# Patient Record
Sex: Female | Born: 1970 | Smoking: Never smoker
Health system: Southern US, Community
[De-identification: ages and names within clinical notes are randomized; demographics above are authoritative.]

## PROBLEM LIST (undated history)

## (undated) DIAGNOSIS — R569 Unspecified convulsions: Secondary | ICD-10-CM

## (undated) DIAGNOSIS — E119 Type 2 diabetes mellitus without complications: Secondary | ICD-10-CM

## (undated) DIAGNOSIS — I10 Essential (primary) hypertension: Secondary | ICD-10-CM

## (undated) DIAGNOSIS — E785 Hyperlipidemia, unspecified: Secondary | ICD-10-CM

## (undated) HISTORY — DX: Essential (primary) hypertension: I10

## (undated) HISTORY — PX: CHOLECYSTECTOMY: SHX55

## (undated) HISTORY — PX: TUBAL LIGATION: SHX77

## (undated) HISTORY — DX: Hyperlipidemia, unspecified: E78.5

## (undated) HISTORY — DX: Unspecified convulsions: R56.9

## (undated) HISTORY — DX: Type 2 diabetes mellitus without complications: E11.9

---

## 2009-06-14 HISTORY — PX: ESOPHAGOGASTRODUODENOSCOPY: SHX1529

## 2016-05-22 ENCOUNTER — Emergency Department: Payer: Medicare Other

## 2016-05-22 ENCOUNTER — Emergency Department
Admission: EM | Admit: 2016-05-22 | Discharge: 2016-05-22 | Disposition: A | Discharge status: Home / Self Care | Payer: Medicare Other | Attending: Emergency Medicine | Admitting: Emergency Medicine

## 2016-05-22 ENCOUNTER — Emergency Department: Payer: Self-pay

## 2016-05-22 DIAGNOSIS — E119 Type 2 diabetes mellitus without complications: Secondary | ICD-10-CM | POA: Insufficient documentation

## 2016-05-22 DIAGNOSIS — S39012A Strain of muscle, fascia and tendon of lower back, initial encounter: Secondary | ICD-10-CM | POA: Insufficient documentation

## 2016-05-22 DIAGNOSIS — S161XXA Strain of muscle, fascia and tendon at neck level, initial encounter: Secondary | ICD-10-CM | POA: Insufficient documentation

## 2016-05-22 DIAGNOSIS — S5011XA Contusion of right forearm, initial encounter: Secondary | ICD-10-CM | POA: Insufficient documentation

## 2016-05-22 DIAGNOSIS — S40021A Contusion of right upper arm, initial encounter: Secondary | ICD-10-CM

## 2016-05-22 DIAGNOSIS — Z7984 Long term (current) use of oral hypoglycemic drugs: Secondary | ICD-10-CM | POA: Insufficient documentation

## 2016-05-22 MED ORDER — KETOROLAC TROMETHAMINE 30 MG/ML IJ SOLN
60.0000 mg | Freq: Once | INTRAMUSCULAR | Status: AC
Start: 2016-05-22 — End: 2016-05-22
  Administered 2016-05-22: 60 mg via INTRAMUSCULAR
  Filled 2016-05-22: qty 2

## 2016-05-22 MED ORDER — CYCLOBENZAPRINE HCL 10 MG PO TABS
10.0000 mg | ORAL_TABLET | Freq: Every evening | ORAL | Status: AC | PRN
Start: 2016-05-22 — End: 2016-06-06

## 2016-05-22 MED ORDER — IBUPROFEN 600 MG PO TABS
600.0000 mg | ORAL_TABLET | Freq: Four times a day (QID) | ORAL | Status: AC | PRN
Start: 2016-05-22 — End: ?

## 2016-05-22 NOTE — Discharge Instructions (Signed)
Dear Ms. Crawshaw:    Thank you for choosing the [facility] Emergency Department, the premier emergency department in the [place] area.  I hope your visit today was EXCELLENT.    Specific instructions for your visit today:    Continue with pain medication and follow up with primary care provider.    Cervical Strain    You have been diagnosed with a neck strain, also called a cervical strain.    The cervical spine is between the base of the skull and the top of the shoulders.    A strain happens when a muscle is stretched, torn or injured. The pain that you feel is caused by inflammation (swelling) or bruising in the muscle. A strain is not the same as a sprain. A sprain is an injury to a ligament that holds bones together.    A cervical strain occurs when the head snaps forward during an accident or a fall. The muscles can easily be strained with this type of movement. It is normal to experience pain over the muscles around the neck but not over the bones of the cervical spine.    The x-rays of your neck showed no evidence of broken bones.    Apply a warm damp washcloth to the neck for 20 minutes at a time, at least 4 times per day. This will reduce your pain. Massaging your neck might also help.    It is normal to feel stiffness and pain in your neck after a strain. This pain may last for the next few days. If your pain stays about the same or gets better, you probably do not need to see a doctor. However, if your symptoms get worse or you have new symptoms, you should return here or go to the nearest Emergency Department.    Call your physician or go to the nearest Emergency Department if your pain does not improve within 4 weeks or your pain is bad enough to seriously limit your normal activities.    YOU SHOULD SEEK MEDICAL ATTENTION IMMEDIATELY, EITHER HERE OR AT THE NEAREST EMERGENCY DEPARTMENT, IF ANY OF THE FOLLOWING OCCURS:   Your arms and legs tingle or get numb (lose feeling).   Your  arms or legs are weak.   You feel that your neck is unstable.   You lose control of your bladder or bowels. If this were to happen, it may cause you to wet or soil yourself. Some people may actually have problems urinating instead.   Your pain gets worse.            Lumbosacral Strain    You have been diagnosed with a lumbosacral strain.    The lumbosacral area is also called the low back.    A strain happens when a muscle is stretched, torn or injured. The pain that you feel is caused by inflammation (swelling) or bruising in the muscle. A strain is not the same as a sprain. A sprain is an injury to a ligament that holds bones together.    A lumbosacral (low back) strain occurs when twisting, bending or lifting tears the muscle. This leads to stiffness and pain. It is common to experience pain over the muscles around the lower spine but not over the bones. The doctor will usually able to tell where you are tender. A low-back strain is different from a more serious condition called a herniated disk (slipped disk).    The doctor DID NOT take x-rays because your  pain was not over the bones (vertebrae) in your back. You may have had pain in the MUSCLES that surround the backbones. A fracture is very unlikely when there is no pain over the bones.    The following suggestions may help to ease the pain in your low back:   Apply a warm wet towel to the injured area for 20 minutes at a time, at least 4 times per day.   Gently massage the injured muscles to relax them and ease the pain.   Avoid any heavy lifting or repeated bending. You can resume normal daily activities as long as they do not make your pain worse.    It is common to feel stiffness and pain in the back after a strain injury. This pain may last for the next few days. You do not need to return here for this type of pain. However, you should watch for a significant change or worsening of your symptoms. Use the following as a guideline for when to  return here or go to the nearest Emergency Department:    YOU SHOULD SEEK MEDICAL ATTENTION IMMEDIATELY, EITHER HERE OR AT THE NEAREST EMERGENCY DEPARTMENT, IF ANY OF THE FOLLOWING OCCURS:   You have loss of feeling or tingling in your legs.   Your legs feel weak.   You cannot control your bowels or bladder (you soil or wet yourself).   You pain becomes more severe.   Your pain does not improve within 4 weeks or is severe enough to seriously limit your normal activities.            Contusion    You have been diagnosed with a contusion.    A contusion is a bruise. A contusion occurs when something strikes or hits the body. This breaks small blood vessels called capillaries. When the capillaries break, blood leaks out. This makes the skin look red, purple, blue, or black. The injured area may hurt for a few days. If you take a blood thinner like warfarin (Coumadin) the bruising may be worse.    Apply ice to the bruise. Avoid using the injured body part.    Apply ice to help with pain and swelling. Put some ice cubes in a re-sealable plastic bag (like Ziploc). Add some water. Seal the bag. Put a thin washcloth between the bag and the skin. Apply the ice bag for at least 20 minutes. Do this at least 4 times per day. It's okay to apply ice longer or more often. NEVER APPLY ICE DIRECTLY TO THE SKIN. Always keep a washcloth between the ice pack and your body.    YOU SHOULD SEEK MEDICAL ATTENTION IMMEDIATELY, EITHER HERE OR AT THE NEAREST EMERGENCY DEPARTMENT, IF ANY OF THE FOLLOWING OCCURS:   Your pain or swelling gets much worse.   You develop new numbness or tingling in or below the affected area.   Your foot or hand looks cold or pale. This could mean there is a problem with circulation (blood supply).                If you do not continue to improve or your condition worsens, please contact your doctor or return immediately to the Emergency Department.    Sincerely,  Aminzay, Kathrynn Speed, MD - Attending  Emergency Physician  Doristine Mango, PA-C - Physician Assistant  [facility] Emergency Department    ONSITE PHARMACY  Our full service onsite pharmacy is located in the ER waiting room.  Open  7 days a week from 9 am to 11 pm.  We accept all major insurances and prices are competitive with major retailers.  Ask your provider to print your prescriptions down to the pharmacy to speed you on your way home.    OBTAINING A PRIMARY CARE APPOINTMENT    Primary care physicians (PCPs, also known as primary care doctors) are either internists or family medicine doctors. Both types of PCPs focus on health promotion, disease prevention, patient education and counseling, and treatment of acute and chronic medical conditions.    Call for an appointment with a primary care doctor.  Ask to see who is taking new patients.     [facility]  telephone:  [phone]  [email or web address]    DOCTOR REFERRALS  Call [phone] (available 24 hours a day, 7 days a week) if you need any further referrals and we can help you find a primary care doctor or specialist.  Also, available online at:  [email or web address]    YOUR CONTACT INFORMATION  Before leaving please check with registration to make sure we have an up-to-date contact number.  You can call registration at [phone] to update your information.  For questions about your hospital bill, please call [phone].  For questions about your Emergency Dept Physician bill please call [phone].      FREE HEALTH SERVICES  If you need help with health or social services, please call 2-1-1 for a free referral to resources in your area.  2-1-1 is a free service connecting people with information on health insurance, free clinics, pregnancy, mental health, dental care, food assistance, housing, and substance abuse counseling.  Also, available online at:  http://www.[facility].org    MEDICAL RECORDS AND TESTS  Certain laboratory test results do  not come back the same day, for example urine cultures.   We will contact you if other important findings are noted.  Radiology films are often reviewed again to ensure accuracy.  If there is any discrepancy, we will notify you.      Please call [phone] to pick up a complimentary CD of any radiology studies performed.  If you or your doctor would like to request a copy of your medical records, please call [phone].      ORTHOPEDIC INJURY   Please know that significant injuries can exist even when an initial x-ray is read as normal or negative.  This can occur because some fractures (broken bones) are not initially visible on x-rays.  For this reason, close outpatient follow-up with your primary care doctor or bone specialist (orthopedist) is required.    MEDICATIONS AND FOLLOWUP  Please be aware that some prescription medications can cause drowsiness.  Use caution when driving or operating machinery.    The examination and treatment you have received in our Emergency Department is provided on an emergency basis, and is not intended to be a substitute for your primary care physician.  It is important that your doctor checks you again and that you report any new or remaining problems at that time.      24 HOUR PHARMACIES  The nearest 24 hour pharmacy is:    [facility]  [address]  [place], [place] [address]  [phone]      ASSISTANCE WITH INSURANCE    Affordable Care Act  [facility])  Call to start or finish an application, compare plans, enroll or ask a question.  [phone]  TTY: [phone]  Web:  Healthcare.gov    Help Enrolling  in [facility]  [employer] [place]  ([phone] 698-1492 (TOLL-FREE)  ([phone] (TTY)  Web:  Http://www.coverva.org    Local Help Enrolling in the [facility]  [place] [facility]  [phone] (MAIN)  Email:  health-help@nvfs .org  Web:  BlackjackMyths.is  Address:  [address], [address] [place], [place] [address]    SEDATING MEDICATIONS  Sedating  medications include strong pain medications (e.g. narcotics), muscle relaxers, benzodiazepines (used for anxiety and as muscle relaxers), Benadryl/diphenhydramine and other antihistamines for allergic reactions/itching, and other medications.  If you are unsure if you have received a sedating medication, please ask your physician or nurse.  If you received a sedating medication: DO NOT drive a car. DO NOT operate machinery. DO NOT perform jobs where you need to be alert.  DO NOT drink alcoholic beverages while taking this medicine.     If you get dizzy, sit or lie down at the first signs. Be careful going up and down stairs.  Be extra careful to prevent falls.     Never give this medicine to others.     Keep this medicine out of reach of children.     Do not take or save old medicines. Throw them away when outdated.     Keep all medicines in a cool, dry place. DO NOT keep them in your bathroom medicine cabinet or in a cabinet above the stove.    MEDICATION REFILLS  Please be aware that we cannot refill any prescriptions through the ER. If you need further treatment from what is provided at your ER visit, please follow up with your primary care doctor or your pain management specialist.    FREESTANDING EMERGENCY DEPARTMENTS OF [facility]  Did you know [facility] has two freestanding ERs located just a few miles away?  [facility] and [facility] [place] have short wait times, easy free parking directly in front of the building and top patient satisfaction scores - and the same Board Certified Emergency Medicine doctors as [facility].              Alaiya Martindelcampo Doke  [id number]  [id number]  [phone]  05/22/2016    Discharge Instructions    As always, you are the most important factor in your recovery.  Please follow these instructions carefully.  If you have problems that we have not discussed, CALL OR VISIT YOUR DOCTOR RIGHT AWAY.     If you can't reach your doctor, return to the  emergency department.    I Iran Ouch understand the written and discussed instructions.  My questions have been answered.  I acknowledge receipt of these instructions.     Patient or responsible person:         Patient's Signature               Physician or Nurse

## 2016-05-22 NOTE — ED Notes (Signed)
Bed: N T  Expected date:   Expected time:   Means of arrival: [id number] [place]  Comments:
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
## 2017-01-24 DIAGNOSIS — R05 Cough: Secondary | ICD-10-CM | POA: Diagnosis not present

## 2017-01-24 DIAGNOSIS — J069 Acute upper respiratory infection, unspecified: Secondary | ICD-10-CM | POA: Diagnosis not present

## 2017-01-24 DIAGNOSIS — R509 Fever, unspecified: Secondary | ICD-10-CM | POA: Diagnosis not present

## 2017-07-21 ENCOUNTER — Encounter (HOSPITAL_COMMUNITY): Payer: Self-pay | Admitting: Emergency Medicine

## 2017-07-21 ENCOUNTER — Emergency Department (HOSPITAL_COMMUNITY)
Admission: EM | Admit: 2017-07-21 | Discharge: 2017-07-21 | Disposition: A | Discharge status: Home / Self Care | Payer: Medicare Other | Attending: Emergency Medicine | Admitting: Emergency Medicine

## 2017-07-21 ENCOUNTER — Emergency Department (HOSPITAL_COMMUNITY): Payer: Medicare Other

## 2017-07-21 DIAGNOSIS — H60502 Unspecified acute noninfective otitis externa, left ear: Secondary | ICD-10-CM | POA: Diagnosis not present

## 2017-07-21 DIAGNOSIS — R112 Nausea with vomiting, unspecified: Secondary | ICD-10-CM | POA: Diagnosis not present

## 2017-07-21 DIAGNOSIS — R197 Diarrhea, unspecified: Secondary | ICD-10-CM | POA: Insufficient documentation

## 2017-07-21 DIAGNOSIS — H6692 Otitis media, unspecified, left ear: Secondary | ICD-10-CM | POA: Diagnosis not present

## 2017-07-21 DIAGNOSIS — H6092 Unspecified otitis externa, left ear: Secondary | ICD-10-CM | POA: Diagnosis not present

## 2017-07-21 LAB — COMPREHENSIVE METABOLIC PANEL
ALT: 21 U/L (ref 14–54)
AST: 22 U/L (ref 15–41)
Albumin: 3.5 g/dL (ref 3.5–5.0)
Alkaline Phosphatase: 69 U/L (ref 38–126)
Anion gap: 9 (ref 5–15)
BILIRUBIN TOTAL: 0.7 mg/dL (ref 0.3–1.2)
BUN: 7 mg/dL (ref 6–20)
CALCIUM: 9.3 mg/dL (ref 8.9–10.3)
CO2: 29 mmol/L (ref 22–32)
CREATININE: 0.78 mg/dL (ref 0.44–1.00)
Chloride: 102 mmol/L (ref 101–111)
GFR calc Af Amer: >60 mL/min (ref >60)
GFR calc non Af Amer: >60 mL/min (ref >60)
GLUCOSE: 134 mg/dL — AB (ref 65–99)
Potassium: 3.6 mmol/L (ref 3.5–5.1)
SODIUM: 140 mmol/L (ref 135–145)
TOTAL PROTEIN: 7.4 g/dL (ref 6.5–8.1)

## 2017-07-21 LAB — CBC
HEMATOCRIT: 38.7 % (ref 36.0–46.0)
Hemoglobin: 12.5 g/dL (ref 12.0–15.0)
MCH: 27.6 pg (ref 26.0–34.0)
MCHC: 32.3 g/dL (ref 30.0–36.0)
MCV: 85.4 fL (ref 78.0–100.0)
Platelets: 175 10*3/uL (ref 150–400)
RBC: 4.53 MIL/uL (ref 3.87–5.11)
RDW: 15.6 % — AB (ref 11.5–15.5)
WBC: 9 10*3/uL (ref 4.0–10.5)

## 2017-07-21 LAB — URINALYSIS, ROUTINE W REFLEX MICROSCOPIC
BILIRUBIN URINE: NEGATIVE
Glucose, UA: NEGATIVE mg/dL
Hgb urine dipstick: NEGATIVE
KETONES UR: NEGATIVE mg/dL
Leukocytes, UA: NEGATIVE
NITRITE: NEGATIVE
PH: 7 (ref 5.0–8.0)
Protein, ur: NEGATIVE mg/dL
Specific Gravity, Urine: 1.024 (ref 1.005–1.030)

## 2017-07-21 LAB — PREGNANCY, URINE: Preg Test, Ur: NEGATIVE

## 2017-07-21 LAB — LIPASE, BLOOD: LIPASE: 25 U/L (ref 11–51)

## 2017-07-21 MED ORDER — OFLOXACIN 0.3 % OT SOLN: 10.0000 [drp] | Freq: Every day | OTIC | 0 refills | Status: AC

## 2017-07-21 MED ORDER — PROMETHAZINE HCL 25 MG PO TABS: 25.0000 mg | ORAL_TABLET | Freq: Four times a day (QID) | ORAL | 0 refills | Status: DC | PRN

## 2017-07-21 MED ORDER — AZITHROMYCIN 250 MG PO TABS: ORAL_TABLET | ORAL | 0 refills | Status: DC

## 2017-07-21 MED ORDER — ONDANSETRON 8 MG PO TBDP
8.0000 mg | ORAL_TABLET | Freq: Once | ORAL | Status: AC
  Administered 2017-07-21: 8 mg via ORAL
  Filled 2017-07-21: qty 1

## 2017-07-21 NOTE — ED Provider Notes (Signed)
[facility]-EMERGENCY DEPT Provider Note   CSN: [id number] Arrival date & time: 07/21/17  1318     History   Chief Complaint Chief Complaint  Patient presents with  . Diarrhea  . Emesis    HPI Paula Butler is a 46 y.o. female.  HPI  Pt was seen at 1525.  Per pt, c/o gradual onset and persistence of multiple intermittent episodes of N/V/D that began 3 to 4 days ago.   Describes the stools as "watery."  Pt also c/o gradual onset and persistence of constant L>R ear "pain" that began 3 days ago "after getting wet in the rain."  Denies abd pain, no CP/SOB, no back pain, no fevers, no black or blood in stools or emesis, no sore throat, no injury, no ear drainage, no recent travel, no others in household with similar symptoms.     History reviewed. No pertinent past medical history.  There are no active problems to display for this patient.   Past Surgical History:  Procedure Laterality Date  . CHOLECYSTECTOMY    . TUBAL LIGATION      OB History    No data available       Home Medications    Prior to Admission medications   Not on File    Family History History reviewed. No pertinent family history.  Social History Social History  Substance Use Topics  . Smoking status: Never Smoker  . Smokeless tobacco: Not on file  . Alcohol use No     Allergies   Ampicillin and Asa [aspirin]   Review of Systems Review of Systems ROS: Statement: All systems negative except as marked or noted in the HPI; Constitutional: Negative for fever and chills. ; ; Eyes: Negative for eye pain, redness and discharge. ; ; ENMT: +L>R ear pain. Negative for hoarseness, nasal congestion, sinus pressure and sore throat. ; ; Cardiovascular: Negative for chest pain, palpitations, diaphoresis, dyspnea and peripheral edema. ; ; Respiratory: Negative for cough, wheezing and stridor. ; ; Gastrointestinal: +N/V/D. Negative for abdominal pain, blood in stool, hematemesis, jaundice and rectal bleeding. .  ; ; Genitourinary: Negative for dysuria, flank pain and hematuria. ; ; Musculoskeletal: Negative for back pain and neck pain. Negative for swelling and trauma.; ; Skin: Negative for pruritus, rash, abrasions, blisters, bruising and skin lesion.; ; Neuro: Negative for headache, lightheadedness and neck stiffness. Negative for weakness, altered level of consciousness, altered mental status, extremity weakness, paresthesias, involuntary movement, seizure and syncope.       Physical Exam Updated Vital Signs BP (!) 110/92 (BP Location: Right Arm)   Pulse 91   Temp 98 F (36.7 C) (Oral)   Resp 18   Ht 5\' 5"  (1.651 m)   Wt 113.4 kg (250 lb)   LMP 07/07/2017 (Exact Date)   SpO2 94%   BMI 41.60 kg/m   Physical Exam 1530: Physical examination:  Nursing notes reviewed; Vital signs and O2 SAT reviewed;  Constitutional: Well developed, Well nourished, Well hydrated, In no acute distress; Head:  Normocephalic, atraumatic; Eyes: EOMI, PERRL, No scleral icterus; ENMT:  Right TM clear. Left TM erythematous, intact, small amount of debris in canal, no open wounds, no blisters, no bleeding. No mastoid tenderness bilat. +edemetous nasal turbinates bilat with clear rhinorrhea. Mouth and pharynx without lesions. No tonsillar exudates. No intra-oral edema. No submandibular or sublingual edema. No hoarse voice, no drooling, no stridor. No pain with manipulation of larynx. No trismus.  Mouth and pharynx normal, Mucous membranes moist; Neck: Supple, Full  range of motion, No lymphadenopathy; Cardiovascular: Regular rate and rhythm, No gallop; Respiratory: Breath sounds clear & equal bilaterally, No wheezes.  Speaking full sentences with ease, Normal respiratory effort/excursion; Chest: Nontender, Movement normal; Abdomen: Soft, Nontender, Nondistended, Normal bowel sounds; Genitourinary: No CVA tenderness; Extremities: Pulses normal, No tenderness, No edema, No calf edema or asymmetry.; Neuro: AA&Ox3, Major CN grossly  intact.  Speech clear. No gross focal motor or sensory deficits in extremities. Climbs on and off stretcher easily by herself. Gait steady.; Skin: Color normal, Warm, Dry.    ED Treatments / Results  Labs (all labs ordered are listed, but only abnormal results are displayed)   EKG  EKG Interpretation None       Radiology   Procedures Procedures (including critical care time)  Medications Ordered in ED Medications  ondansetron (ZOFRAN-ODT) disintegrating tablet 8 mg (8 mg Oral Given 07/21/17 1536)     Initial Impression / Assessment and Plan / ED Course  I have reviewed the triage vital signs and the nursing notes.  Pertinent labs & imaging results that were available during my care of the patient were reviewed by me and considered in my medical decision making (see chart for details).  MDM Reviewed: previous chart, nursing note and vitals Reviewed previous: labs Interpretation: labs and x-ray   Results for orders placed or performed during the hospital encounter of 07/21/17  Lipase, blood  Result Value Ref Range   Lipase 25 11 - 51 U/L  Comprehensive metabolic panel  Result Value Ref Range   Sodium 140 135 - 145 mmol/L   Potassium 3.6 3.5 - 5.1 mmol/L   Chloride 102 101 - 111 mmol/L   CO2 29 22 - 32 mmol/L   Glucose, Bld 134 (H) 65 - 99 mg/dL   BUN 7 6 - 20 mg/dL   Creatinine, Ser 9.140.78 0.44 - 1.00 mg/dL   Calcium 9.3 8.9 - 78.210.3 mg/dL   Total Protein 7.4 6.5 - 8.1 g/dL   Albumin 3.5 3.5 - 5.0 g/dL   AST 22 15 - 41 U/L   ALT 21 14 - 54 U/L   Alkaline Phosphatase 69 38 - 126 U/L   Total Bilirubin 0.7 0.3 - 1.2 mg/dL   GFR calc non Af Amer >60 >60 mL/min   GFR calc Af Amer >60 >60 mL/min   Anion gap 9 5 - 15  CBC  Result Value Ref Range   WBC 9.0 4.0 - 10.5 K/uL   RBC 4.53 3.87 - 5.11 MIL/uL   Hemoglobin 12.5 12.0 - 15.0 g/dL   HCT 95.638.7 21.336.0 - 08.646.0 %   MCV 85.4 78.0 - 100.0 fL   MCH 27.6 26.0 - 34.0 pg   MCHC 32.3 30.0 - 36.0 g/dL   RDW 57.815.6 (H) 46.911.5 -  15.5 %   Platelets 175 150 - 400 K/uL  Urinalysis, Routine w reflex microscopic  Result Value Ref Range   Color, Urine YELLOW YELLOW   APPearance HAZY (A) CLEAR   Specific Gravity, Urine 1.024 1.005 - 1.030   pH 7.0 5.0 - 8.0   Glucose, UA NEGATIVE NEGATIVE mg/dL   Hgb urine dipstick NEGATIVE NEGATIVE   Bilirubin Urine NEGATIVE NEGATIVE   Ketones, ur NEGATIVE NEGATIVE mg/dL   Protein, ur NEGATIVE NEGATIVE mg/dL   Nitrite NEGATIVE NEGATIVE   Leukocytes, UA NEGATIVE NEGATIVE  Pregnancy, urine  Result Value Ref Range   Preg Test, Ur NEGATIVE NEGATIVE   Dg Abd Acute W/chest Result Date: 07/21/2017 CLINICAL DATA:  Nausea,  vomiting and diarrhea for 4 days. History of cholecystectomy. EXAM: DG ABDOMEN ACUTE W/ 1V CHEST COMPARISON:  None. FINDINGS: Cardiomediastinal silhouette is normal. Lungs are clear, no pleural effusions. No pneumothorax. Soft tissue planes and included osseous structures are unremarkable. Bowel gas pattern is nondilated and nonobstructive. Surgical clips in the included right abdomen compatible with cholecystectomy. No intra-abdominal mass effect, pathologic calcifications or free air. Phleboliths project in the pelvis. Soft tissue planes and included osseous structures are non-suspicious. IMPRESSION: No acute cardiopulmonary process. Normal bowel gas pattern. Electronically Signed   By: Awilda Metro M.D.   On: 07/21/2017 16:49    1820:  Pt has tol PO well while in the ED without N/V.  Has ambulated with steady gait. Abd remains benign, VSS. Feels better and wants to go home now. Workup reassuring. Tx for AOM, external otitis, as well as symptomatically for N/V/D. Dx and testing d/w pt and family.  Questions answered.  Verb understanding, agreeable to d/c home with outpt f/u.    Final Clinical Impressions(s) / ED Diagnoses   Final diagnoses:  None    New Prescriptions New Prescriptions   No medications on file     Samuel Jester, DO 07/25/17 2130

## 2017-07-21 NOTE — Discharge Instructions (Signed)
Take the prescriptions as directed.  Take over the counter tylenol and ibuprofen, as directed on packaging, as needed for discomfort.  Increase your fluid intake (ie:  Gatoraide) for the next few days.  Eat a bland diet and advance to your regular diet slowly as you can tolerate it.   Avoid full strength juices, as well as milk and milk products until your diarrhea has resolved.   Call your regular medical doctor tomorrow to schedule a follow up appointment this week.  Return to the Emergency Department immediately sooner if worsening.

## 2017-07-21 NOTE — ED Triage Notes (Signed)
Pt reports n/v/d and bilateral ear pain x3 days.  Denies blood in stool, abd pain, or urinary symptoms.

## 2017-07-21 NOTE — ED Notes (Signed)
Pt. Tolerated PO challenge well. Pt. States she went to the bathroom and had diarrhea X1.

## 2017-10-03 ENCOUNTER — Emergency Department (HOSPITAL_COMMUNITY): Payer: Medicare Other

## 2017-10-03 ENCOUNTER — Other Ambulatory Visit: Payer: Self-pay

## 2017-10-03 ENCOUNTER — Encounter (HOSPITAL_COMMUNITY): Payer: Self-pay | Admitting: Emergency Medicine

## 2017-10-03 ENCOUNTER — Emergency Department (HOSPITAL_COMMUNITY)
Admission: EM | Admit: 2017-10-03 | Discharge: 2017-10-03 | Disposition: A | Discharge status: Home / Self Care | Payer: Medicare Other | Attending: Emergency Medicine | Admitting: Emergency Medicine

## 2017-10-03 DIAGNOSIS — R1110 Vomiting, unspecified: Secondary | ICD-10-CM | POA: Diagnosis not present

## 2017-10-03 DIAGNOSIS — R0602 Shortness of breath: Secondary | ICD-10-CM | POA: Insufficient documentation

## 2017-10-03 DIAGNOSIS — E119 Type 2 diabetes mellitus without complications: Secondary | ICD-10-CM | POA: Insufficient documentation

## 2017-10-03 DIAGNOSIS — R0981 Nasal congestion: Secondary | ICD-10-CM | POA: Insufficient documentation

## 2017-10-03 DIAGNOSIS — M549 Dorsalgia, unspecified: Secondary | ICD-10-CM | POA: Diagnosis not present

## 2017-10-03 DIAGNOSIS — Z7984 Long term (current) use of oral hypoglycemic drugs: Secondary | ICD-10-CM | POA: Insufficient documentation

## 2017-10-03 DIAGNOSIS — J40 Bronchitis, not specified as acute or chronic: Secondary | ICD-10-CM | POA: Insufficient documentation

## 2017-10-03 DIAGNOSIS — R05 Cough: Secondary | ICD-10-CM | POA: Diagnosis not present

## 2017-10-03 DIAGNOSIS — J069 Acute upper respiratory infection, unspecified: Secondary | ICD-10-CM | POA: Insufficient documentation

## 2017-10-03 HISTORY — DX: Type 2 diabetes mellitus without complications: E11.9

## 2017-10-03 LAB — CBC WITH DIFFERENTIAL/PLATELET
BASOS ABS: 0 10*3/uL (ref 0.0–0.1)
BASOS PCT: 0 %
Eosinophils Absolute: 0.4 10*3/uL (ref 0.0–0.7)
Eosinophils Relative: 5 %
HEMATOCRIT: 39.2 % (ref 36.0–46.0)
HEMOGLOBIN: 12.7 g/dL (ref 12.0–15.0)
LYMPHS PCT: 18 %
Lymphs Abs: 1.5 10*3/uL (ref 0.7–4.0)
MCH: 28 pg (ref 26.0–34.0)
MCHC: 32.4 g/dL (ref 30.0–36.0)
MCV: 86.3 fL (ref 78.0–100.0)
MONOS PCT: 8 %
Monocytes Absolute: 0.6 10*3/uL (ref 0.1–1.0)
NEUTROS ABS: 5.4 10*3/uL (ref 1.7–7.7)
NEUTROS PCT: 69 %
Platelets: 150 10*3/uL (ref 150–400)
RBC: 4.54 MIL/uL (ref 3.87–5.11)
RDW: 15.7 % — ABNORMAL HIGH (ref 11.5–15.5)
WBC: 8 10*3/uL (ref 4.0–10.5)

## 2017-10-03 LAB — BASIC METABOLIC PANEL
ANION GAP: 9 (ref 5–15)
BUN: 14 mg/dL (ref 6–20)
CO2: 26 mmol/L (ref 22–32)
Calcium: 9.1 mg/dL (ref 8.9–10.3)
Chloride: 104 mmol/L (ref 101–111)
Creatinine, Ser: 0.81 mg/dL (ref 0.44–1.00)
Glucose, Bld: 99 mg/dL (ref 65–99)
POTASSIUM: 4 mmol/L (ref 3.5–5.1)
Sodium: 139 mmol/L (ref 135–145)

## 2017-10-03 LAB — URINALYSIS, ROUTINE W REFLEX MICROSCOPIC
Bilirubin Urine: NEGATIVE
Glucose, UA: NEGATIVE mg/dL
Hgb urine dipstick: NEGATIVE
Ketones, ur: NEGATIVE mg/dL
LEUKOCYTES UA: NEGATIVE
NITRITE: NEGATIVE
PROTEIN: NEGATIVE mg/dL
Specific Gravity, Urine: 1.027 (ref 1.005–1.030)
pH: 5 (ref 5.0–8.0)

## 2017-10-03 LAB — PREGNANCY, URINE: PREG TEST UR: NEGATIVE

## 2017-10-03 MED ORDER — ALBUTEROL SULFATE HFA 108 (90 BASE) MCG/ACT IN AERS
2.0000 | INHALATION_SPRAY | Freq: Once | RESPIRATORY_TRACT | Status: AC
  Administered 2017-10-03: 2 via RESPIRATORY_TRACT
  Filled 2017-10-03: qty 6.7

## 2017-10-03 MED ORDER — LORATADINE-PSEUDOEPHEDRINE ER 5-120 MG PO TB12: 1.0000 | ORAL_TABLET | Freq: Two times a day (BID) | ORAL | 0 refills | Status: DC

## 2017-10-03 MED ORDER — HYDROCOD POLST-CPM POLST ER 10-8 MG/5ML PO SUER
5.0000 mL | Freq: Once | ORAL | Status: AC
  Administered 2017-10-03: 5 mL via ORAL
  Filled 2017-10-03: qty 5

## 2017-10-03 MED ORDER — HYDROCODONE-HOMATROPINE 5-1.5 MG/5ML PO SYRP: 5.0000 mL | ORAL_SOLUTION | Freq: Four times a day (QID) | ORAL | 0 refills | Status: DC | PRN

## 2017-10-03 MED ORDER — METHYLPREDNISOLONE SODIUM SUCC 125 MG IJ SOLR
80.0000 mg | Freq: Once | INTRAMUSCULAR | Status: AC
  Administered 2017-10-03: 80 mg via INTRAMUSCULAR
  Filled 2017-10-03: qty 2

## 2017-10-03 MED ORDER — ONDANSETRON 4 MG PO TBDP: 4.0000 mg | ORAL_TABLET | Freq: Four times a day (QID) | ORAL | 0 refills | Status: DC | PRN

## 2017-10-03 NOTE — ED Provider Notes (Signed)
[facility] EMERGENCY DEPARTMENT Provider Note   CSN: [id number] Arrival date & time: 10/03/17  1254     History   Chief Complaint Chief Complaint  Patient presents with  . Multiple Complaints    HPI Paula Butler is a 46 y.o. female.  Patient is a 47 year old female who presents to the emergency department with a complaint of cough, congestion, and back pain.  The patient states that over the last 4 days she's been noticing increasing congestion in her chest. She has congestion in her nasal passages. She states that at night she has hard breathing. She also complains of pain now in her back. She has not injured a temperature elevation. She's not had any hemoptysis reported. There's been no injury or trauma to the chest or lung area. No operations or procedures. The patient states she has been vomiting 3-4 times daily. There's been no diarrhea and no unusual rash. Patient states she has not been out of the country recently. She presents now for assistance with this issue.      Past Medical History:  Diagnosis Date  . Diabetes mellitus without complication (HCC)     There are no active problems to display for this patient.   Past Surgical History:  Procedure Laterality Date  . CHOLECYSTECTOMY    . TUBAL LIGATION      OB History    No data available       Home Medications    Prior to Admission medications   Medication Sig Start Date End Date Taking? Authorizing Provider  acetaminophen (TYLENOL) 500 MG tablet Take 500 mg by mouth every 6 (six) hours as needed for mild pain or moderate pain.    [provider]  azithromycin (ZITHROMAX) 250 MG tablet Take 2 tablets PO day 1, then 1 tab PO daily x4 days. 07/21/17   Samuel Jester, DO  metFORMIN (GLUCOPHAGE) 500 MG tablet Take 500 mg by mouth 2 (two) times daily with a meal.    [provider]  promethazine (PHENERGAN) 25 MG tablet Take 1 tablet (25 mg total) by mouth every 6 (six) hours as needed  for nausea or vomiting. 07/21/17   Samuel Jester, DO    Family History History reviewed. No pertinent family history.  Social History Social History  Substance Use Topics  . Smoking status: Never Smoker  . Smokeless tobacco: Never Used  . Alcohol use No     Allergies   Asa [aspirin] and Penicillins   Review of Systems Review of Systems  Constitutional: Negative for activity change and fever.       All ROS Neg except as noted in HPI  HENT: Positive for congestion. Negative for nosebleeds.   Eyes: Negative for photophobia and discharge.  Respiratory: Positive for cough and shortness of breath. Negative for wheezing.   Cardiovascular: Negative for chest pain and palpitations.  Gastrointestinal: Negative for abdominal pain and blood in stool.  Genitourinary: Negative for dysuria, frequency and hematuria.  Musculoskeletal: Negative for arthralgias, back pain and neck pain.  Skin: Negative.   Neurological: Negative for dizziness, seizures and speech difficulty.  Psychiatric/Behavioral: Negative for confusion and hallucinations.     Physical Exam Updated Vital Signs BP 120/82 (BP Location: Right Arm)   Pulse 78   Temp 98.4 F (36.9 C) (Oral)   Resp (!) 22   Ht 5\' 5"  (1.651 m)   Wt 113.4 kg (250 lb)   LMP 10/03/2017   SpO2 94%   BMI 41.60 kg/m   Physical  Exam  Constitutional: She is oriented to person, place, and time. She appears well-developed and well-nourished.  Non-toxic appearance.  HENT:  Head: Normocephalic.  Right Ear: Tympanic membrane and external ear normal.  Left Ear: Tympanic membrane and external ear normal.  Nasal congestion present.  Eyes: Pupils are equal, round, and reactive to light. EOM and lids are normal.  Neck: Normal range of motion. Neck supple. Carotid bruit is not present.  Cardiovascular: Normal rate, regular rhythm, normal heart sounds, intact distal pulses and normal pulses.  Exam reveals no gallop and no friction rub.   No murmur  heard. Pulmonary/Chest: Effort normal and breath sounds normal. No respiratory distress. She has no wheezes. She has no rales.  Rhonchi present. No use of assessory muscles. Pt speaks in complete sentences.  Abdominal: Soft. Bowel sounds are normal. There is no tenderness. There is no guarding.  Musculoskeletal: Normal range of motion. She exhibits no edema or tenderness.  Lymphadenopathy:       Head (right side): No submandibular adenopathy present.       Head (left side): No submandibular adenopathy present.    She has no cervical adenopathy.  Neurological: She is alert and oriented to person, place, and time. She has normal strength. No cranial nerve deficit or sensory deficit.  Skin: Skin is warm and dry. No rash noted.  Psychiatric: She has a normal mood and affect. Her speech is normal.  Nursing note and vitals reviewed.    ED Treatments / Results  Labs (all labs ordered are listed, but only abnormal results are displayed) Labs Reviewed  CBC WITH DIFFERENTIAL/PLATELET - Abnormal; Notable for the following:       Result Value   RDW 15.7 (*)    All other components within normal limits  BASIC METABOLIC PANEL  URINALYSIS, ROUTINE W REFLEX MICROSCOPIC  POC URINE PREG, ED    EKG  EKG Interpretation  Date/Time:  Friday October 03 2017 13:05:26 EDT Ventricular Rate:  81 PR Interval:  132 QRS Duration: 72 QT Interval:  390 QTC Calculation: 453 R Axis:   78 Text Interpretation:  Normal sinus rhythm Normal ECG No old tracing to compare Confirmed by Marily Memos [phone]) on 10/03/2017 4:14:00 PM       Radiology Dg Chest 2 View  Result Date: 10/03/2017 CLINICAL DATA:  Cough and congestion.  Fever. EXAM: CHEST  2 VIEW COMPARISON:  July 21, 2017 FINDINGS: Lungs are clear. Heart size and pulmonary vascularity are normal. No adenopathy. No bone lesions. IMPRESSION: No edema or consolidation. Electronically Signed   By: Bretta Bang III M.D.   On: 10/03/2017 13:26     Procedures Procedures (including critical care time)  Medications Ordered in ED Medications - No data to display   Initial Impression / Assessment and Plan / ED Course  I have reviewed the triage vital signs and the nursing notes.  Pertinent labs & imaging results that were available during my care of the patient were reviewed by me and considered in my medical decision making (see chart for details).       Final Clinical Impressions(s) / ED Diagnoses MDM Vital signs within normal limits. Electrocardiogram shows normal sinus rhythm. Chest x-ray is negative for any focal consolidation, edema, or any acute problem. The complete blood count and comprehensive metabolic panel are all within normal limits. Urinalysis is negative for kidney stone or urinary tract infection.  Patient breathing easier after albuterol inhaler. The examination favors bronchitis and upper respiratory infection. I've asked patient  to use Tylenol every 4 hours and to increase fluids. The patient will be treated with Zofran for nausea, Hycodan for cough , and Claritin-D for congestion. Patient is to see the primary physician or return to the emergency department if any changes, problems, or concerns.    Final diagnoses:  Bronchitis  Upper respiratory tract infection, unspecified type    New Prescriptions New Prescriptions   HYDROCODONE-HOMATROPINE (HYCODAN) 5-1.5 MG/5ML SYRUP    Take 5 mLs by mouth every 6 (six) hours as needed.   LORATADINE-PSEUDOEPHEDRINE (CLARITIN-D 12 HOUR) 5-120 MG TABLET    Take 1 tablet by mouth 2 (two) times daily.   ONDANSETRON (ZOFRAN ODT) 4 MG DISINTEGRATING TABLET    Take 1 tablet (4 mg total) by mouth every 6 (six) hours as needed for nausea or vomiting.     Ivery QualeBryant, Indie Boehne, PA-C 10/03/17 1902    Marily MemosMesner, Jason, MD 10/06/17 [phone]

## 2017-10-03 NOTE — ED Triage Notes (Addendum)
Patient complaining of chest pain, cough, nausea, vomiting, and back pain x 4 days. States family member had same symptoms.

## 2017-10-03 NOTE — ED Notes (Signed)
ED Provider at bedside. 

## 2017-10-03 NOTE — Discharge Instructions (Signed)
Your vital signs within normal limits. Your oxygen level is 98% on room air. Which is within normal limits. Your blood work is normal. Your chest x-ray is also normal. Your examination suggest bronchitis and upper respiratory infection. Please use Tylenol every 4 hours for fever or aching. Use Zofran for nausea or vomiting. Please increase your fluids. Wash hands frequently. Use Hycodan for cough.This medication may cause drowsiness. Please do not drink, drive, or participate in activity that requires concentration while taking this medication. Please see your primary physician or return to the emergency department if not improving.

## 2017-11-11 DIAGNOSIS — Z23 Encounter for immunization: Secondary | ICD-10-CM | POA: Diagnosis not present

## 2017-12-19 ENCOUNTER — Other Ambulatory Visit: Payer: Self-pay

## 2017-12-19 ENCOUNTER — Encounter (HOSPITAL_COMMUNITY): Payer: Self-pay | Admitting: *Deleted

## 2017-12-19 DIAGNOSIS — Z5321 Procedure and treatment not carried out due to patient leaving prior to being seen by health care provider: Secondary | ICD-10-CM | POA: Insufficient documentation

## 2017-12-19 DIAGNOSIS — R109 Unspecified abdominal pain: Secondary | ICD-10-CM | POA: Diagnosis present

## 2017-12-19 LAB — BASIC METABOLIC PANEL
ANION GAP: 11 (ref 5–15)
BUN: 16 mg/dL (ref 6–20)
CALCIUM: 9.4 mg/dL (ref 8.9–10.3)
CHLORIDE: 103 mmol/L (ref 101–111)
CO2: 27 mmol/L (ref 22–32)
Creatinine, Ser: 0.8 mg/dL (ref 0.44–1.00)
GFR calc non Af Amer: >60 mL/min (ref >60)
Glucose, Bld: 112 mg/dL — ABNORMAL HIGH (ref 65–99)
POTASSIUM: 3.8 mmol/L (ref 3.5–5.1)
Sodium: 141 mmol/L (ref 135–145)

## 2017-12-19 LAB — URINALYSIS, ROUTINE W REFLEX MICROSCOPIC
Bilirubin Urine: NEGATIVE
Glucose, UA: NEGATIVE mg/dL
HGB URINE DIPSTICK: NEGATIVE
Ketones, ur: NEGATIVE mg/dL
Nitrite: NEGATIVE
PROTEIN: NEGATIVE mg/dL
Specific Gravity, Urine: 1.012 (ref 1.005–1.030)
pH: 6 (ref 5.0–8.0)

## 2017-12-19 LAB — CBC
HEMATOCRIT: 40.2 % (ref 36.0–46.0)
HEMOGLOBIN: 13 g/dL (ref 12.0–15.0)
MCH: 28.3 pg (ref 26.0–34.0)
MCHC: 32.3 g/dL (ref 30.0–36.0)
MCV: 87.6 fL (ref 78.0–100.0)
Platelets: 175 10*3/uL (ref 150–400)
RBC: 4.59 MIL/uL (ref 3.87–5.11)
RDW: 15.2 % (ref 11.5–15.5)
WBC: 10 10*3/uL (ref 4.0–10.5)

## 2017-12-19 LAB — PREGNANCY, URINE: PREG TEST UR: NEGATIVE

## 2017-12-19 LAB — LIPASE, BLOOD: Lipase: 35 U/L (ref 11–51)

## 2017-12-19 NOTE — ED Triage Notes (Addendum)
Pt reports right sided flank pain around 6pm tonight. Pt reports nausea and 5 episodes of diarrhea. Pt denies any other urinary symptoms.

## 2017-12-19 NOTE — ED Notes (Signed)
Called x 1 not in waiting area 

## 2017-12-20 ENCOUNTER — Emergency Department (HOSPITAL_COMMUNITY)
Admission: EM | Admit: 2017-12-20 | Discharge: 2017-12-20 | Disposition: A | Discharge status: Home / Self Care | Payer: Medicare Other | Attending: Emergency Medicine | Admitting: Emergency Medicine

## 2017-12-20 DIAGNOSIS — R1031 Right lower quadrant pain: Secondary | ICD-10-CM | POA: Diagnosis not present

## 2017-12-20 DIAGNOSIS — E119 Type 2 diabetes mellitus without complications: Secondary | ICD-10-CM | POA: Diagnosis not present

## 2017-12-20 DIAGNOSIS — N83202 Unspecified ovarian cyst, left side: Secondary | ICD-10-CM | POA: Diagnosis not present

## 2017-12-20 DIAGNOSIS — Z7984 Long term (current) use of oral hypoglycemic drugs: Secondary | ICD-10-CM | POA: Diagnosis not present

## 2017-12-20 DIAGNOSIS — R1030 Lower abdominal pain, unspecified: Secondary | ICD-10-CM | POA: Diagnosis not present

## 2017-12-20 DIAGNOSIS — M545 Low back pain: Secondary | ICD-10-CM | POA: Diagnosis not present

## 2017-12-20 NOTE — ED Notes (Addendum)
Pt walked off unit with spouse AMA

## 2017-12-20 NOTE — ED Notes (Signed)
Pt's husband would like to know when is the doctor coming in to see his wife, pts husband stated that he has been waiting for over seven hours as of now and is in a lot of pain.

## 2018-01-28 DIAGNOSIS — E119 Type 2 diabetes mellitus without complications: Secondary | ICD-10-CM | POA: Diagnosis not present

## 2018-01-28 DIAGNOSIS — Z7984 Long term (current) use of oral hypoglycemic drugs: Secondary | ICD-10-CM | POA: Diagnosis not present

## 2018-01-28 DIAGNOSIS — R221 Localized swelling, mass and lump, neck: Secondary | ICD-10-CM | POA: Diagnosis not present

## 2018-01-28 DIAGNOSIS — M542 Cervicalgia: Secondary | ICD-10-CM | POA: Diagnosis not present

## 2018-01-29 DIAGNOSIS — R221 Localized swelling, mass and lump, neck: Secondary | ICD-10-CM | POA: Diagnosis not present

## 2018-01-29 DIAGNOSIS — M542 Cervicalgia: Secondary | ICD-10-CM | POA: Diagnosis not present

## 2018-02-06 ENCOUNTER — Telehealth: Payer: Self-pay | Admitting: Family Medicine

## 2018-02-06 NOTE — Telephone Encounter (Signed)
Called to move appt up per cancellation list, no answer, left voicemail.

## 2018-02-27 ENCOUNTER — Encounter: Payer: Self-pay | Admitting: Family Medicine

## 2018-02-27 ENCOUNTER — Other Ambulatory Visit: Payer: Self-pay

## 2018-02-27 ENCOUNTER — Ambulatory Visit (INDEPENDENT_AMBULATORY_CARE_PROVIDER_SITE_OTHER): Payer: Medicare Other | Admitting: Family Medicine

## 2018-02-27 VITALS — BP 126/84 | HR 78 | Temp 98.0°F | Resp 16 | Ht 65.0 in | Wt 251.8 lb

## 2018-02-27 DIAGNOSIS — R5383 Other fatigue: Secondary | ICD-10-CM | POA: Diagnosis not present

## 2018-02-27 DIAGNOSIS — N912 Amenorrhea, unspecified: Secondary | ICD-10-CM

## 2018-02-27 DIAGNOSIS — E039 Hypothyroidism, unspecified: Secondary | ICD-10-CM

## 2018-02-27 DIAGNOSIS — R222 Localized swelling, mass and lump, trunk: Secondary | ICD-10-CM

## 2018-02-27 DIAGNOSIS — E119 Type 2 diabetes mellitus without complications: Secondary | ICD-10-CM | POA: Diagnosis not present

## 2018-02-27 DIAGNOSIS — Z113 Encounter for screening for infections with a predominantly sexual mode of transmission: Secondary | ICD-10-CM | POA: Diagnosis not present

## 2018-02-27 DIAGNOSIS — Z23 Encounter for immunization: Secondary | ICD-10-CM | POA: Diagnosis not present

## 2018-02-27 DIAGNOSIS — J302 Other seasonal allergic rhinitis: Secondary | ICD-10-CM

## 2018-02-27 DIAGNOSIS — N6452 Nipple discharge: Secondary | ICD-10-CM

## 2018-02-27 LAB — POCT URINE PREGNANCY: Preg Test, Ur: NEGATIVE

## 2018-02-27 MED ORDER — LEVOCETIRIZINE DIHYDROCHLORIDE 5 MG PO TABS: 5.0000 mg | ORAL_TABLET | Freq: Every evening | ORAL | 0 refills | Status: DC

## 2018-02-27 NOTE — Progress Notes (Signed)
Patient ID: Paula Butler, female    DOB: [birth year], 47 y.o.   MRN: [id number]  Chief Complaint  Patient presents with  . Cyst    on left neck/shoulder  . Menopause    sweating too much, hot, headaches  . Labs Only    patient would like labs to check thyroid  . Painful intercourse  . Allergies    seasonal  . Breast Discharge    Allergies Asa [aspirin] and Penicillins  Subjective:   Paula Butler is a 47 y.o. female who presents to [facility] today.  HPI Paula Butler is a 47 year old Hispanic female who presents as a new patient visit today for evaluation.  She has multiple complaints today in the office and concerns.  1.  She reports that she has had left-sided nipple discharge for the past 8 months.  She reports that she has had some associated pain in her left breast which occurs sporadically.  She believes she might have felt in the area that was tender and enlarged in the left breast several months ago.  She denies any skin changes in the breast.  She has not felt any lymph node enlargement.  She does report that the discharge in the left breast is been sporadic over the past 8 months.  She reports that some days she sees the drainage on her bra and some days she has visualized the discharge coming out of her nipple.  She reports that is brownish in color.  She has no personal history of malignancy.  She has never had a mammogram performed.  She does report that she has a family history of breast cancer.  2.  She reports that over the past many months that her energy has been low and she has gained over 30 pounds.  She reports that over a year ago she quit taking her thyroid medication because she ran out of pills.  She reports that she was placed on the pills many years ago due to a nodule in her thyroid.  She reports that she did not have surgery on her thyroid but she was told that she needed to follow the nodule.  She has not had any recent ultrasound  evaluation of her thyroid.  She reports that she can swallow fine at this time but previously could not.  3. She reports that she is concerned about a lump in the left side of her lower neck.  She reports this is been there for a month.  She reports that she went to the emergency department because of this and they told her that she had a sore throat.  They did perform a chest x-ray at that time.  She reports that the area in the left side of her neck seems to get larger and smaller.  It is tender at times.  There is been no overlying skin erythema or irritation.  She does not report any other lumps or lesions in any other part of her body.  She can move her neck okay.  She denies any neck stiffness.  She denies any drainage from the skin.  4.  She does report that she is diabetic.  She reports that she has been taking the medication as directed but she has not had any blood sugars checked in a long time.  She denies any lesions on her feet.  She reports that she has not had an eye exam.  She denies any history of kidney problems.  She  reports she has never had her cholesterol checked.  Has not had an eye exam.  Denies any hypoglycemic episodes.  5.  She reports that she has not had a period in several months.  She reports that over the past several months she has experienced vaginal dryness and some hot flashes.  She has had a history of a tubal ligation.  She denies any pelvic pain.  She denies any postcoital bleeding.  She reports that over the past several months intercourse has been more painful.  She believes this is secondary to the dryness in her vagina.  She has no history of abnormal Pap smear.  6.  She request she would also like a refill on her allergy medicine.  Has had rhinorrhea, sneezing, intermittent nasal congestion.  No history of asthma.  Does not cough with her allergy symptoms.  Reports she has been given montelukast in the past.  Has never had to use an inhaler.  Has never been  hospitalized for asthma.  Denies any sinus pain or congestion at this time. She reports that she has never had any vaccinations.  She reports that her mood is good.  She denies any depression.  She has a strong faith.  She reports that her energy level has been lower over the past several months.     Past Medical History:  Diagnosis Date  . Diabetes mellitus without complication (HCC)   . Hyperlipidemia     Past Surgical History:  Procedure Laterality Date  . CHOLECYSTECTOMY    . TUBAL LIGATION      Family History  Problem Relation Age of Onset  . Diabetes Mother   . Hypothyroidism Mother   . Prostate cancer Father   . Breast cancer Sister   . Stroke Brother      Social History   Socioeconomic History  . Marital status: Married    Spouse name: None  . Number of children: None  . Years of education: None  . Highest education level: None  Social Needs  . Financial resource strain: None  . Food insecurity - worry: None  . Food insecurity - inability: None  . Transportation needs - medical: None  . Transportation needs - non-medical: None  Occupational History  . None  Tobacco Use  . Smoking status: Never Smoker  . Smokeless tobacco: Never Used  Substance and Sexual Activity  . Alcohol use: No  . Drug use: No  . Sexual activity: Yes    Partners: Male    Birth control/protection: Surgical  Other Topics Concern  . None  Social History Narrative   Married for 10 years, 3 children, all grown.    2 daughters, 1 son.   Attends church.    Walk.   Sings.   Eats all food groups.   Wears seatbelt.   Born in [place], grew up in [place]. Father in [employer]. Grandmother raised her.    Moved from [place] in 2018.    Does not work out side of home.    Current Outpatient Medications on File Prior to Visit  Medication Sig Dispense Refill  . metFORMIN (GLUCOPHAGE) 500 MG tablet Take 500 mg by mouth 2 (two) times daily with a meal.     No current facility-administered  medications on file prior to visit.     Review of Systems  Constitutional: Negative for activity change, appetite change and fever.  HENT: Positive for congestion and sneezing. Negative for dental problem, ear pain, mouth sores, postnasal drip, rhinorrhea,  sinus pressure, sinus pain, sore throat, trouble swallowing and voice change.        Feels like meat gets stuck in throat. Has quit eating meat two months ago b/c of this. No dyspepsia.   Eyes: Negative for visual disturbance.  Respiratory: Negative for cough, chest tightness, shortness of breath and wheezing.   Cardiovascular: Negative for chest pain, palpitations and leg swelling.  Gastrointestinal: Negative for abdominal distention, abdominal pain, blood in stool, constipation, diarrhea, nausea, rectal pain and vomiting.       BM regular. Feels that for the past month has been bloated. Reports that after eating feels bloated and stomach hurts. Will stay bloated for 2-3 days. Reports that abdomen looks like she is pregnant.   Endocrine: Negative for polydipsia, polyphagia and polyuria.       Used to be a medication for thyroid but quit the medication in 04/2017. Has gained about 30 pounds since stopping the medication. Stopped the medication b/c she ran out of it.   Genitourinary: Positive for dyspareunia and menstrual problem. Negative for decreased urine volume, difficulty urinating, dysuria, frequency, genital sores, hematuria, urgency, vaginal bleeding and vaginal discharge.       No menses since December. Has had some vaginal dryness. No dysuria.  Having some hotflashes during the day and night.   Musculoskeletal: Negative for arthralgias and neck pain.       Left neck/side of neck has had a "cyst" has been there for a month and it has been, gets big and then gets smaller.   Skin: Negative for rash.  Neurological: Negative for dizziness, tremors, syncope and light-headedness.  Hematological: Negative for adenopathy. Does not bruise/bleed  easily.  Psychiatric/Behavioral: Negative for dysphoric mood, sleep disturbance and suicidal ideas. The patient is not nervous/anxious.    Current Outpatient Medications on File Prior to Visit  Medication Sig Dispense Refill  . metFORMIN (GLUCOPHAGE) 500 MG tablet Take 500 mg by mouth 2 (two) times daily with a meal.     No current facility-administered medications on file prior to visit.      Objective:   BP 126/84 (BP Location: Left Arm, Patient Position: Sitting, Cuff Size: Normal)   Pulse 78   Temp 98 F (36.7 C) (Temporal)   Resp 16   Ht 5\' 5"  (1.651 m)   Wt 251 lb 12 oz (114.2 kg)   SpO2 98%   BMI 41.89 kg/m   Physical Exam  Constitutional: She is oriented to person, place, and time. She appears well-developed and well-nourished. No distress.  HENT:  Head: Normocephalic and atraumatic.  Eyes: Pupils are equal, round, and reactive to light.  Neck: Trachea normal, normal range of motion and full passive range of motion without pain. Neck supple. No JVD present. No tracheal tenderness and no spinous process tenderness present. Carotid bruit is not present. No neck rigidity. No tracheal deviation and no edema present. No thyromegaly present.  Left-sided supraclavicular region with questionable cystic lesion, slightly greater in size than right.  3 x 3 cm approximately.  Cardiovascular: Normal rate, regular rhythm and normal heart sounds.  Pulmonary/Chest: Effort normal and breath sounds normal. No respiratory distress. She exhibits no tenderness, no crepitus, no edema, no deformity and no retraction. Right breast exhibits no inverted nipple, no mass, no nipple discharge, no skin change and no tenderness. Left breast exhibits tenderness. Left breast exhibits no inverted nipple, no mass, no nipple discharge and no skin change. Breasts are symmetrical.  Left breast upper inner quadrant tender to  palpation at 11 o'clock position. Slight density palpated.  No nipple discharge on  examination.  No skin lesions of breasts.  No axillary lymphadenopathy noted.  Neurological: She is alert and oriented to person, place, and time. No cranial nerve deficit.  Skin: Skin is warm and dry.  Nursing note and vitals reviewed.    Assessment and Plan  1. Nipple discharge in female At this time will check hormone levels and schedule patient for a diagnostic mammogram and ultrasound.  Pending results of this study will consider MRI of pituitary.  Did discuss with patient that this could be secondary to the fact that she has hormonal imbalance and is not been on her thyroid hormone for several months.  We will proceed with evaluation at this time. - Prolactin - MM Digital Diagnostic Bilat; Future - US BREAST COMPLETE UNI LEFT INC AXILLA; Future - US BREAST COMPLETE UNI RIGHT INC AXILLA; Future  2. Controlled type 2 diabetes mellitus without complication, without long-term current use of insulin (HCC) Recommend continue medication at this time.  Check laboratory evaluation to gain baseline information on diabetes.  Did discuss foot exam, eye exam and other recommended yearly maintenance evaluations for patients with diabetes.The patient is asked to make an attempt to improve diet and exercise patterns to aid in medical management of this problem.  - COMPLETE METABOLIC PANEL WITH GFR - Hemoglobin A1c - Microalbumin / creatinine urine ratio  3. Amenorrhea Amenorrhea possibly secondary to perimenopausal status.  Urine hCG negative.  Check hormones at this time. - POCT urine pregnancy - FSH/LH - Estradiol  4. Immunization due Vaccinations given in office today.  Reports he has never had any pneumonia shots which could be recommended secondary to her diabetes status. - Tdap vaccine greater than or equal to 7yo IM - Pneumococcal conjugate vaccine 13-valent IM  5. Screening examination for STD (sexually transmitted disease) Screening performed. - HIV antibody (with reflex)  6.  Fatigue, unspecified type Suspect that fatigue is multifactorial in etiology at this time.  Check labs. - CBC with Differential/Platelet  7. Hypothyroidism, unspecified type Check thyroid ultrasound and TSH at this time.  Will restart medication if indicated. - TSH  8. Supraclavicular fossa fullness Did discuss supraclavicular fossa fullness with patient in detail today.  I believe that this could be secondary to a fat pad.  However, will obtain ultrasound at this time. - US Soft Tissue Head/Neck; Future  9. Seasonal allergies Discussed with patient I do not believe that she is indicated for montelukast and should try Xyzal or over-the-counter antihistamine.  She has no history of cough or asthma.  She has no allergic induced evidence of bronchospasm at this time.  Prescription sent in for this medication. - levocetirizine (XYZAL) 5 MG tablet; Take 1 tablet (5 mg total) by mouth every evening.  Dispense: 90 tablet; Refill: 0  Records requested from previous doctor. Had multiple complaints today in the office.  The office visit was greater than 60 minutes.  Greater than 50% of office visit spent counseling and coordinating care.  She is to follow-up in 2 weeks to discuss her labs and continue evaluation of her multiple issues.  She was told in the meantime to call with any questions, concerns, or worrisome signs and symptoms. Return in about 2 weeks (around 03/13/2018). Aliene Beams, MD 02/28/2018

## 2018-02-28 LAB — CBC WITH DIFFERENTIAL/PLATELET
BASOS ABS: 48 {cells}/uL (ref 0–200)
Basophils Relative: 0.6 %
EOS ABS: 280 {cells}/uL (ref 15–500)
Eosinophils Relative: 3.5 %
HCT: 42 % (ref 35.0–45.0)
Hemoglobin: 14.1 g/dL (ref 11.7–15.5)
Lymphs Abs: 1944 cells/uL (ref 850–3900)
MCH: 28.4 pg (ref 27.0–33.0)
MCHC: 33.6 g/dL (ref 32.0–36.0)
MCV: 84.5 fL (ref 80.0–100.0)
MPV: 13 fL — ABNORMAL HIGH (ref 7.5–12.5)
Monocytes Relative: 7.4 %
NEUTROS PCT: 64.2 %
Neutro Abs: 5136 cells/uL (ref 1500–7800)
PLATELETS: 147 10*3/uL (ref 140–400)
RBC: 4.97 10*6/uL (ref 3.80–5.10)
RDW: 14.8 % (ref 11.0–15.0)
TOTAL LYMPHOCYTE: 24.3 %
WBC: 8 10*3/uL (ref 3.8–10.8)
WBCMIX: 592 {cells}/uL (ref 200–950)

## 2018-02-28 LAB — COMPLETE METABOLIC PANEL WITH GFR
AG Ratio: 1.2 (calc) (ref 1.0–2.5)
ALT: 60 U/L — AB (ref 6–29)
AST: 38 U/L — AB (ref 10–30)
Albumin: 4.2 g/dL (ref 3.6–5.1)
Alkaline phosphatase (APISO): 82 U/L (ref 33–115)
BUN: 24 mg/dL (ref 7–25)
CALCIUM: 9.8 mg/dL (ref 8.6–10.2)
CO2: 28 mmol/L (ref 20–32)
CREATININE: 0.97 mg/dL (ref 0.50–1.10)
Chloride: 103 mmol/L (ref 98–110)
GFR, EST AFRICAN AMERICAN: 82 mL/min/{1.73_m2} (ref >=60)
GFR, EST NON AFRICAN AMERICAN: 71 mL/min/{1.73_m2} (ref >=60)
Globulin: 3.4 g/dL (calc) (ref 1.9–3.7)
Glucose, Bld: 98 mg/dL (ref 65–99)
Potassium: 4.3 mmol/L (ref 3.5–5.3)
Sodium: 140 mmol/L (ref 135–146)
TOTAL PROTEIN: 7.6 g/dL (ref 6.1–8.1)
Total Bilirubin: 0.5 mg/dL (ref 0.2–1.2)

## 2018-02-28 LAB — HEMOGLOBIN A1C
Hgb A1c MFr Bld: 5.9 % of total Hgb — ABNORMAL HIGH (ref <5.7)
MEAN PLASMA GLUCOSE: 123 (calc)
eAG (mmol/L): 6.8 (calc)

## 2018-02-28 LAB — FSH/LH
FSH: 24.3 m[IU]/mL
LH: 23.9 m[IU]/mL

## 2018-02-28 LAB — TSH: TSH: 3.48 mIU/L

## 2018-02-28 LAB — ESTRADIOL: Estradiol: 28 pg/mL

## 2018-02-28 LAB — MICROALBUMIN / CREATININE URINE RATIO
CREATININE, URINE: 87 mg/dL (ref 20–275)
MICROALB UR: 0.8 mg/dL
MICROALB/CREAT RATIO: 9 ug/mg{creat} (ref <30)

## 2018-02-28 LAB — PROLACTIN: PROLACTIN: 4.4 ng/mL

## 2018-02-28 LAB — HIV ANTIBODY (ROUTINE TESTING W REFLEX): HIV: NONREACTIVE

## 2018-03-03 ENCOUNTER — Encounter: Payer: Self-pay | Admitting: Family Medicine

## 2018-03-06 ENCOUNTER — Ambulatory Visit (HOSPITAL_COMMUNITY)
Admission: RE | Admit: 2018-03-06 | Discharge: 2018-03-06 | Disposition: A | Discharge status: Home / Self Care | Payer: Medicare Other | Source: Ambulatory Visit | Attending: Family Medicine | Admitting: Family Medicine

## 2018-03-06 DIAGNOSIS — R590 Localized enlarged lymph nodes: Secondary | ICD-10-CM | POA: Diagnosis not present

## 2018-03-06 DIAGNOSIS — R222 Localized swelling, mass and lump, trunk: Secondary | ICD-10-CM

## 2018-03-08 ENCOUNTER — Telehealth: Payer: Self-pay | Admitting: Family Medicine

## 2018-03-08 NOTE — Telephone Encounter (Signed)
Please call patient and advise that I have reviewed her ultrasound results of the area in her neck that she was concerned about.  The ultrasound revealed the area of concern is most likely a small benign-appearing left supraclavicular lymph node. We will discuss this at her follow up. Janine Limboachel H. Tracie HarrierHagler, MD

## 2018-03-10 ENCOUNTER — Other Ambulatory Visit: Payer: Self-pay | Admitting: Family Medicine

## 2018-03-10 ENCOUNTER — Ambulatory Visit (HOSPITAL_COMMUNITY)
Admission: RE | Admit: 2018-03-10 | Discharge: 2018-03-10 | Disposition: A | Discharge status: Home / Self Care | Payer: Medicare Other | Source: Ambulatory Visit | Attending: Family Medicine | Admitting: Family Medicine

## 2018-03-10 ENCOUNTER — Encounter (HOSPITAL_COMMUNITY): Payer: Self-pay | Admitting: Radiology

## 2018-03-10 DIAGNOSIS — N6001 Solitary cyst of right breast: Secondary | ICD-10-CM | POA: Diagnosis not present

## 2018-03-10 DIAGNOSIS — N6452 Nipple discharge: Secondary | ICD-10-CM | POA: Diagnosis not present

## 2018-03-10 DIAGNOSIS — N6489 Other specified disorders of breast: Secondary | ICD-10-CM | POA: Diagnosis not present

## 2018-03-10 DIAGNOSIS — R928 Other abnormal and inconclusive findings on diagnostic imaging of breast: Secondary | ICD-10-CM | POA: Diagnosis not present

## 2018-03-10 NOTE — Telephone Encounter (Signed)
Patient informed of message below, verbalized understanding.  

## 2018-03-19 ENCOUNTER — Ambulatory Visit: Payer: Medicare Other | Admitting: Family Medicine

## 2018-03-24 ENCOUNTER — Ambulatory Visit (INDEPENDENT_AMBULATORY_CARE_PROVIDER_SITE_OTHER): Payer: Medicare Other | Admitting: Family Medicine

## 2018-03-24 ENCOUNTER — Encounter: Payer: Self-pay | Admitting: Family Medicine

## 2018-03-24 VITALS — BP 114/74 | HR 86 | Resp 16 | Ht 65.0 in | Wt 258.0 lb

## 2018-03-24 DIAGNOSIS — R7989 Other specified abnormal findings of blood chemistry: Secondary | ICD-10-CM

## 2018-03-24 DIAGNOSIS — R945 Abnormal results of liver function studies: Secondary | ICD-10-CM

## 2018-03-24 DIAGNOSIS — E1169 Type 2 diabetes mellitus with other specified complication: Secondary | ICD-10-CM

## 2018-03-24 DIAGNOSIS — N6452 Nipple discharge: Secondary | ICD-10-CM

## 2018-03-24 DIAGNOSIS — N946 Dysmenorrhea, unspecified: Secondary | ICD-10-CM

## 2018-03-24 MED ORDER — IBUPROFEN 800 MG PO TABS: 800.0000 mg | ORAL_TABLET | Freq: Three times a day (TID) | ORAL | 0 refills | Status: DC | PRN

## 2018-03-24 NOTE — Progress Notes (Signed)
Patient ID: Paula Butler, female    DOB: [birth year], 47 y.o.   MRN: [id number]  Chief Complaint  Patient presents with  . Diabetes    follow up visit and review labs   . Hyperlipidemia    Allergies Asa [aspirin] and Penicillins  Subjective:   Paula Butler is a 47 y.o. female who presents to [facility] today.  HPI Pete presents back today for follow-up visit.  She reports that she is gaining some weight since she was last seen here.  She is here to discuss her blood work which was performed at her last visit.  She reports that she was diagnosed with hypothyroidism many years ago but has been off of medications for over a year now.  Her TSH was within normal limits on her labs.  She does confirm again today that she has not been on any thyroid medications in over one year.  She reports that she did receive the mammogram for the nipple discharge.  Review of mammogram is done today at the office visit.  It is recommended for her to have a bilateral MRI and referral for surgical consultation.  Patient is agreeable with this.  She reports that she has had nipple discharge out of both breasts since her last visit.  She is not in any pain.  She does not feel any lumps or lesions in her breast.  She reports that she did start her menses.  She reports that she had not had a period since the beginning of the year.  She reports that she is having some cramping with this menstrual period and would like a refill on the prescription strength Motrin that she has used in the past.  She reports that this.  Is like her previous menses.  She is not sure why she missed a few periods but does correlate it to entering the postmenopausal state of life.  She denies any pelvic pain.  She is not having any postcoital bleeding.  She denies any abnormal vaginal discharge.  She does need to have a Pap smear performed but does not want to do this today since she is on her menses.  She is also here  to follow-up to discuss her blood sugars.  Her hemoglobin A1c was well controlled.  She is taking the metformin twice a day.  She denies any side effects.  She does have an upcoming appointment to get her diabetic eye exam.  She is not on a cholesterol medication at this time.  She reports she has never been told that she needed one in the past.  Her cholesterol was not checked at the last visit.  In review of her labs today, we discussed that she does have elevated liver test.  She denies any history of viral hepatitis.  No prior history of elevated liver tests.  She denies any Tylenol usage or herbal products.  She denies any right upper quadrant pain.  She does not drink alcohol.  She denies any drug use.  Patient reports that she is interested in losing weight.  She reports that prior to moving from [place] she was considering bariatric surgery.  She reports that she would like some help from a dietitian regarding her diet and ways to lose weight.   Past Medical History:  Diagnosis Date  . Diabetes mellitus without complication (HCC)   . Hyperlipidemia     Past Surgical History:  Procedure Laterality Date  . CHOLECYSTECTOMY    .  TUBAL LIGATION      Family History  Problem Relation Age of Onset  . Diabetes Mother   . Hypothyroidism Mother   . Prostate cancer Father   . Breast cancer Sister   . Stroke Brother      Social History   Socioeconomic History  . Marital status: Married    Spouse name: Not on file  . Number of children: Not on file  . Years of education: Not on file  . Highest education level: Not on file  Occupational History  . Not on file  Social Needs  . Financial resource strain: Not on file  . Food insecurity:    Worry: Not on file    Inability: Not on file  . Transportation needs:    Medical: Not on file    Non-medical: Not on file  Tobacco Use  . Smoking status: Never Smoker  . Smokeless tobacco: Never Used  Substance and Sexual Activity  .  Alcohol use: No  . Drug use: No  . Sexual activity: Yes    Partners: Male    Birth control/protection: Surgical  Lifestyle  . Physical activity:    Days per week: Not on file    Minutes per session: Not on file  . Stress: Not on file  Relationships  . Social connections:    Talks on phone: Not on file    Gets together: Not on file    Attends religious service: Not on file    Active member of club or organization: Not on file    Attends meetings of clubs or organizations: Not on file    Relationship status: Not on file  Other Topics Concern  . Not on file  Social History Narrative   Married for 10 years, 3 children, all grown.    2 daughters, 1 son.   Attends church.    Walk.   Sings.   Eats all food groups.   Wears seatbelt.   Born in [place], grew up in [place]. Father in [employer]. Grandmother raised her.    Moved from [place] in 2018.    Does not work out side of home.     Review of Systems  Constitutional: Negative for activity change, appetite change and fever.  HENT: Negative for nosebleeds and trouble swallowing.   Eyes: Negative for visual disturbance.  Respiratory: Negative for cough, chest tightness and shortness of breath.   Cardiovascular: Negative for chest pain, palpitations and leg swelling.  Gastrointestinal: Negative for abdominal pain, nausea and vomiting.  Genitourinary: Positive for menstrual problem. Negative for dysuria, frequency and urgency.       Pain with menstrual cramps. Currently on menses. Used advil prn for pain with cramps at prescription dose in the past. Reports that it worked well. Would like a refill.   Neurological: Negative for dizziness, syncope and light-headedness.  Hematological: Negative for adenopathy.  Psychiatric/Behavioral: Negative for agitation, behavioral problems and dysphoric mood. The patient is not nervous/anxious.      Objective:   BP 114/74   Pulse 86   Resp 16   Ht 5\' 5"  (1.651 m)   Wt 258 lb (117 kg)   SpO2  98%   BMI 42.93 kg/m   Physical Exam  Constitutional: She is oriented to person, place, and time. She appears well-developed and well-nourished. No distress.  HENT:  Head: Normocephalic and atraumatic.  Eyes: Pupils are equal, round, and reactive to light.  Neck: Normal range of motion. Neck supple. No thyromegaly present.  Cardiovascular: Normal rate, regular rhythm and normal heart sounds.  Pulmonary/Chest: Effort normal and breath sounds normal. No respiratory distress.  Neurological: She is alert and oriented to person, place, and time. No cranial nerve deficit.  Skin: Skin is warm and dry.  Psychiatric: She has a normal mood and affect. Her behavior is normal. Judgment and thought content normal.  Nursing note and vitals reviewed.  Depression screen PHQ 2/9 02/27/2018  Decreased Interest 0  Down, Depressed, Hopeless 0  PHQ - 2 Score 0     Assessment and Plan  1. Elevated liver function tests Discussed elevated liver tests with patient at this time.  We will go ahead and obtain viral hepatitis panel and abdominal ultrasound specific for the liver.  Discussed with patient that I suspect this is secondary to her obesity and hepatic steatosis.  We did discuss this today and that more than likely weight loss is going to be the best intervention.  We will wait until we get the ultrasound back and will discuss at her next visit. - Hepatic function panel - Hepatitis panel, acute - US Abdomen Limited RUQ; Future  2. Morbid obesity (HCC) Diet, exercise, and weight loss strategies discussed with patient today.  She would like referral to nutrition and diabetic services secondary to her diabetes and her morbid obesity.  Referral placed today. - Referral to Nutrition and Diabetes Services  3. Type 2 diabetes mellitus with other specified complication, without long-term current use of insulin (HCC) Continue metformin as directed.  A1c at goal.  Diabetic eye exam ordered.  We will plan on  foot exam at next visit. - Lipid panel Patient does have an indication for statin use secondary to the fact that she is a diabetic.  However, due to her elevated liver tests will get the lipid panel and recheck her LFTs at this time prior to initiating therapy. 4. Nipple discharge Mammography reviewed today including its recommendation.  Will obtain bilateral breast MRI with and without contrast and will refer to general surgery for evaluation.  Patient is agreeable to testing. - MR BREAST BILATERAL W WO CONTRAST INC CAD; Future - Ambulatory referral to General Surgery  5. Dysmenorrhea Discussed risks of cardiovascular thrombotic events related to NSAIDS. Discussed increased risk of AMI and CVA. Discussed risk of serious GI adverse events including bleeding, ulcers, and perforation. Patient understands risks of this medication.   - ibuprofen (ADVIL,MOTRIN) 800 MG tablet; Take 1 tablet (800 mg total) by mouth every 8 (eight) hours as needed.  Dispense: 60 tablet; Refill: 0  6. History of abnormal thyroid tests No indication at this time for continued use of thyroid replacement therapy.  We will plan to recheck thyroid functions in 6 months.  Patient will return in approximately 5-6 weeks for CPE including Pap, pelvic, and will do diabetic foot exam at that time. Return in about 5 weeks (around 04/28/2018) for CPE. Aliene Beamsachel Manna Gose, MD 03/24/2018

## 2018-03-24 NOTE — Patient Instructions (Signed)
Obesity, Adult Obesity is having too much body fat. If you have a BMI of 30 or more, you are obese. BMI is a number that explains how much body fat you have. Obesity is often caused by taking in (consuming) more calories than your body uses. Obesity can cause serious health problems. Changing your lifestyle can help to treat obesity. Follow these instructions at home: Eating and drinking   Follow advice from your doctor about what to eat and drink. Your doctor may tell you to: ? Cut down on (limit) fast foods, sweets, and processed snack foods. ? Choose low-fat options. For example, choose low-fat milk instead of whole milk. ? Eat 5 or more servings of fruits or vegetables every day. ? Eat at home more often. This gives you more control over what you eat. ? Choose healthy foods when you eat out. ? Learn what a healthy portion size is. A portion size is the amount of a certain food that is healthy for you to eat at one time. This is different for each person. ? Keep low-fat snacks available. ? Avoid sugary drinks. These include soda, fruit juice, iced tea that is sweetened with sugar, and flavored milk. ? Eat a healthy breakfast.  Drink enough water to keep your pee (urine) clear or pale yellow.  Do not go without eating for long periods of time (do not fast).  Do not go on popular or trendy diets (fad diets). Physical Activity  Exercise often, as told by your doctor. Ask your doctor: ? What types of exercise are safe for you. ? How often you should exercise.  Warm up and stretch before being active.  Do slow stretching after being active (cool down).  Rest between times of being active. Lifestyle  Limit how much time you spend in front of your TV, computer, or video game system (be less sedentary).  Find ways to reward yourself that do not involve food.  Limit alcohol intake to no more than 1 drink a day for nonpregnant women and 2 drinks a day for men. One drink equals 12 oz  of beer, 5 oz of wine, or 1 oz of hard liquor. General instructions  Keep a weight loss journal. This can help you keep track of: ? The food that you eat. ? The exercise that you do.  Take over-the-counter and prescription medicines only as told by your doctor.  Take vitamins and supplements only as told by your doctor.  Think about joining a support group. Your doctor may be able to help with this.  Keep all follow-up visits as told by your doctor. This is important. Contact a doctor if:  You cannot meet your weight loss goal after you have changed your diet and lifestyle for 6 weeks. This information is not intended to replace advice given to you by your health care provider. Make sure you discuss any questions you have with your health care provider. Document Released: 02/24/2012 Document Revised: 05/09/2016 Document Reviewed: 09/20/2015 Elsevier Interactive Patient Education  2018 Elsevier Inc.  

## 2018-03-25 ENCOUNTER — Telehealth: Payer: Self-pay | Admitting: Family Medicine

## 2018-03-25 DIAGNOSIS — E1169 Type 2 diabetes mellitus with other specified complication: Secondary | ICD-10-CM

## 2018-03-25 DIAGNOSIS — E785 Hyperlipidemia, unspecified: Principal | ICD-10-CM

## 2018-03-25 LAB — HEPATIC FUNCTION PANEL
AG Ratio: 1.2 (calc) (ref 1.0–2.5)
ALBUMIN MSPROF: 3.7 g/dL (ref 3.6–5.1)
ALT: 13 U/L (ref 6–29)
AST: 17 U/L (ref 10–30)
Alkaline phosphatase (APISO): 74 U/L (ref 33–115)
BILIRUBIN TOTAL: 0.5 mg/dL (ref 0.2–1.2)
Bilirubin, Direct: 0.1 mg/dL (ref 0.0–0.2)
GLOBULIN: 3.1 g/dL (ref 1.9–3.7)
Indirect Bilirubin: 0.4 mg/dL (calc) (ref 0.2–1.2)
Total Protein: 6.8 g/dL (ref 6.1–8.1)

## 2018-03-25 LAB — LIPID PANEL
Cholesterol: 190 mg/dL (ref <200)
HDL: 34 mg/dL — ABNORMAL LOW (ref >50)
LDL CHOLESTEROL (CALC): 126 mg/dL — AB
Non-HDL Cholesterol (Calc): 156 mg/dL (calc) — ABNORMAL HIGH (ref <130)
TRIGLYCERIDES: 183 mg/dL — AB (ref <150)
Total CHOL/HDL Ratio: 5.6 (calc) — ABNORMAL HIGH (ref <5.0)

## 2018-03-25 LAB — HEPATITIS PANEL, ACUTE
HEP B C IGM: NONREACTIVE
HEP B S AG: NONREACTIVE
Hep A IgM: NONREACTIVE
Hepatitis C Ab: NONREACTIVE
SIGNAL TO CUT-OFF: 0.03 (ref <1.00)

## 2018-03-25 MED ORDER — ATORVASTATIN CALCIUM 20 MG PO TABS: 20.0000 mg | ORAL_TABLET | Freq: Every day | ORAL | 3 refills | Status: DC

## 2018-03-25 NOTE — Telephone Encounter (Signed)
Please call patient and advised that her cholesterol is elevated.  Her LDL is greater than 130.  Her goal is less than 70.  Her liver function was within normal limits.  I would like for her to start Lipitor 20 mg each night.  This is the medication that we discussed that her office visit.  Please advise patient that we will go ahead and proceed with the ultrasound of her liver.  Advised her that her hepatitis testing for hepatitis A, B, MCV was negative.  Please advise her to keep her scheduled follow-up and we will plan on rechecking her cholesterol and liver in 2-3 months.  I [facility].

## 2018-03-26 ENCOUNTER — Telehealth: Payer: Self-pay | Admitting: Family Medicine

## 2018-03-26 NOTE — Telephone Encounter (Signed)
Per Dr.Jenkins office, patient needs CT before she can be scheduled with Dr.Bridges

## 2018-03-26 NOTE — Telephone Encounter (Signed)
Please call and confirm with office that they want a CT scan. We are getting the MRI of breast as requested by radiology. They also requested referral to breast surgeon. Please advise office that I do not understand why they need a CT of breast. Please ask if they will not schedule patient with Dr. Henreitta LeberBridges to have Dr. Henreitta LeberBridges please call me. Please make sure patient has MRI of breasts scheduled.

## 2018-03-26 NOTE — Telephone Encounter (Signed)
Patient informed of message below, verbalized understanding.   She states she had a message from Dusty needing to talk to her about a specialist. Please call her back

## 2018-03-27 ENCOUNTER — Telehealth: Payer: Self-pay | Admitting: Family Medicine

## 2018-03-27 NOTE — Telephone Encounter (Signed)
Called patient to inform of procedure appointments. They are as follows: Ultrasound Friday 4/19 at 10 am MRI Friday 4/19 at 11 am  She needs to arrive at [facility] by 9:45 am. She needs to be fasting (nothing to eat or drink after midnight) for these appointments. They can be rescheduled by calling Central Scheduling at [phone].  I was unable to reach patient, I left a message to return call for appointments.

## 2018-03-30 ENCOUNTER — Other Ambulatory Visit: Payer: Self-pay | Admitting: Family Medicine

## 2018-03-30 DIAGNOSIS — N6452 Nipple discharge: Secondary | ICD-10-CM

## 2018-03-30 NOTE — Telephone Encounter (Signed)
I also need to inform patient of appt with Dr. Henreitta LeberBridges 4/23 at 10am

## 2018-03-30 NOTE — Telephone Encounter (Signed)
Forwarded to breanna.

## 2018-03-30 NOTE — Telephone Encounter (Signed)
Patient called in to request times and days  of US & CT, I also told her about her appt with Dr.Bridges.

## 2018-03-30 NOTE — Telephone Encounter (Signed)
Called patient regarding message below. No answer, unable to leave message.  

## 2018-03-31 ENCOUNTER — Telehealth: Payer: Self-pay | Admitting: Family Medicine

## 2018-03-31 MED ORDER — LORAZEPAM 0.5 MG PO TABS: ORAL_TABLET | ORAL | 0 refills | Status: DC

## 2018-03-31 NOTE — Telephone Encounter (Signed)
Patient was in office today with daughter. Requesting medicine to help with anxiety for upcoming closed MRI breast on Friday.  D/w patient. Medication sent to pharmacy. Sedation precautions given. She is not driving the MRI.    She has also not heard regarding the nutritionist. Please assist and advise.

## 2018-04-03 ENCOUNTER — Ambulatory Visit (HOSPITAL_COMMUNITY)
Admission: RE | Admit: 2018-04-03 | Discharge: 2018-04-03 | Disposition: A | Discharge status: Home / Self Care | Payer: Medicare Other | Source: Ambulatory Visit | Attending: Family Medicine | Admitting: Family Medicine

## 2018-04-03 ENCOUNTER — Other Ambulatory Visit (HOSPITAL_COMMUNITY): Payer: Medicare Other

## 2018-04-03 DIAGNOSIS — K760 Fatty (change of) liver, not elsewhere classified: Secondary | ICD-10-CM | POA: Insufficient documentation

## 2018-04-03 DIAGNOSIS — R945 Abnormal results of liver function studies: Secondary | ICD-10-CM | POA: Insufficient documentation

## 2018-04-03 DIAGNOSIS — Z9049 Acquired absence of other specified parts of digestive tract: Secondary | ICD-10-CM | POA: Diagnosis not present

## 2018-04-03 DIAGNOSIS — R7989 Other specified abnormal findings of blood chemistry: Secondary | ICD-10-CM

## 2018-04-07 ENCOUNTER — Encounter: Payer: Self-pay | Admitting: Family Medicine

## 2018-04-07 ENCOUNTER — Ambulatory Visit: Payer: Medicare Other | Admitting: General Surgery

## 2018-04-07 DIAGNOSIS — K760 Fatty (change of) liver, not elsewhere classified: Secondary | ICD-10-CM | POA: Insufficient documentation

## 2018-04-07 NOTE — Progress Notes (Signed)
Letter for patient sent

## 2018-04-08 NOTE — Telephone Encounter (Signed)
She has been called 1x, per referral status.

## 2018-04-09 ENCOUNTER — Ambulatory Visit (HOSPITAL_COMMUNITY): Admission: RE | Admit: 2018-04-09 | Payer: Medicare Other | Source: Ambulatory Visit

## 2018-04-09 ENCOUNTER — Telehealth: Payer: Self-pay

## 2018-04-09 DIAGNOSIS — N6452 Nipple discharge: Secondary | ICD-10-CM

## 2018-04-09 NOTE — Telephone Encounter (Signed)
Please call and give her the information so that she can call and schedule herself.

## 2018-04-09 NOTE — Telephone Encounter (Signed)
I have tried to find open MRI and I have not been able to. Please see if wider bore MRI at [facility] is okay with her.

## 2018-04-09 NOTE — Telephone Encounter (Signed)
Patient called stating she was unable to have the MRI because it was a closed MRI and she needs an open MRI because she is too claustrophobic. Please call patient to discuss. [phone]

## 2018-04-13 NOTE — Telephone Encounter (Signed)
Referral for MRI corrected per [facility]- they will contact patient.

## 2018-04-13 NOTE — Telephone Encounter (Signed)
Yes that is ok with her

## 2018-04-13 NOTE — Addendum Note (Signed)
Addended by: Mack Hook on: 04/13/2018 03:28 PM   Modules accepted: Orders

## 2018-04-16 ENCOUNTER — Ambulatory Visit (HOSPITAL_COMMUNITY): Payer: Medicare Other

## 2018-04-19 ENCOUNTER — Ambulatory Visit
Admission: RE | Admit: 2018-04-19 | Discharge: 2018-04-19 | Disposition: A | Discharge status: Home / Self Care | Payer: Medicare Other | Source: Ambulatory Visit | Attending: Family Medicine | Admitting: Family Medicine

## 2018-04-19 DIAGNOSIS — N6452 Nipple discharge: Secondary | ICD-10-CM

## 2018-04-20 ENCOUNTER — Telehealth: Payer: Self-pay | Admitting: Family Medicine

## 2018-04-20 NOTE — Telephone Encounter (Signed)
Pt is out of the Metformin, can you call her some in , she states that you have never prescribed this to her. [facility] in [place]. Please call if there is any problems with this request.

## 2018-04-21 ENCOUNTER — Telehealth: Payer: Self-pay | Admitting: Family Medicine

## 2018-04-21 DIAGNOSIS — E1169 Type 2 diabetes mellitus with other specified complication: Secondary | ICD-10-CM

## 2018-04-21 MED ORDER — METFORMIN HCL 500 MG PO TABS: 500.0000 mg | ORAL_TABLET | Freq: Two times a day (BID) | ORAL | 1 refills | Status: DC

## 2018-04-21 NOTE — Telephone Encounter (Signed)
Sent to pharmacy by me. Please advise patient. Paula Butler. Tracie Harrier, MD

## 2018-04-21 NOTE — Telephone Encounter (Signed)
Please advise that metformin was sent to pharmacy. Janine Limbo. Tracie Harrier, MD

## 2018-04-22 ENCOUNTER — Telehealth: Payer: Self-pay | Admitting: Family Medicine

## 2018-04-22 ENCOUNTER — Ambulatory Visit
Admission: RE | Admit: 2018-04-22 | Discharge: 2018-04-22 | Disposition: A | Discharge status: Home / Self Care | Payer: Medicare Other | Source: Ambulatory Visit | Attending: Family Medicine | Admitting: Family Medicine

## 2018-04-22 DIAGNOSIS — Z803 Family history of malignant neoplasm of breast: Secondary | ICD-10-CM | POA: Diagnosis not present

## 2018-04-22 DIAGNOSIS — N6452 Nipple discharge: Secondary | ICD-10-CM | POA: Diagnosis not present

## 2018-04-22 MED ORDER — GADOBENATE DIMEGLUMINE 529 MG/ML IV SOLN
20.0000 mL | Freq: Once | INTRAVENOUS | Status: AC | PRN
  Administered 2018-04-22: 20 mL via INTRAVENOUS

## 2018-04-22 NOTE — Telephone Encounter (Signed)
Left message requesting  call back.

## 2018-04-22 NOTE — Telephone Encounter (Signed)
Pt is returning your call please call °

## 2018-04-24 NOTE — Telephone Encounter (Signed)
Called patient. No answer. No vm

## 2018-04-28 ENCOUNTER — Ambulatory Visit (INDEPENDENT_AMBULATORY_CARE_PROVIDER_SITE_OTHER): Payer: Medicare Other | Admitting: General Surgery

## 2018-04-28 ENCOUNTER — Encounter: Payer: Self-pay | Admitting: General Surgery

## 2018-04-28 VITALS — BP 143/88 | HR 71 | Temp 97.5°F | Resp 16 | Wt 257.0 lb

## 2018-04-28 DIAGNOSIS — N6452 Nipple discharge: Secondary | ICD-10-CM

## 2018-04-28 NOTE — Progress Notes (Signed)
[facility] History and Physical  Reason for Referral: Nipple discharge (left breast only)  Referring Physician:  Dr. Tracie Harrier   Chief Complaint    Breast Discharge      Paula Butler is a 47 y.o. female.  HPI: Paula Butler is a 47 yo with reported spontaneous left nipple discharge that occurs about 1 a week to less often.  She notices the discharge on her bra, and this started a few months back. She has not noticed any lumps or bumps or inversion of her nipple, and has had no drainage from the right breast. The discharge is brownish in color/ possibly bloody?  She underwent US/ Mammogram that was BIRADs 1 and MRI that was negative for any papilloma, mass or other concerning feature. She continues to have this drainage, and was referred to me for further evaluation and planning.   She sees Dr. Tracie Harrier for her PCP, and metformin for diabetes and did take medications previously for hypothyroidism but none currently with TSH of 3.48 after being off the medications for 1 year.  She had menarche at age 39, and her first pregnancy at age 68. She is G6P3 (miscarriages). She did breastfeed her children.  She has a positive history of any family breast cancer with her sister dying at age 98 from breast cancer.  She thinks she is currently undergoing menopause, having her last period a month ago and then in December prior to that one.  She has never had any previous biopsies or concerning areas on mammogram.  She has not had any chest radiation.   Past Medical History:  Diagnosis Date  . Diabetes mellitus without complication (HCC)   . Hyperlipidemia     Past Surgical History:  Procedure Laterality Date  . CHOLECYSTECTOMY    . TUBAL LIGATION      Family History  Problem Relation Age of Onset  . Diabetes Mother   . Hypothyroidism Mother   . Prostate cancer Father   . Breast cancer Sister   . Stroke Brother     Social History   Tobacco Use  . Smoking status: Never  Smoker  . Smokeless tobacco: Never Used  Substance Use Topics  . Alcohol use: No  . Drug use: No    Medications: I have reviewed the patient's current medications. Allergies as of 04/28/2018      Reactions   Asa [aspirin] Other (See Comments)   Abdominal swelling    Penicillins Hives, Itching, Rash   Has patient had a PCN reaction causing immediate rash, facial/tongue/throat swelling, SOB or lightheadedness with hypotension: Yes Has patient had a PCN reaction causing severe rash involving mucus membranes or skin necrosis: No Has patient had a PCN reaction that required hospitalization: Yes Has patient had a PCN reaction occurring within the last 10 years: Yes If all of the above answers are "NO", then may proceed with Cephalosporin use.      Medication List        Accurate as of 04/28/18  9:30 AM. Always use your most recent med list.          atorvastatin 20 MG tablet Commonly known as:  LIPITOR Take 1 tablet (20 mg total) by mouth daily.   ibuprofen 800 MG tablet Commonly known as:  ADVIL,MOTRIN Take 1 tablet (800 mg total) by mouth every 8 (eight) hours as needed.   levocetirizine 5 MG tablet Commonly known as:  XYZAL Take 1 tablet (5 mg total) by mouth every evening.  LORazepam 0.5 MG tablet Commonly known as:  ATIVAN Take one tablet by mouth 30 minutes prior to test. May repeat again, prior to the test if needed for anxiety.   metFORMIN 500 MG tablet Commonly known as:  GLUCOPHAGE Take 1 tablet (500 mg total) by mouth 2 (two) times daily with a meal.        ROS:  A comprehensive review of systems was negative except for: Musculoskeletal: positive for back pain and stiff joints dry skin  Blood pressure (!) 143/88, pulse 71, temperature (!) 97.5 F (36.4 C), temperature source Temporal, resp. rate 16, weight 257 lb (116.6 kg). Physical Exam  Constitutional: She is oriented to person, place, and time. She appears well-developed and well-nourished.  HENT:    Head: Normocephalic and atraumatic.  Eyes: Pupils are equal, round, and reactive to light. EOM are normal.  Neck: Normal range of motion. Neck supple.  Cardiovascular: Normal rate and regular rhythm.  Pulmonary/Chest: Effort normal and breath sounds normal. Right breast exhibits no inverted nipple, no mass, no nipple discharge, no skin change and no tenderness. Left breast exhibits no inverted nipple, no mass, no nipple discharge, no skin change and no tenderness.  Unable to express any nipple discharge from right or left breast  Abdominal: Soft. She exhibits no distension. There is no tenderness.  Musculoskeletal: Normal range of motion. She exhibits no edema.  Lymphadenopathy:    She has no cervical adenopathy.    She has no axillary adenopathy.  Neurological: She is alert and oriented to person, place, and time.  Skin: Skin is warm and dry.  Psychiatric: She has a normal mood and affect. Her behavior is normal. Judgment and thought content normal.  Vitals reviewed.   Results: Mammogram /US 02/2018  IMPRESSION: No evidence of malignancy in either breast on imaging today. Given the patient's history of spontaneous brownish reddish left nipple discharge, further evaluation is suggested, as findings are suspicious for an occult papilloma or ductal carcinoma in situ.  RECOMMENDATION: Surgical consultation for presumed bloody left nipple discharge is recommended. Bilateral breast MRI with and without contrast should be considered for further evaluation of the nipple discharge, as well as for screening of both breasts given the patient's family history of breast cancer.  I have discussed the findings and recommendations with the patient. Results were also provided in writing at the conclusion of the visit. If applicable, a reminder letter will be sent to the patient regarding the next appointment.  BI-RADS CATEGORY  2: Benign.  MRI breast 04/2018  IMPRESSION: No MRI evidence  of malignancy is identified in either breast. No explanation for the patient's reported left nipple discharge is identified.  RECOMMENDATION: Surgical consultation could be considered given the patient's history of unilateral spontaneous nipple discharge.  Bilateral screening mammogram is recommended in March 2020.  BI-RADS CATEGORY  1: Negative.  Risk Calculator for Breast Cancer 5 yr risk is same as average population at 0.6%; lifetime risk slightly higher at 8.4% compared to 7.6%  Assessment & Plan:  Paula Butler is a 47 y.o. female with spontaneous, unilateral discharge that is brown to bloody in color. She has never had any discharge from the right side. She is unable to tell me if this is coming from a specific duct and I am unable to express any discharge.  She has underwent negative US/ Mammogram and MRI of the breast.  There is a risk of papilloma versus ductal ectasia causing this nipple discharge and low risk of DCIS  or malignancy.  Historically with nipple discharge that could not be located to a single duct a complete retroaerolar excision was formed of the nipple complex.   Recent research has demonstrated that patients with negative US/ mammogram, MRI can be followed for a 2 year period to see if the discharge spontaneously resolves, which occurs 81% of the time. Paula Butler and Paula Butler. Breast Imaging in patients with nipple discharge. Radiolo [place]. 2017 Nov-Dec; 50(6): 383-388;  Ashfaq et al. Bernell List study of a modern treatment algorithm for nipple discharge. Am J Surg. 2014; 208: 222-227).  Women can be offered clinical exam and repeat US every 6 months for 2 years with annual mammograms, and if the discharge spontaneously stops they avoid any surgery.    I have discussed this case and research with Paula Butler, and Paula Butler.  After talking with Paula Butler she has decided to proceed with retro-areola excision of the left breast based on her anxiety and desire to  have answers about the possibility of cancer.   All questions were answered to the satisfaction of the patient.  The risk and benefits of retroareola excision of the left breast were discussed including but not limited to bleeding, infection, risk of cancer, risk of needing more surgery, risk of needing radiation, risk of nipple loss/ necrosis.  After careful consideration, Paula Butler has decided to Proceed.    Paula Butler 04/28/2018, 9:30 AM

## 2018-04-28 NOTE — Patient Instructions (Signed)
What is nipple discharge? Nipple discharge is the release of fluid from the nipple. It is a very common breast symptom and in most cases is part of the normal function of the breast rather than being caused by a problem. Nipple discharge alone (without a lump or other nipple change) is a very uncommon symptom of breast cancer. There are normally 15-20 milk ducts opening onto each nipple. Discharge can come from one or a number of these ducts. What are the features of nipple discharge? Nipple discharge may be: Spontaneous (fluid leaks from the breast without any squeezing of the nipple or pressure on the breast); or  On expression (fluid only comes out of the nipple when the nipple is squeezed or there is pressure on the breast). Other questions which can be used to describe nipple discharge include: Is it coming from one breast (unilateral) or coming from both breasts (bilateral)?  Is it coming from one duct (one opening on the nipple) or more than one?  What colour is it? Nipple fluid is most often yellow, green or milky. This is not usually a cause for concern. Discharge that is blood-stained (bright red), brown or crystal clear can be more significant. If it is difficult to tell what colour it is, then putting some fluid onto a white tissue can help. Normal hormonal nipple discharge Nipple discharge is very common. Fluid can be obtained from the nipples of approximately 50-70% of normal women when special techniques, massage, or devices such as breast pumps are used. This discharge of fluid from a normal breast is referred to as 'physiological discharge'. This discharge is usually yellow, milky, or green in appearance, it does not happen spontaneously, and it can often be seen to be coming from more than one duct. Physiological nipple discharge is no cause for concern. Milky nipple discharge (either spontaneous or on expression) is also normal (physiological) during pregnancy and breast  feeding. When is nipple discharge abnormal? Spontaneous nipple discharge unrelated to pregnancy or breast feeding is considered abnormal. In most cases it has a non-cancerous (benign) cause. Spontaneous nipple discharge that is caused by disease (pathology) in the breast is more likely to be from one breast only (unilateral), confined to a single duct, and clear or blood-stained in appearance. Nipple discharge that is associated with other symptoms such as a lump in the breast or ulceration or inversion of the nipple needs prompt investigation, even if it is not spontaneous or blood-stained. What causes abnormal nipple discharge? There are many causes of nipple discharge. These include: Duct ectasia This is a non-cancerous (benign) condition in which the milk ducts under the nipple enlarge and there is inflammation in the walls of the ducts. It usually occurs in women after menopause. The discharge caused by duct ectasia usually comes from both breasts (bilateral), is yellow, green or brown, and comes from more than one duct. In most cases, no treatment is needed. If the discharge is a nuisance, the ducts behind the nipple can be removed surgically. Duct papilloma A duct papilloma is a growth within a milk duct in the breast, usually near the nipple. It may cause no symptoms, or it may cause a nipple discharge that is clear or blood-stained. It usually comes from a single duct and is from one breast only (unilateral). Rarely, duct papillomas can be associated with breast cancer and they can be difficult to diagnose confidently on a needle biopsy so they are usually removed surgically. Nipple eczema Eczema or dermatitis which affects  the skin of the nipple, particularly if it becomes infected, can cause a weeping, crusty nipple discharge. The treatment is the same as for eczema elsewhere on the body; with cortisone-based creams the main first-line treatment. Breast cancer Breast cancer is an uncommon  cause of nipple discharge. Less than 5% of women with breast cancer have nipple discharge, and most of these women have other symptoms, such as a lump or newly inverted nipple, as well as the nipple discharge. Paget's disease of the nipple Paget's disease is a particular type of breast cancer which involves the nipple. Paget's disease typically causes ulceration and erosion of the nipple skin, and it may be associated with a blood-stained nipple discharge. Hormonal causes Galactorrhoea is milky nipple discharge not related to pregnancy or breast feeding. It is caused by the abnormal production of a hormone called prolactin. This can be caused by diseases of glands elsewhere in the body which control hormone secretion, such as the pituitary and thyroid glands. Drugs and medication Abnormally high prolactin levels can also be caused by some drugs. These include oral contraceptives, hormone replacement therapy, and medications used for the treatment of nausea, depression and psychiatric disorders. Drugs such as cocaine and stimulants can also cause high prolactin levels. It is also common after breast feeding to have a prolonged milky nipple discharge. How is nipple discharge treated? Nipple discharge diagnosed as 'physiological discharge' requires no treatment. It is important to stop expressing, or squeezing the nipple and breast, as this causes more fluid to be made. As in breast feeding, the breast will produce fluid to replace the fluid that is removed, and this will continue as long as you are expressing. The discharge will usually stop when you stop expressing. Nipple discharge that is spontaneous, blood-stained, persistent, and unrelated to pregnancy or breast feeding needs to be investigated further. This investigation will include clinical examination by a doctor, and imaging of the breast with a mammogram and/or breast ultrasound. There is also a specialised X-ray available called a 'ductogram'. A  small amount of dye is injected into the discharging duct on the nipple. This outlines the duct and helps to identify abnormal growths in the duct lining. Sometimes the doctor may also send a sample of the discharging fluid for examination of the cells under a microscope to check for cancer cells. If any abnormality is found on these tests, a biopsy may be recommended. This may consist of a simple test such as a fine needle or core biopsy. Sometimes the area needs to be removed by a surgeon even if the tests are normal because changes in the nipple ducts can be difficult to see on a mammogram and ultrasound. Will I need surgery? Surgery for nipple discharge is sometimes warranted. This is usually reserved for cases where a significant abnormality, such as a papilloma or breast cancer is suspected. Surgery is usually also needed for bloodstained nipple discharge even if the tests show no abnormality. In this case, it is done to explore the ducts under the nipple to rule out significant abnormalities not seen on tests. Surgery may also be performed as a procedure to cure annoying discharge caused by conditions such as duct ectasia. Will I need surgery? Surgery for nipple discharge is sometimes warranted. This is usually reserved for cases where a significant abnormality, such as a papilloma or breast cancer is suspected. Surgery is usually also needed for bloodstained nipple discharge even if the tests show no abnormality. In this case, it is done to  explore the ducts under the nipple to rule out significant abnormalities not seen on tests. Surgery may also be performed as a procedure to cure annoying discharge caused by conditions such as duct ectasia.

## 2018-04-28 NOTE — Telephone Encounter (Signed)
Called patient no answer no vm. Have made several attempts to reach patient.

## 2018-04-29 ENCOUNTER — Telehealth: Payer: Self-pay | Admitting: General Surgery

## 2018-04-29 NOTE — Telephone Encounter (Addendum)
[facility]  Called and explained options of surveillance versus retroareola excision. She opts for excision. Plan for June 14.   Algis Greenhouse, MD [facility] [address] R[address] [place], [place] [address] [phone] (office)

## 2018-05-01 DIAGNOSIS — N6452 Nipple discharge: Secondary | ICD-10-CM

## 2018-05-01 NOTE — H&P (Signed)
[facility] History and Physical  Reason for Referral: Nipple discharge (left breast only)  Referring Physician:  Dr. Tracie Harrier      Chief Complaint    Breast Discharge      Paula Butler is a 47 y.o. female.  HPI: Paula Butler is a 47 yo with reported spontaneous left nipple discharge that occurs about 1 a week to less often.  She notices the discharge on her bra, and this started a few months back. She has not noticed any lumps or bumps or inversion of her nipple, and has had no drainage from the right breast. The discharge is brownish in color/ possibly bloody?  She underwent US/ Mammogram that was BIRADs 1 and MRI that was negative for any papilloma, mass or other concerning feature. She continues to have this drainage, and was referred to me for further evaluation and planning.   She sees Dr. Tracie Harrier for her PCP, and metformin for diabetes and did take medications previously for hypothyroidism but none currently with TSH of 3.48 after being off the medications for 1 year.  She had menarche at age 60, and her first pregnancy at age 51. She is G6P3 (miscarriages). She did breastfeed her children.  She has a positive history of any family breast cancer with her sister dying at age 27 from breast cancer.  She thinks she is currently undergoing menopause, having her last period a month ago and then in December prior to that one.  She has never had any previous biopsies or concerning areas on mammogram.  She has not had any chest radiation.       Past Medical History:  Diagnosis Date  . Diabetes mellitus without complication (HCC)   . Hyperlipidemia          Past Surgical History:  Procedure Laterality Date  . CHOLECYSTECTOMY    . TUBAL LIGATION           Family History  Problem Relation Age of Onset  . Diabetes Mother   . Hypothyroidism Mother   . Prostate cancer Father   . Breast cancer Sister   . Stroke Brother     Social  History       Tobacco Use  . Smoking status: Never Smoker  . Smokeless tobacco: Never Used  Substance Use Topics  . Alcohol use: No  . Drug use: No    Medications: I have reviewed the patient's current medications.      Allergies as of 04/28/2018      Reactions   Asa [aspirin] Other (See Comments)   Abdominal swelling    Penicillins Hives, Itching, Rash   Has patient had a PCN reaction causing immediate rash, facial/tongue/throat swelling, SOB or lightheadedness with hypotension: Yes Has patient had a PCN reaction causing severe rash involving mucus membranes or skin necrosis: No Has patient had a PCN reaction that required hospitalization: Yes Has patient had a PCN reaction occurring within the last 10 years: Yes If all of the above answers are "NO", then may proceed with Cephalosporin use.               Medication List            Accurate as of 04/28/18  9:30 AM. Always use your most recent med list.           atorvastatin 20 MG tablet Commonly known as:  LIPITOR Take 1 tablet (20 mg total) by mouth daily.   ibuprofen 800 MG tablet Commonly known  as:  ADVIL,MOTRIN Take 1 tablet (800 mg total) by mouth every 8 (eight) hours as needed.   levocetirizine 5 MG tablet Commonly known as:  XYZAL Take 1 tablet (5 mg total) by mouth every evening.   LORazepam 0.5 MG tablet Commonly known as:  ATIVAN Take one tablet by mouth 30 minutes prior to test. May repeat again, prior to the test if needed for anxiety.   metFORMIN 500 MG tablet Commonly known as:  GLUCOPHAGE Take 1 tablet (500 mg total) by mouth 2 (two) times daily with a meal.        ROS:  A comprehensive review of systems was negative except for: Musculoskeletal: positive for back pain and stiff joints dry skin  Blood pressure (!) 143/88, pulse 71, temperature (!) 97.5 F (36.4 C), temperature source Temporal, resp. rate 16, weight 257 lb (116.6 kg). Physical Exam    Constitutional: She is oriented to person, place, and time. She appears well-developed and well-nourished.  HENT:  Head: Normocephalic and atraumatic.  Eyes: Pupils are equal, round, and reactive to light. EOM are normal.  Neck: Normal range of motion. Neck supple.  Cardiovascular: Normal rate and regular rhythm.  Pulmonary/Chest: Effort normal and breath sounds normal. Right breast exhibits no inverted nipple, no mass, no nipple discharge, no skin change and no tenderness. Left breast exhibits no inverted nipple, no mass, no nipple discharge, no skin change and no tenderness.  Unable to express any nipple discharge from right or left breast  Abdominal: Soft. She exhibits no distension. There is no tenderness.  Musculoskeletal: Normal range of motion. She exhibits no edema.  Lymphadenopathy:    She has no cervical adenopathy.    She has no axillary adenopathy.  Neurological: She is alert and oriented to person, place, and time.  Skin: Skin is warm and dry.  Psychiatric: She has a normal mood and affect. Her behavior is normal. Judgment and thought content normal.  Vitals reviewed.   Results: Mammogram /US 02/2018  IMPRESSION: No evidence of malignancy in either breast on imaging today. Given the patient's history of spontaneous brownish reddish left nipple discharge, further evaluation is suggested, as findings are suspicious for an occult papilloma or ductal carcinoma in situ.  RECOMMENDATION: Surgical consultation for presumed bloody left nipple discharge is recommended. Bilateral breast MRI with and without contrast should be considered for further evaluation of the nipple discharge, as well as for screening of both breasts given the patient's family history of breast cancer.  I have discussed the findings and recommendations with the patient. Results were also provided in writing at the conclusion of the visit. If applicable, a reminder letter will be sent to the  patient regarding the next appointment.  BI-RADS CATEGORY 2: Benign.  MRI breast 04/2018  IMPRESSION: No MRI evidence of malignancy is identified in either breast. No explanation for the patient's reported left nipple discharge is identified.  RECOMMENDATION: Surgical consultation could be considered given the patient's history of unilateral spontaneous nipple discharge.  Bilateral screening mammogram is recommended in March 2020.  BI-RADS CATEGORY 1: Negative.  Risk Calculator for Breast Cancer 5 yr risk is same as average population at 0.6%; lifetime risk slightly higher at 8.4% compared to 7.6%  Assessment & Plan:  Paula Butler is a 47 y.o. female with spontaneous, unilateral discharge that is brown to bloody in color. She has never had any discharge from the right side. She is unable to tell me if this is coming from a specific duct and  I am unable to express any discharge.  She has underwent negative US/ Mammogram and MRI of the breast.  There is a risk of papilloma versus ductal ectasia causing this nipple discharge and low risk of DCIS or malignancy.  Historically with nipple discharge that could not be located to a single duct a complete retroaerolar excision was formed of the nipple complex.   Recent research has demonstrated that patients with negative US/ mammogram, MRI can be followed for a 2 year period to see if the discharge spontaneously resolves, which occurs 81% of the time. Gunnar Fusi and [place]. Breast Imaging in patients with nipple discharge. [facility] [place]. 2017 Nov-Dec; 50(6): 383-388;  Ashfaq et al. Bernell List study of a modern treatment algorithm for nipple discharge. Am J Surg. 2014; 208: 222-227).  Women can be offered clinical exam and repeat US every 6 months for 2 years with annual mammograms, and if the discharge spontaneously stops they avoid any surgery.    I have discussed this case and research with Dr. Lovell Sheehan, and Dr. Dwain Sarna.  After  talking with Ms. Gunn she has decided to proceed with retro-areola excision of the left breast based on her anxiety and desire to have answers about the possibility of cancer.   All questions were answered to the satisfaction of the patient.  The risk and benefits of retroareola excision of the left breast were discussed including but not limited to bleeding, infection, risk of cancer, risk of needing more surgery, risk of needing radiation, risk of nipple loss/ necrosis.  After careful consideration, Tanica Gaige has decided to Proceed.    Lucretia Roers 04/28/2018, 9:30 AM

## 2018-05-06 ENCOUNTER — Other Ambulatory Visit (HOSPITAL_COMMUNITY)
Admission: RE | Admit: 2018-05-06 | Discharge: 2018-05-06 | Disposition: A | Discharge status: Home / Self Care | Payer: Medicare Other | Source: Ambulatory Visit | Attending: Family Medicine | Admitting: Family Medicine

## 2018-05-06 ENCOUNTER — Encounter: Payer: Self-pay | Admitting: Family Medicine

## 2018-05-06 ENCOUNTER — Encounter: Payer: Self-pay | Admitting: Nutrition

## 2018-05-06 ENCOUNTER — Other Ambulatory Visit: Payer: Self-pay

## 2018-05-06 ENCOUNTER — Encounter: Payer: Medicare Other | Attending: Family Medicine | Admitting: Nutrition

## 2018-05-06 ENCOUNTER — Ambulatory Visit (INDEPENDENT_AMBULATORY_CARE_PROVIDER_SITE_OTHER): Payer: Medicare Other | Admitting: Family Medicine

## 2018-05-06 VITALS — BP 118/80 | HR 77 | Temp 98.5°F | Resp 20 | Ht 65.0 in | Wt 256.0 lb

## 2018-05-06 DIAGNOSIS — Z0000 Encounter for general adult medical examination without abnormal findings: Secondary | ICD-10-CM

## 2018-05-06 DIAGNOSIS — Z01419 Encounter for gynecological examination (general) (routine) without abnormal findings: Secondary | ICD-10-CM | POA: Diagnosis not present

## 2018-05-06 DIAGNOSIS — E039 Hypothyroidism, unspecified: Secondary | ICD-10-CM | POA: Diagnosis not present

## 2018-05-06 DIAGNOSIS — Z6841 Body Mass Index (BMI) 40.0 and over, adult: Secondary | ICD-10-CM | POA: Insufficient documentation

## 2018-05-06 DIAGNOSIS — N941 Unspecified dyspareunia: Secondary | ICD-10-CM

## 2018-05-06 DIAGNOSIS — E1169 Type 2 diabetes mellitus with other specified complication: Secondary | ICD-10-CM | POA: Diagnosis not present

## 2018-05-06 DIAGNOSIS — Z713 Dietary counseling and surveillance: Secondary | ICD-10-CM | POA: Diagnosis not present

## 2018-05-06 DIAGNOSIS — R238 Other skin changes: Secondary | ICD-10-CM | POA: Diagnosis not present

## 2018-05-06 DIAGNOSIS — Z01411 Encounter for gynecological examination (general) (routine) with abnormal findings: Secondary | ICD-10-CM | POA: Diagnosis not present

## 2018-05-06 DIAGNOSIS — E785 Hyperlipidemia, unspecified: Secondary | ICD-10-CM | POA: Insufficient documentation

## 2018-05-06 DIAGNOSIS — Z7984 Long term (current) use of oral hypoglycemic drugs: Secondary | ICD-10-CM | POA: Diagnosis not present

## 2018-05-06 DIAGNOSIS — I10 Essential (primary) hypertension: Secondary | ICD-10-CM | POA: Insufficient documentation

## 2018-05-06 DIAGNOSIS — E119 Type 2 diabetes mellitus without complications: Secondary | ICD-10-CM | POA: Diagnosis not present

## 2018-05-06 DIAGNOSIS — R102 Pelvic and perineal pain: Secondary | ICD-10-CM

## 2018-05-06 DIAGNOSIS — Z79899 Other long term (current) drug therapy: Secondary | ICD-10-CM | POA: Diagnosis not present

## 2018-05-06 MED ORDER — METFORMIN HCL 500 MG PO TABS: 500.0000 mg | ORAL_TABLET | Freq: Two times a day (BID) | ORAL | 1 refills | Status: DC

## 2018-05-06 MED ORDER — LISINOPRIL 2.5 MG PO TABS: 2.5000 mg | ORAL_TABLET | Freq: Every day | ORAL | 1 refills | Status: DC

## 2018-05-06 NOTE — Progress Notes (Signed)
Diabetes Self-Management Education  Visit Type: First/Initial  Appt. Start Time: 1030 Appt. End Time: 1130  05/06/2018  Paula Butler, identified by name and date of birth, is a 47 y.o. female with a diagnosis of Diabetes: Type 2. Sees Dr. Tracie Harrier for PCP. Metformin 500 mg BID. Has recently cut out all snacks, junk food, sodas and now walking daily. Has lost 6 lbs prior to this visit. She is engaged and very motivated. PMH HTN, Hyperlipidemia and possible breast cancer. Schedule to have lumpectomy or biopsy 05-29-18  ASSESSMENT  Height  (1.651 m), weight 256 lb 12.8 oz (116.5 kg). Body mass index is 42.73 kg/m.  Diabetes Self-Management Education - 05/06/18 1027      Visit Information   Visit Type  First/Initial      Initial Visit   Diabetes Type  Type 2    Are you currently following a meal plan?  No    Are you taking your medications as prescribed?  Yes    Date Diagnosed  2015      Health Coping   How would you rate your overall health?  Fair      Psychosocial Assessment   Patient Belief/Attitude about Diabetes  Motivated to manage diabetes    Self-care barriers  None    Self-management support  Family    Other persons present  Patient    Patient Concerns  Nutrition/Meal planning;Medication;Monitoring;Healthy Lifestyle;Problem Solving;Glycemic Control;Weight Control    Special Needs  None    Preferred Learning Style  No preference indicated    Learning Readiness  Change in progress    How often do you need to have someone help you when you read instructions, pamphlets, or other written materials from your doctor or pharmacy?  1 - Never    What is the last grade level you completed in school?  13      Pre-Education Assessment   Patient understands the diabetes disease and treatment process.  Needs Instruction    Patient understands incorporating nutritional management into lifestyle.  Needs Instruction    Patient undertands incorporating physical activity into  lifestyle.  Needs Instruction    Patient understands using medications safely.  Needs Instruction    Patient understands monitoring blood glucose, interpreting and using results  Needs Instruction    Patient understands prevention, detection, and treatment of acute complications.  Needs Instruction    Patient understands prevention, detection, and treatment of chronic complications.  Needs Instruction    Patient understands how to develop strategies to address psychosocial issues.  Needs Instruction    Patient understands how to develop strategies to promote health/change behavior.  Needs Instruction      Complications   Last HgB A1C per patient/outside source  5.9 %    How often do you check your blood sugar?  0 times/day (not testing)    Have you had a dilated eye exam in the past 12 months?  No    Have you had a dental exam in the past 12 months?  No    Are you checking your feet?  Yes    How many days per week are you checking your feet?  7      Dietary Intake   Breakfast  water, eggs, cheese and bacon omelet,  1 slice toast,     Snack (morning)  yogurt,     Lunch  Chicken soup with vegetanles, homemade 2 cup,  water    Snack (afternoon)  banana and apple  Dinner  [place], [place]    Snack (evening)  water    Beverage(s)  water      Exercise   Exercise Type  Light (walking / raking leaves)    How many days per week to you exercise?  7    How many minutes per day do you exercise?  30    Total minutes per week of exercise  210       Individualized Plan for Diabetes Self-Management Training:   Learning Objective:  Patient will have a greater understanding of diabetes self-management. Patient education plan is to attend individual and/or group sessions per assessed needs and concerns.   Plan:   Patient Instructions  Goals 1. Follow My Plate 2. Keep walking 30-60 minutes a day. 3. Cut down to no more than 1 avocacado per day 4.Eat  2-3 carb choices per meal with  vegetables. Eat fruit with meals instead of snack. Take Meformin after breakfast and after supper.    Expected Outcomes:   Improved blood sugar control, reduced complications and better self management.  Education material provided: Living Well with Diabetes, Meal plan card, My Plate and Carbohydrate counting sheet  If problems or questions, patient to contact team via:  Phone and Email  Future DSME appointment:   f/u 1 month.

## 2018-05-06 NOTE — Progress Notes (Signed)
Patient ID: Paula Butler, female    DOB: [birth year], 47 y.o.   MRN: [id number]  Chief Complaint  Patient presents with  . Annual Exam    Allergies Asa [aspirin] and Penicillins  Subjective:   Paula Butler is a 47 y.o. female who presents to [facility] today.  HPI Here for CPE. Reports that her mood is good but she has been a bit stressed b/c mother is in SNF. Reports that has 5 brothers and sisters that live near her mother and feels it is sad that they are not taking care of her. She reports that she  has been sending money to siblings to take care of her mother but they put her in the nursing home anyway. She reports that her mood is good. Has been worried about upcoming breast procedure by Dr. Henreitta Leber. Energy is good. Needs a refill on metformin.  Has gained some weight over the past couple months but believes it is due to stress and has not been following her diet.  Will get her Pap smear done today reports that she would like to be checked for any infections vaginally.  Does not have any vaginal discharge.  Does report that she has had some pain with intercourse over the past year.  It does occur each time that she attempts to have intercourse.  She reports that she has had some pain in the lower pelvic region associated with this.  Reports that her menses have become more sporadic over the past 8 to 9 months.  Has not had any intermenstrual bleeding.  Was started on her cholesterol medicine about 1 month ago.  Is taking the medication each night.  No myalgias.  Does have ophthalmology visit scheduled but is going to have to reschedule this due to surgical procedure for breast that day.  Has an appointment with diabetic nutritionist for this afternoon.   Past Medical History:  Diagnosis Date  . Diabetes mellitus without complication (HCC)   . Hyperlipidemia     Past Surgical History:  Procedure Laterality Date  . CHOLECYSTECTOMY    . TUBAL LIGATION       Family History  Problem Relation Age of Onset  . Diabetes Mother   . Hypothyroidism Mother   . Prostate cancer Father   . Breast cancer Sister   . Stroke Brother      Social History   Socioeconomic History  . Marital status: Married    Spouse name: Not on file  . Number of children: Not on file  . Years of education: Not on file  . Highest education level: Not on file  Occupational History  . Not on file  Social Needs  . Financial resource strain: Not on file  . Food insecurity:    Worry: Not on file    Inability: Not on file  . Transportation needs:    Medical: Not on file    Non-medical: Not on file  Tobacco Use  . Smoking status: Never Smoker  . Smokeless tobacco: Never Used  Substance and Sexual Activity  . Alcohol use: No  . Drug use: No  . Sexual activity: Yes    Partners: Male    Birth control/protection: Surgical  Lifestyle  . Physical activity:    Days per week: Not on file    Minutes per session: Not on file  . Stress: Not on file  Relationships  . Social connections:    Talks on phone: Not on file  Gets together: Not on file    Attends religious service: Not on file    Active member of club or organization: Not on file    Attends meetings of clubs or organizations: Not on file    Relationship status: Not on file  Other Topics Concern  . Not on file  Social History Narrative   Married for 10 years, 3 children, all grown.    2 daughters, 1 son.   Attends church.    Walk.   Sings.   Eats all food groups.   Wears seatbelt.   Born in [place], grew up in [place]. Father in [employer] [place] [employer]. Grandmother raised her.    Moved from [place] in 2018.    Does not work out side of home.     Review of Systems  Constitutional: Negative for activity change, appetite change and fever.  Eyes: Negative for visual disturbance.  Respiratory: Negative for cough, chest tightness and shortness of breath.   Cardiovascular: Negative for chest pain, palpitations and  leg swelling.  Gastrointestinal: Negative for abdominal pain, nausea and vomiting.  Genitourinary: Positive for dyspareunia. Negative for dysuria, frequency and urgency.       Reports that for 2 years has had cramping/pain with intercourse. No menses this month, did have a period last month, but have been a bit irregular/sporadic for 8 months. Has had some intermittent hot flashes. Five months had one episode of some minimal bleeding with sexual intercourse. None since. No STDs. No abnormal pap smears.  No family history of gynecologic malignancy.   Skin:       Reports that she has noticed over the past several months she has had bumps that are coming up on her face.  Has tried to squeeze them but nothing seems to come out.  Neurological: Negative for dizziness, syncope and light-headedness.  Hematological: Negative for adenopathy.  Psychiatric/Behavioral: Negative for dysphoric mood and sleep disturbance. The patient is not nervous/anxious.    Current Outpatient Medications on File Prior to Visit  Medication Sig Dispense Refill  . atorvastatin (LIPITOR) 20 MG tablet Take 1 tablet (20 mg total) by mouth daily. 90 tablet 3  . ibuprofen (ADVIL,MOTRIN) 800 MG tablet Take 1 tablet (800 mg total) by mouth every 8 (eight) hours as needed. 60 tablet 0  . levocetirizine (XYZAL) 5 MG tablet Take 1 tablet (5 mg total) by mouth every evening. 90 tablet 0   No current facility-administered medications on file prior to visit.      Objective:   BP 118/80 (BP Location: Left Arm, Patient Position: Sitting, Cuff Size: Large)   Pulse 77   Temp 98.5 F (36.9 C) (Oral)   Resp 20   Ht  (1.651 m)   Wt 256 lb (116.1 kg)   SpO2 97%   BMI 42.60 kg/m   Physical Exam  Constitutional: She is oriented to person, place, and time. She appears well-developed and well-nourished. No distress.  HENT:  Head: Normocephalic and atraumatic.  Right Ear: External ear normal.  Left Ear: External ear normal.   Nose: Nose normal.  Mouth/Throat: Oropharynx is clear and moist. No oropharyngeal exudate.  Eyes: Pupils are equal, round, and reactive to light. Conjunctivae and EOM are normal. Right eye exhibits no discharge. Left eye exhibits no discharge. No scleral icterus.  Neck: Normal range of motion. Neck supple. No JVD present. No tracheal deviation present. No thyromegaly present.  Cardiovascular: Normal rate, regular rhythm, normal heart sounds and intact distal pulses. Exam reveals no gallop  and no friction rub.  No murmur heard. Pulmonary/Chest: Effort normal and breath sounds normal. No respiratory distress.  Abdominal: Soft. Bowel sounds are normal. She exhibits no distension and no mass. There is no tenderness. There is no rebound and no guarding.  Genitourinary: Vagina normal and uterus normal. There is no rash, tenderness, lesion or injury on the right labia. There is no rash, tenderness or lesion on the left labia. Cervix exhibits no motion tenderness, no discharge and no friability. Right adnexum displays no tenderness and no fullness. Left adnexum displays tenderness and fullness. No erythema or bleeding in the vagina. No foreign body in the vagina. No signs of injury around the vagina. No vaginal discharge found.  Genitourinary Comments: Exam somewhat limited secondary to body habitus.  Musculoskeletal: Normal range of motion. She exhibits no edema.  Lymphadenopathy:    She has no cervical adenopathy.  Neurological: She is alert and oriented to person, place, and time. No cranial nerve deficit.  Skin: Skin is warm and dry. Capillary refill takes less than 2 seconds. No erythema. No pallor.  Multiple scattered flesh-colored/whitish papules over face.  Ranging in diameter from 0.2 to 0.4 mm.   Psychiatric: She has a normal mood and affect. Her behavior is normal. Judgment and thought content normal.  Nursing note and vitals reviewed.  Diabetic Foot Exam - Simple   Simple Foot Form Visual  Inspection No deformities, no ulcerations, no other skin breakdown bilaterally:  Yes Sensation Testing Intact to touch and monofilament testing bilaterally:  Yes Pulse Check Posterior Tibialis and Dorsalis pulse intact bilaterally:  Yes Comments      Depression screen [facility] 2/9 05/06/2018 02/27/2018  Decreased Interest 0 0  Down, Depressed, Hopeless 0 0  PHQ - 2 Score 0 0    Assessment and Plan  1. Well female exam with routine gynecological exam Age-appropriate anticipatory guidance was given and discussed with patient. Diet, exercise, and weight loss modifications discussed. Keep scheduled visit with nutritionist today. Dentist visits recommended every 6 months. Vision exam yearly. Pap, pelvic were performed today. Keep scheduled appointments for breast discharge/nipple discharge.  Breast exam done and documented in office several months ago.  Patient defers today. - Cytology - PAP  2. Type 2 diabetes mellitus with other specified complication, without long-term current use of insulin (HCC) Patient will return to clinic in 2 months and check A1c.  Refill metformin at this time.  We will add lisinopril at this time for renal protection.  Continue statin medication.Patient counseled in detail regarding the risks of medication. Told to call or return to clinic if develop any worrisome signs or symptoms. Patient voiced understanding.  She is unable to take aspirin due to allergy. - metFORMIN (GLUCOPHAGE) 500 MG tablet; Take 1 tablet (500 mg total) by mouth 2 (two) times daily with a meal.  Dispense: 180 tablet; Refill: 1 - lisinopril (ZESTRIL) 2.5 MG tablet; Take 1 tablet (2.5 mg total) by mouth daily.  Dispense: 90 tablet; Refill: 1 - COMPLETE METABOLIC PANEL WITH GFR  3. Dyspareunia, female Will obtain ultrasound at this time and go ahead and place referral to gynecology for evaluation. - Ambulatory referral to Obstetrics / Gynecology  4. Skin papules, generalized Referral to  dermatology placed.  Skin protection discussed with patient and sun avoidance. - Ambulatory referral to Dermatology  5. Adnexal pain Uncertain etiology.  Exam difficult secondary to body habitus with tenderness and questionable fullness on exam of left adnexa today.  Obtain ultrasound - US Pelvis Complete;  Future  6. Hyperlipidemia associated with type 2 diabetes mellitus (HCC) New statin.  Return to clinic in 2 months to check FLP.  Will check LFTs at that time. Hyperlipidemia and the associated risk of ASCVD were discussed today. Primary vs. Secondary prevention of ASCVD were discussed and how it relates to patient morbidity, mortality, and quality of life. Shared decision making with patient including the risks of statins vs.benefits of ASCVD risk reduction discussed.  Risks of stains discussed including myopathy, rhabdomyoloysis, liver problems, increased risk of diabetes discussed. We discussed heart healthy diet, lifestyle modifications, risk factor modifications, and adherence to the recommended treatment plan. We discussed the need to periodically monitor lipid panel and liver function tests while on statin therapy.   - Lipid panel  7. Morbid obesity (HCC) The patient is asked to make an attempt to improve diet and exercise patterns to aid in medical management of this problem.  8. High risk medication use  - Hemoglobin A1c  9. Hypothyroidism, unspecified type Diagnosis of hypothyroidism but off medications for 1 year.  TSH a few months ago was borderline.  Check TSH in 2 months.  Will restart medication if needed. - TSH Recommendations discussed with patient today regarding her health issues.  Follow-up office visit in July. Eye exam scheduled for June.  Check cholesterol in July to make sure LDL is less than 70. Repeat liver test in July at same time.  Recheck thyroid in July since was previously on thyroid medication but has been off about a year.  Recheck diabetes labs in July.   No follow-ups on file. Edwena Bunde, CMA 05/06/2018

## 2018-05-06 NOTE — Patient Instructions (Addendum)
You will get your next pneumonia shot in the Fall when you get your flu shot Get your eyes checked.   Check cholesterol in July to make sure LDL is less than 70. Repeat liver test in July at same time.  Recheck thyroid in July since was previously on thyroid medication but has been off about a year.  Recheck diabetes labs in July.  Dermatology Referral placed-you should  Hear about the appointment.  Get the ultrasound for pelvic issue Keep appointment with surgery for breast issues  Come to lab for fasting blood work 2 days prior to follow up.

## 2018-05-06 NOTE — Patient Instructions (Signed)
Goals 1. Follow My Plate 2. Keep walking 30-60 minutes a day. 3. Cut down to no more than 1 avocacado per day 4.Eat  2-3 carb choices per meal with vegetables. Eat fruit with meals instead of snack. Take Meformin after breakfast and after supper.

## 2018-05-08 ENCOUNTER — Telehealth: Payer: Self-pay | Admitting: Family Medicine

## 2018-05-08 LAB — CYTOLOGY - PAP
CANDIDA VAGINITIS: NEGATIVE
CHLAMYDIA, DNA PROBE: NEGATIVE
DIAGNOSIS: UNDETERMINED — AB
HPV 16/18/45 genotyping: NEGATIVE
HPV: DETECTED — AB
Neisseria Gonorrhea: NEGATIVE
Trichomonas: NEGATIVE

## 2018-05-08 NOTE — Telephone Encounter (Signed)
Please call patient advised that I am recommending that she follow-up with gynecology.  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  I have already done the GYN referral for the pelvic pain and fullness.  However, I would also like for them to evaluate her for this abnormal Pap smear.  I placed this referral at the last visit.  I will let her know when I get the results of the ultrasound back.  However I would like for her to go ahead and make sure she follows up with gynecology/Dr. Emelda Fear.

## 2018-05-12 NOTE — Telephone Encounter (Signed)
Left message for patient requesting call back.

## 2018-05-14 NOTE — Telephone Encounter (Signed)
Advised patient of test results and referral with verbal understanding.

## 2018-05-15 ENCOUNTER — Telehealth: Payer: Self-pay | Admitting: Family Medicine

## 2018-05-15 NOTE — Telephone Encounter (Signed)
Called patient to let her know her ultra sound is scheduled for 05/20/18 at 12:15. She needs to arrive with a full bladder.  No answer, lvm.

## 2018-05-20 ENCOUNTER — Ambulatory Visit (HOSPITAL_COMMUNITY)
Admission: RE | Admit: 2018-05-20 | Discharge: 2018-05-20 | Disposition: A | Discharge status: Home / Self Care | Payer: Medicare Other | Source: Ambulatory Visit | Attending: Family Medicine | Admitting: Family Medicine

## 2018-05-20 DIAGNOSIS — N838 Other noninflammatory disorders of ovary, fallopian tube and broad ligament: Secondary | ICD-10-CM | POA: Diagnosis not present

## 2018-05-20 DIAGNOSIS — R102 Pelvic and perineal pain: Secondary | ICD-10-CM | POA: Insufficient documentation

## 2018-05-20 DIAGNOSIS — N839 Noninflammatory disorder of ovary, fallopian tube and broad ligament, unspecified: Secondary | ICD-10-CM | POA: Diagnosis not present

## 2018-05-21 ENCOUNTER — Telehealth: Payer: Self-pay | Admitting: Family Medicine

## 2018-05-21 DIAGNOSIS — N838 Other noninflammatory disorders of ovary, fallopian tube and broad ligament: Secondary | ICD-10-CM

## 2018-05-21 NOTE — Patient Instructions (Signed)
Paula Butler  05/21/2018     @PREFPERIOPPHARMACY @   Your procedure is scheduled on  05/29/2018   Report to [facility] at  615   A.M.  Call this number if you have problems the morning of surgery:  [phone]   Remember:  No food after midnight.  You may drink clear liquids until 12 midnight 05/28/2018.  Clear liquids allowed are:                    Water, Juice (non-citric and without pulp), Carbonated beverages, Clear Tea, Black Coffee only and Plain Jell-O only    Take these medicines the morning of surgery with A SIP OF WATER  xyzal.    Do not wear jewelry, make-up or nail polish.  Do not wear lotions, powders, or perfumes, or deodorant.  Do not shave 48 hours prior to surgery.  Men may shave face and neck.  Do not bring valuables to the hospital.  [facility] is not responsible for any belongings or valuables.  Contacts, dentures or bridgework may not be worn into surgery.  Leave your suitcase in the car.  After surgery it may be brought to your room.  For patients admitted to the hospital, discharge time will be determined by your treatment team.  Patients discharged the day of surgery will not be allowed to drive home.   Name and phone number of your driver:   family Special instructions:  None Please read over the following fact sheets that you were given. Anesthesia Post-op Instructions and Care and Recovery After Surgery       Incision Care, Adult An incision is a cut that a doctor makes in your skin for surgery (for a procedure). Most times, these cuts are closed after surgery. Your cut from surgery may be closed with stitches (sutures), staples, skin glue, or skin tape (adhesive strips). You may need to return to your doctor to have stitches or staples taken out. This may happen many days or many weeks after your surgery. The cut needs to be well cared for so it does not get infected. How to care for your cut Cut care  Follow instructions from  your doctor about how to take care of your cut. Make sure you: ? Wash your hands with soap and water before you change your bandage (dressing). If you cannot use soap and water, use hand sanitizer. ? Change your bandage as told by your doctor. ? Leave stitches, skin glue, or skin tape in place. They may need to stay in place for 2 weeks or longer. If tape strips get loose and curl up, you may trim the loose edges. Do not remove tape strips completely unless your doctor says it is okay.  Check your cut area every day for signs of infection. Check for: ? More redness, swelling, or pain. ? More fluid or blood. ? Warmth. ? Pus or a bad smell.  Ask your doctor how to clean the cut. This may include: ? Using mild soap and water. ? Using a clean towel to pat the cut dry after you clean it. ? Putting a cream or ointment on the cut. Do this only as told by your doctor. ? Covering the cut with a clean bandage.  Ask your doctor when you can leave the cut uncovered.  Do not take baths, swim, or use a hot tub until your doctor says it is okay. Ask your doctor  if you can take showers. You may only be allowed to take sponge baths for bathing. Medicines  If you were prescribed an antibiotic medicine, cream, or ointment, take the antibiotic or put it on the cut as told by your doctor. Do not stop taking or putting on the antibiotic even if your condition gets better.  Take over-the-counter and prescription medicines only as told by your doctor. General instructions  Limit movement around your cut. This helps healing. ? Avoid straining, lifting, or exercise for the first month, or for as long as told by your doctor. ? Follow instructions from your doctor about going back to your normal activities. ? Ask your doctor what activities are safe.  Protect your cut from the sun when you are outside for the first 6 months, or for as long as told by your doctor. Put on sunscreen around the scar or cover up the  scar.  Keep all follow-up visits as told by your doctor. This is important. Contact a doctor if:  Your have more redness, swelling, or pain around the cut.  You have more fluid or blood coming from the cut.  Your cut feels warm to the touch.  You have pus or a bad smell coming from the cut.  You have a fever or shaking chills.  You feel sick to your stomach (nauseous) or you throw up (vomit).  You are dizzy.  Your stitches or staples come undone. Get help right away if:  You have a red streak coming from your cut.  Your cut bleeds through the bandage and the bleeding does not stop with gentle pressure.  The edges of your cut open up and separate.  You have very bad (severe) pain.  You have a rash.  You are confused.  You pass out (faint).  You have trouble breathing and you have a fast heartbeat. This information is not intended to replace advice given to you by your health care provider. Make sure you discuss any questions you have with your health care provider. Document Released: 02/24/2012 Document Revised: 08/09/2016 Document Reviewed: 08/09/2016 Elsevier Interactive Patient Education  2017 [employer] Inc[employer]  General Anesthesia, Adult General anesthesia is the use of medicines to make a person "go to sleep" (be unconscious) for a medical procedure. General anesthesia is often recommended when a procedure:  Is long.  Requires you to be still or in an unusual position.  Is major and can cause you to lose blood.  Is impossible to do without general anesthesia.  The medicines used for general anesthesia are called general anesthetics. In addition to making you sleep, the medicines:  Prevent pain.  Control your blood pressure.  Relax your muscles.  Tell a health care provider about:  Any allergies you have.  All medicines you are taking, including vitamins, herbs, eye drops, creams, and over-the-counter medicines.  Any problems you or family members have  had with anesthetic medicines.  Types of anesthetics you have had in the past.  Any bleeding disorders you have.  Any surgeries you have had.  Any medical conditions you have.  Any history of heart or lung conditions, such as heart failure, sleep apnea, or chronic obstructive pulmonary disease (COPD).  Whether you are pregnant or may be pregnant.  Whether you use tobacco, alcohol, marijuana, or street drugs.  Any history of Financial planner.  Any history of depression or anxiety. What are the risks? Generally, this is a safe procedure. However, problems may occur, including:  Allergic reaction  to anesthetics.  Lung and heart problems.  Inhaling food or liquids from your stomach into your lungs (aspiration).  Injury to nerves.  Waking up during your procedure and being unable to move (rare).  Extreme agitation or a state of mental confusion (delirium) when you wake up from the anesthetic.  Air in the bloodstream, which can lead to stroke.  These problems are more likely to develop if you are having a major surgery or if you have an advanced medical condition. You can prevent some of these complications by answering all of your health care provider's questions thoroughly and by following all pre-procedure instructions. General anesthesia can cause side effects, including:  Nausea or vomiting  A sore throat from the breathing tube.  Feeling cold or shivery.  Feeling tired, washed out, or achy.  Sleepiness or drowsiness.  Confusion or agitation.  What happens before the procedure? Staying hydrated Follow instructions from your health care provider about hydration, which may include:  Up to 2 hours before the procedure - you may continue to drink clear liquids, such as water, clear fruit juice, black coffee, and plain tea.  Eating and drinking restrictions Follow instructions from your health care provider about eating and drinking, which may include:  8 hours  before the procedure - stop eating heavy meals or foods such as meat, fried foods, or fatty foods.  6 hours before the procedure - stop eating light meals or foods, such as toast or cereal.  6 hours before the procedure - stop drinking milk or drinks that contain milk.  2 hours before the procedure - stop drinking clear liquids.  Medicines  Ask your health care provider about: ? Changing or stopping your regular medicines. This is especially important if you are taking diabetes medicines or blood thinners. ? Taking medicines such as aspirin and ibuprofen. These medicines can thin your blood. Do not take these medicines before your procedure if your health care provider instructs you not to. ? Taking new dietary supplements or medicines. Do not take these during the week before your procedure unless your health care provider approves them.  If you are told to take a medicine or to continue taking a medicine on the day of the procedure, take the medicine with sips of water. General instructions   Ask if you will be going home the same day, the following day, or after a longer hospital stay. ? Plan to have someone take you home. ? Plan to have someone stay with you for the first 24 hours after you leave the hospital or clinic.  For 3-6 weeks before the procedure, try not to use any tobacco products, such as cigarettes, chewing tobacco, and e-cigarettes.  You may brush your teeth on the morning of the procedure, but make sure to spit out the toothpaste. What happens during the procedure?  You will be given anesthetics through a mask and through an IV tube in one of your veins.  You may receive medicine to help you relax (sedative).  As soon as you are asleep, a breathing tube may be used to help you breathe.  An anesthesia specialist will stay with you throughout the procedure. He or she will help keep you comfortable and safe by continuing to give you medicines and adjusting the amount  of medicine that you get. He or she will also watch your blood pressure, pulse, and oxygen levels to make sure that the anesthetics do not cause any problems.  If a breathing tube  was used to help you breathe, it will be removed before you wake up. The procedure may vary among health care providers and hospitals. What happens after the procedure?  You will wake up, often slowly, after the procedure is complete, usually in a recovery area.  Your blood pressure, heart rate, breathing rate, and blood oxygen level will be monitored until the medicines you were given have worn off.  You may be given medicine to help you calm down if you feel anxious or agitated.  If you will be going home the same day, your health care provider may check to make sure you can stand, drink, and urinate.  Your health care providers will treat your pain and side effects before you go home.  Do not drive for 24 hours if you received a sedative.  You may: ? Feel nauseous and vomit. ? Have a sore throat. ? Have mental slowness. ? Feel cold or shivery. ? Feel sleepy. ? Feel tired. ? Feel sore or achy, even in parts of your body where you did not have surgery. This information is not intended to replace advice given to you by your health care provider. Make sure you discuss any questions you have with your health care provider. Document Released: 03/10/2008 Document Revised: 05/14/2016 Document Reviewed: 11/16/2015 Elsevier Interactive Patient Education  2018 [employer] General Anesthesia, Adult, Care After These instructions provide you with information about caring for yourself after your procedure. Your health care provider may also give you more specific instructions. Your treatment has been planned according to current medical practices, but problems sometimes occur. Call your health care provider if you have any problems or questions after your procedure. What can I expect after the procedure? After the  procedure, it is common to have:  Vomiting.  A sore throat.  Mental slowness.  It is common to feel:  Nauseous.  Cold or shivery.  Sleepy.  Tired.  Sore or achy, even in parts of your body where you did not have surgery.  Follow these instructions at home: For at least 24 hours after the procedure:  Do not: ? Participate in activities where you could fall or become injured. ? Drive. ? Use heavy machinery. ? Drink alcohol. ? Take sleeping pills or medicines that cause drowsiness. ? Make important decisions or sign legal documents. ? Take care of children on your own.  Rest. Eating and drinking  If you vomit, drink water, juice, or soup when you can drink without vomiting.  Drink enough fluid to keep your urine clear or pale yellow.  Make sure you have little or no nausea before eating solid foods.  Follow the diet recommended by your health care provider. General instructions  Have a responsible adult stay with you until you are awake and alert.  Return to your normal activities as told by your health care provider. Ask your health care provider what activities are safe for you.  Take over-the-counter and prescription medicines only as told by your health care provider.  If you smoke, do not smoke without supervision.  Keep all follow-up visits as told by your health care provider. This is important. Contact a health care provider if:  You continue to have nausea or vomiting at home, and medicines are not helpful.  You cannot drink fluids or start eating again.  You cannot urinate after 8-12 hours.  You develop a skin rash.  You have fever.  You have increasing redness at the site of your procedure. Get  help right away if:  You have difficulty breathing.  You have chest pain.  You have unexpected bleeding.  You feel that you are having a life-threatening or urgent problem. This information is not intended to replace advice given to you by your  health care provider. Make sure you discuss any questions you have with your health care provider. Document Released: 03/10/2001 Document Revised: 05/06/2016 Document Reviewed: 11/16/2015 Elsevier Interactive Patient Education  [employer].

## 2018-05-21 NOTE — Telephone Encounter (Signed)
Please call and advise that I have reviewed the results of the pelvic ultrasound did reveal:   2.6 cm left ovarian mass. This may represent a complicated cyst, however solid mass cannot be entirely excluded.  At this point, I will go ahead and refer her to gynecology and we will obtain an MRI of the pelvis prior to the appointment.

## 2018-05-21 NOTE — Telephone Encounter (Signed)
Left message requesting  call back.

## 2018-05-22 ENCOUNTER — Encounter: Payer: Self-pay | Admitting: Family Medicine

## 2018-05-22 NOTE — Telephone Encounter (Signed)
Spoke with patient and gave ultrasound results with verbal understanding 

## 2018-05-25 ENCOUNTER — Encounter (HOSPITAL_COMMUNITY): Payer: Self-pay

## 2018-05-25 ENCOUNTER — Encounter (HOSPITAL_COMMUNITY)
Admission: RE | Admit: 2018-05-25 | Discharge: 2018-05-25 | Disposition: A | Discharge status: Home / Self Care | Payer: Medicare Other | Source: Ambulatory Visit | Attending: General Surgery | Admitting: General Surgery

## 2018-05-25 ENCOUNTER — Other Ambulatory Visit: Payer: Self-pay

## 2018-05-25 DIAGNOSIS — N6452 Nipple discharge: Secondary | ICD-10-CM | POA: Insufficient documentation

## 2018-05-25 DIAGNOSIS — Z7984 Long term (current) use of oral hypoglycemic drugs: Secondary | ICD-10-CM | POA: Diagnosis not present

## 2018-05-25 DIAGNOSIS — Z01812 Encounter for preprocedural laboratory examination: Secondary | ICD-10-CM | POA: Insufficient documentation

## 2018-05-25 DIAGNOSIS — Z79899 Other long term (current) drug therapy: Secondary | ICD-10-CM | POA: Diagnosis not present

## 2018-05-25 DIAGNOSIS — E119 Type 2 diabetes mellitus without complications: Secondary | ICD-10-CM | POA: Diagnosis not present

## 2018-05-25 DIAGNOSIS — E785 Hyperlipidemia, unspecified: Secondary | ICD-10-CM | POA: Insufficient documentation

## 2018-05-25 LAB — BASIC METABOLIC PANEL
Anion gap: 8 (ref 5–15)
BUN: 16 mg/dL (ref 6–20)
CO2: 26 mmol/L (ref 22–32)
CREATININE: 0.76 mg/dL (ref 0.44–1.00)
Calcium: 9.3 mg/dL (ref 8.9–10.3)
Chloride: 108 mmol/L (ref 101–111)
GFR calc Af Amer: >60 mL/min (ref >60)
GLUCOSE: 106 mg/dL — AB (ref 65–99)
POTASSIUM: 3.9 mmol/L (ref 3.5–5.1)
Sodium: 142 mmol/L (ref 135–145)

## 2018-05-25 LAB — CBC WITH DIFFERENTIAL/PLATELET
Basophils Absolute: 0 10*3/uL (ref 0.0–0.1)
Basophils Relative: 0 %
EOS ABS: 0.3 10*3/uL (ref 0.0–0.7)
Eosinophils Relative: 4 %
HCT: 41.9 % (ref 36.0–46.0)
Hemoglobin: 13.5 g/dL (ref 12.0–15.0)
LYMPHS ABS: 1.7 10*3/uL (ref 0.7–4.0)
LYMPHS PCT: 26 %
MCH: 28.8 pg (ref 26.0–34.0)
MCHC: 32.2 g/dL (ref 30.0–36.0)
MCV: 89.5 fL (ref 78.0–100.0)
MONO ABS: 0.6 10*3/uL (ref 0.1–1.0)
MONOS PCT: 9 %
Neutro Abs: 3.9 10*3/uL (ref 1.7–7.7)
Neutrophils Relative %: 61 %
PLATELETS: 137 10*3/uL — AB (ref 150–400)
RBC: 4.68 MIL/uL (ref 3.87–5.11)
RDW: 14.6 % (ref 11.5–15.5)
WBC: 6.4 10*3/uL (ref 4.0–10.5)

## 2018-05-25 LAB — GLUCOSE, CAPILLARY: Glucose-Capillary: 117 mg/dL — ABNORMAL HIGH (ref 65–99)

## 2018-05-25 LAB — HCG, SERUM, QUALITATIVE: PREG SERUM: NEGATIVE

## 2018-05-25 LAB — HEMOGLOBIN A1C
Hgb A1c MFr Bld: 5.7 % — ABNORMAL HIGH (ref 4.8–5.6)
Mean Plasma Glucose: 116.89 mg/dL

## 2018-05-26 ENCOUNTER — Telehealth: Payer: Self-pay

## 2018-05-26 NOTE — Telephone Encounter (Signed)
Left message with upcoming MRI information  [facility]t [facility] on 06/08/18 at 11:00 Arrive at 10:45 am to register Call [phone] to reschedule if this date and time doesn't work for her

## 2018-05-28 ENCOUNTER — Encounter: Payer: Medicare Other | Admitting: Adult Health

## 2018-05-29 ENCOUNTER — Encounter (HOSPITAL_COMMUNITY)
Admission: RE | Disposition: A | Discharge status: Home / Self Care | Payer: Self-pay | Source: Ambulatory Visit | Attending: General Surgery

## 2018-05-29 ENCOUNTER — Ambulatory Visit (HOSPITAL_COMMUNITY)
Admission: RE | Admit: 2018-05-29 | Discharge: 2018-05-29 | Disposition: A | Discharge status: Home / Self Care | Payer: Medicare Other | Source: Ambulatory Visit | Attending: General Surgery | Admitting: General Surgery

## 2018-05-29 ENCOUNTER — Ambulatory Visit (HOSPITAL_COMMUNITY): Payer: Medicare Other | Admitting: Anesthesiology

## 2018-05-29 DIAGNOSIS — Z880 Allergy status to penicillin: Secondary | ICD-10-CM | POA: Diagnosis not present

## 2018-05-29 DIAGNOSIS — Z803 Family history of malignant neoplasm of breast: Secondary | ICD-10-CM | POA: Diagnosis not present

## 2018-05-29 DIAGNOSIS — E785 Hyperlipidemia, unspecified: Secondary | ICD-10-CM | POA: Diagnosis not present

## 2018-05-29 DIAGNOSIS — F419 Anxiety disorder, unspecified: Secondary | ICD-10-CM | POA: Diagnosis not present

## 2018-05-29 DIAGNOSIS — E039 Hypothyroidism, unspecified: Secondary | ICD-10-CM | POA: Insufficient documentation

## 2018-05-29 DIAGNOSIS — N6012 Diffuse cystic mastopathy of left breast: Secondary | ICD-10-CM | POA: Diagnosis not present

## 2018-05-29 DIAGNOSIS — N6452 Nipple discharge: Secondary | ICD-10-CM | POA: Diagnosis not present

## 2018-05-29 DIAGNOSIS — Z79899 Other long term (current) drug therapy: Secondary | ICD-10-CM | POA: Diagnosis not present

## 2018-05-29 DIAGNOSIS — Z7984 Long term (current) use of oral hypoglycemic drugs: Secondary | ICD-10-CM | POA: Diagnosis not present

## 2018-05-29 DIAGNOSIS — E119 Type 2 diabetes mellitus without complications: Secondary | ICD-10-CM | POA: Insufficient documentation

## 2018-05-29 DIAGNOSIS — Z886 Allergy status to analgesic agent status: Secondary | ICD-10-CM | POA: Insufficient documentation

## 2018-05-29 DIAGNOSIS — N611 Abscess of the breast and nipple: Secondary | ICD-10-CM | POA: Diagnosis not present

## 2018-05-29 HISTORY — PX: EXCISION OF BREAST BIOPSY: SHX5822

## 2018-05-29 LAB — GLUCOSE, CAPILLARY
GLUCOSE-CAPILLARY: 143 mg/dL — AB (ref 65–99)
Glucose-Capillary: 113 mg/dL — ABNORMAL HIGH (ref 65–99)

## 2018-05-29 SURGERY — EXCISION OF BREAST BIOPSY
Anesthesia: General | Site: Breast | Laterality: Left

## 2018-05-29 MED ORDER — CHLORHEXIDINE GLUCONATE CLOTH 2 % EX PADS: 6.0000 | MEDICATED_PAD | Freq: Once | CUTANEOUS | Status: DC

## 2018-05-29 MED ORDER — MEPERIDINE HCL 50 MG/ML IJ SOLN: 6.2500 mg | INTRAMUSCULAR | Status: DC | PRN

## 2018-05-29 MED ORDER — PROPOFOL 10 MG/ML IV BOLUS
INTRAVENOUS | Status: DC | PRN
  Administered 2018-05-29: 150 mg via INTRAVENOUS
  Administered 2018-05-29: 50 mg via INTRAVENOUS

## 2018-05-29 MED ORDER — HYDROCODONE-ACETAMINOPHEN 7.5-325 MG PO TABS
1.0000 | ORAL_TABLET | Freq: Once | ORAL | Status: AC | PRN
  Administered 2018-05-29: 1 via ORAL
  Filled 2018-05-29: qty 1

## 2018-05-29 MED ORDER — ROCURONIUM BROMIDE 50 MG/5ML IV SOLN
INTRAVENOUS | Status: AC
  Filled 2018-05-29: qty 3

## 2018-05-29 MED ORDER — OXYCODONE HCL 5 MG PO TABS: 5.0000 mg | ORAL_TABLET | ORAL | 0 refills | Status: DC | PRN

## 2018-05-29 MED ORDER — ONDANSETRON HCL 4 MG/2ML IJ SOLN
INTRAMUSCULAR | Status: DC | PRN
  Administered 2018-05-29: 4 mg via INTRAVENOUS

## 2018-05-29 MED ORDER — DOCUSATE SODIUM 100 MG PO CAPS: 100.0000 mg | ORAL_CAPSULE | Freq: Two times a day (BID) | ORAL | 2 refills | Status: DC

## 2018-05-29 MED ORDER — LACTATED RINGERS IV SOLN: INTRAVENOUS | Status: DC

## 2018-05-29 MED ORDER — LIDOCAINE HCL (PF) 1 % IJ SOLN
INTRAMUSCULAR | Status: AC
  Filled 2018-05-29: qty 15

## 2018-05-29 MED ORDER — SUCCINYLCHOLINE CHLORIDE 20 MG/ML IJ SOLN
INTRAMUSCULAR | Status: AC
  Filled 2018-05-29: qty 1

## 2018-05-29 MED ORDER — LACTATED RINGERS IV SOLN
INTRAVENOUS | Status: DC
  Administered 2018-05-29: 07:00:00 via INTRAVENOUS

## 2018-05-29 MED ORDER — HYDROMORPHONE HCL 1 MG/ML IJ SOLN
0.2500 mg | INTRAMUSCULAR | Status: DC | PRN
  Administered 2018-05-29 (×2): 0.5 mg via INTRAVENOUS
  Filled 2018-05-29: qty 0.5

## 2018-05-29 MED ORDER — HYDROMORPHONE HCL 1 MG/ML IJ SOLN
INTRAMUSCULAR | Status: AC
  Filled 2018-05-29: qty 0.5

## 2018-05-29 MED ORDER — MIDAZOLAM HCL 2 MG/2ML IJ SOLN
INTRAMUSCULAR | Status: AC
  Filled 2018-05-29: qty 2

## 2018-05-29 MED ORDER — FENTANYL CITRATE (PF) 100 MCG/2ML IJ SOLN
INTRAMUSCULAR | Status: DC | PRN
  Administered 2018-05-29 (×4): 25 ug via INTRAVENOUS
  Administered 2018-05-29: 50 ug via INTRAVENOUS

## 2018-05-29 MED ORDER — LIDOCAINE HCL (CARDIAC) PF 50 MG/5ML IV SOSY
PREFILLED_SYRINGE | INTRAVENOUS | Status: DC | PRN
  Administered 2018-05-29: 40 mg via INTRAVENOUS

## 2018-05-29 MED ORDER — MIDAZOLAM HCL 5 MG/5ML IJ SOLN
INTRAMUSCULAR | Status: DC | PRN
  Administered 2018-05-29: 2 mg via INTRAVENOUS

## 2018-05-29 MED ORDER — VANCOMYCIN HCL IN DEXTROSE 1-5 GM/200ML-% IV SOLN
1000.0000 mg | INTRAVENOUS | Status: AC
  Administered 2018-05-29: 1000 mg via INTRAVENOUS
  Filled 2018-05-29: qty 200

## 2018-05-29 MED ORDER — PROMETHAZINE HCL 25 MG/ML IJ SOLN: 6.2500 mg | INTRAMUSCULAR | Status: DC | PRN

## 2018-05-29 MED ORDER — SODIUM CHLORIDE 0.9 % IR SOLN
Status: DC | PRN
  Administered 2018-05-29: 1000 mL

## 2018-05-29 MED ORDER — BUPIVACAINE HCL (PF) 0.5 % IJ SOLN
INTRAMUSCULAR | Status: AC
  Filled 2018-05-29: qty 30

## 2018-05-29 MED ORDER — PROPOFOL 10 MG/ML IV BOLUS
INTRAVENOUS | Status: AC
Start: 2018-05-29 — End: ?
  Filled 2018-05-29: qty 40

## 2018-05-29 MED ORDER — FENTANYL CITRATE (PF) 250 MCG/5ML IJ SOLN
INTRAMUSCULAR | Status: AC
  Filled 2018-05-29: qty 5

## 2018-05-29 MED ORDER — BUPIVACAINE HCL (PF) 0.5 % IJ SOLN
INTRAMUSCULAR | Status: DC | PRN
  Administered 2018-05-29: 10 mL

## 2018-05-29 SURGICAL SUPPLY — 31 items
CHLORAPREP W/TINT 26ML (MISCELLANEOUS) ×2 IMPLANT
CLOTH BEACON ORANGE TIMEOUT ST (SAFETY) ×2 IMPLANT
COVER LIGHT HANDLE STERIS (MISCELLANEOUS) ×4 IMPLANT
DERMABOND ADVANCED (GAUZE/BANDAGES/DRESSINGS) ×1
DERMABOND ADVANCED .7 DNX12 (GAUZE/BANDAGES/DRESSINGS) ×1 IMPLANT
ELECT REM PT RETURN 9FT ADLT (ELECTROSURGICAL) ×2
ELECTRODE REM PT RTRN 9FT ADLT (ELECTROSURGICAL) ×1 IMPLANT
FORMALIN 10 PREFIL 120ML (MISCELLANEOUS) ×2 IMPLANT
GLOVE BIO SURGEON STRL SZ 6.5 (GLOVE) ×4 IMPLANT
GLOVE BIO SURGEON STRL SZ7 (GLOVE) ×2 IMPLANT
GLOVE BIOGEL PI IND STRL 6.5 (GLOVE) ×1 IMPLANT
GLOVE BIOGEL PI IND STRL 7.0 (GLOVE) ×1 IMPLANT
GLOVE BIOGEL PI INDICATOR 6.5 (GLOVE) ×1
GLOVE BIOGEL PI INDICATOR 7.0 (GLOVE) ×1
GOWN STRL REUS W/TWL LRG LVL3 (GOWN DISPOSABLE) ×4 IMPLANT
KIT TURNOVER KIT A (KITS) ×2 IMPLANT
MANIFOLD NEPTUNE II (INSTRUMENTS) ×2 IMPLANT
NEEDLE HYPO 18GX1.5 BLUNT FILL (NEEDLE) ×2 IMPLANT
NEEDLE HYPO 25X1 1.5 SAFETY (NEEDLE) ×2 IMPLANT
NS IRRIG 1000ML POUR BTL (IV SOLUTION) ×2 IMPLANT
PACK MINOR (CUSTOM PROCEDURE TRAY) ×2 IMPLANT
PAD ARMBOARD 7.5X6 YLW CONV (MISCELLANEOUS) ×2 IMPLANT
SET BASIN LINEN APH (SET/KITS/TRAYS/PACK) ×2 IMPLANT
SPONGE GAUZE 2X2 8PLY STRL LF (GAUZE/BANDAGES/DRESSINGS) IMPLANT
STRIP CLOSURE SKIN 1/4X3 (GAUZE/BANDAGES/DRESSINGS) IMPLANT
SUT MNCRL AB 4-0 PS2 18 (SUTURE) ×2 IMPLANT
SUT SILK 2 0 SH (SUTURE) ×2 IMPLANT
SUT VIC AB 3-0 SH 27 (SUTURE) ×2
SUT VIC AB 3-0 SH 27X BRD (SUTURE) ×2 IMPLANT
SYR 10ML LL (SYRINGE) ×2 IMPLANT
SYR BULB IRRIGATION 50ML (SYRINGE) ×2 IMPLANT

## 2018-05-29 NOTE — Anesthesia Preprocedure Evaluation (Signed)
Anesthesia Evaluation  Patient identified by MRN, date of birth, ID band Patient awake    Reviewed: Allergy & Precautions, H&P , NPO status , Patient's Chart, lab work & pertinent test results, reviewed documented beta blocker date and time   Airway Mallampati: II  TM Distance: >3 FB Neck ROM: full    Dental no notable dental hx. (+) Teeth Intact   Pulmonary neg pulmonary ROS,    Pulmonary exam normal breath sounds clear to auscultation       Cardiovascular Exercise Tolerance: Good hypertension, On Medications negative cardio ROS   Rhythm:regular Rate:Normal     Neuro/Psych negative neurological ROS  negative psych ROS   GI/Hepatic negative GI ROS, Neg liver ROS,   Endo/Other  negative endocrine ROSdiabetes, Well Controlled, Type obesity  Renal/GU negative Renal ROS  negative genitourinary   Musculoskeletal   Abdominal   Peds  Hematology negative hematology ROS (+)   Anesthesia Other Findings Newly dx and tx HTn x 2 months.  No h/o angina or dyspnea  Reproductive/Obstetrics negative OB ROS                             Anesthesia Physical Anesthesia Plan  ASA: III  Anesthesia Plan: General LMA   Post-op Pain Management:    Induction:   PONV Risk Score and Plan: Ondansetron  Airway Management Planned:   Additional Equipment:   Intra-op Plan:   Post-operative Plan:   Informed Consent: I have reviewed the patients History and Physical, chart, labs and discussed the procedure including the risks, benefits and alternatives for the proposed anesthesia with the patient or authorized representative who has indicated his/her understanding and acceptance.   Dental Advisory Given  Plan Discussed with: CRNA and Anesthesiologist  Anesthesia Plan Comments:         Anesthesia Quick Evaluation

## 2018-05-29 NOTE — Interval H&P Note (Signed)
History and Physical Interval Note:  05/29/2018 7:26 AM  Timmie FoersterAlicia A Kantor  has presented today for surgery, with the diagnosis of left nipple discharge  The various methods of treatment have been discussed with the patient and family. After consideration of risks, benefits and other options for treatment, the patient has consented to  Procedure(s): NIPPLE EXPLORATION/EXCISION RETROAREOLA TISSUE LEFT BREAST (Left) as a surgical intervention .  The patient's history has been reviewed, patient examined, no change in status, stable for surgery.  I have reviewed the patient's chart and labs.  Questions were answered to the patient's satisfaction.    Still with drainage, cannot locate to 1 duct, reports drainage is light brownish since I saw her. She is nervous. We went over the risk again including bleeding, infection, risk of more surgery, radiation, risk of necrosis of the nipple. She wants to proceed. She knows other options of surveillance but due to family history wants to know for sure.  Lucretia RoersLindsay C Stanford Strauch

## 2018-05-29 NOTE — Op Note (Signed)
[facility] Operative Note  05/29/18  Preoperative Diagnosis:  Bloody left nipple discharge (unknown duct)    Postoperative Diagnosis: Same   Procedure(s) Performed: Nipple exploration and Excision of left retro-areolar duct system    Surgeon: Leatrice Jewels. Henreitta Leber, MD   Assistants: No qualified resident was available   Anesthesia: General endotracheal   Anesthesiologist: Arbie Cookey, MD    Specimens: Retroareolar duct system (double short superior, single long lateral, double long distal duct system)   Estimated Blood Loss: Minimal   Blood Replacement: None    Complications: None   Wound Class: Clean    Operative Indications: Paula Butler is a 47 yo with brownish to bloody nipple discharge from the left breast only that has been persistent. She has undergone workup including mammogram, ultrasound biopsy, and MRI that have been unrevealing. She is very nervous about the possibility of cancer, and we have discussed the options of surveillance. Given her anxiety she wants to proceed with complete duct excision on the left (given that a single duct cannot be identified by her or on my exam).  After a discussion of the risk and benefits of surgery, including but not limited to bleeding, infection, nipple necrosis, need for more surgery, need for radiation, she has opted to proceed.   Findings: Ductal system with a possible palpable mass inferior which was excised    Procedure: The patient was taken to the operating room and placed supine. General endotracheal anesthesia was induced. Intravenous antibiotics were administered per protocol.  The left breast was prepared and draped in the usual sterile fashion.   An incision was made around the inferior areolar. Care was taken to leave adequate thickness of the areolar flap to preserve vascularity and prevent against necrosis.  This was all done with sharp dissection.  Minimal cautery was used near the areolar  complex.  Once to the level of the duct system, Metzenbaum scissors were used to encircle the duct, and a kelly clamp was used to clamp across the distal aspect of the duct system.  The ductal system was sharply cut from the underlying nipple, preserving adequate thickness.  Once the entire areola flap was created, the ductal system was coned down toward the chest wall using a combination of electrocautery and sharp dissection.  Deep to the ductal system there seemed to be a palpable mass, this was included in the specimen, as I could not differentiate if this was the termination of the ducts or a correspond subareolar mass that had not been identified on the imaging. Once the complete ductal system was removed. The specimen was labeled single long lateral, double short superior, and double long at th distal duct system.  Hemostasis was achieved. The breast was large enough and had enough mass that it enclosed in on the cavity that was created.  Interrupted 3-0 Vicryl suture was used in closing this cavity to prevent the nipple complex from invaginating inward.  Once this was completed, the superior portion of the areolar skin flap was extended to allow for adequate position of the areola back over the breast tissue. The deep layer was re-approximated with 3-0 Vicryl interrupted suture and the areola skin was re-approximated with 4-0 Monocryl running suture and dermabond.    Final inspection revealed acceptable hemostasis. All counts were correct at the end of the case. The patient was awakened from anesthesia and extubate without complication.  The patient went to the PACU in stable condition.   Algis Greenhouse, MD [facility] [address] [place], [place] [id number] [phone] (office)

## 2018-05-29 NOTE — Discharge Instructions (Signed)
Instrucciones de descarga:  Ducha segn su rutina habitual. No sumergirse en el bao.  Tome tylenol e ibuprofeno segn sea necesario para controlar el dolor, alternando cada 4-6 horas.  Tome Roxicodone para el dolor irruptivo.  Tome colace para el estreimiento relacionado con medicamentos narcticos para Chief Technology Officer.  No recoja el pegamento Dermabond en sus sitios de incisin. Use un cmodo sujetador suelto para apoyo.  Surgeria de mama, cuidados posteriores Estas indicaciones le proporcionan informacin acerca de cmo deber cuidarse despus del procedimiento. El mdico tambin podr darle instrucciones especficas. Comunquese con el mdico si tiene algn problema o tiene preguntas despus del procedimiento. CUIDADOS EN EL HOGAR Medicamentos  [employer] de venta libre y los recetados solamente como se lo haya indicado el mdico.  No conduzca durante 24horas si le administraron un sedante.  No beba alcohol si toma analgsicos.  No conduzca ni use maquinaria pesada mientras toma analgsicos recetados. Cuidado del Environmental consultant de la biopsia  Siga las indicaciones del mdico en lo que respecta al cuidado del corte de la ciruga (incisin) o el lugar de la puncin. Haga lo siguiente: ? Lvese las manos con agua y jabn antes de cambiarse la venda. Use un desinfectante para manos si no dispone de [place] y [place]. ? Cambie el vendaje como se lo haya indicado el mdico. ? No retire los puntos (suturas), el [employer] para la piel o las tiras [place]. Tal vez deban dejarse puestos en la piel durante 2semanas o ms tiempo. Si las tiras [place] se despegan y se enroscan, puede recortar los bordes sueltos. No retire las tiras [employer] por completo a menos que el mdico lo autorice.  Si le colocaron puntos, djelos secar cuando tome un bao o una ducha.  Controle el corte o Immunologist de la puncin todos los [address]  [address] para detectar signos de infeccin. Est atenta a los siguientes signos: ? Aumento  del enrojecimiento, la hinchazn o Chief Technology Officer. ? Ms lquido [place]. ? Calor. ? Pus o mal olor.  Proteja la zona de la biopsia. No deje que la zona se inflame. Actividad  Evite las actividades que podran tironear y abrir el sitio de la biopsia. ? No elongue. ? No se estire. ? No haga ejercicios. ? No practique deportes. ? Evite levantar objetos que pesen ms de 3libras (1,4kg).  Retome sus actividades habituales como se lo haya indicado el mdico. Pregntele al mdico qu actividades son seguras para usted. Instrucciones generales  Siga su dieta habitual.  Use un buen sostn de soporte durante el tiempo que le indique su mdico.  Realcese controles para detectar exceso de lquido en el cuerpo (linfedema) con la frecuencia que le haya indicado el mdico.  Concurra a todas las visitas de control como se lo haya indicado el mdico. Esto es importante. SOLICITE AYUDA SI:  Tiene ms enrojecimiento, hinchazn o dolor en el lugar de la biopsia.  Observa ms lquido o sangre que salen del sitio de la biopsia.  El sitio de la biopsia est caliente al tacto.  Tiene pus o percibe mal olor que proviene del lugar de la biopsia.  El Environmental consultant de la biopsia se abre despus de que le han retirado los puntos, las grapas o las tiras [place].  Tiene una erupcin cutnea.  Tiene fiebre. SOLICITE AYUDA DE INMEDIATO SI:  Aumenta el sangrado (ms de una pequea mancha) en el lugar de la biopsia.  Tiene dificultad para respirar.  Tiene rayas rojas alrededor del lugar de la biopsia. Esta informacin  no tiene Theme park managercomo fin reemplazar el consejo del mdico. Asegrese de hacerle al mdico cualquier pregunta que tenga. Document Released: 06/02/2012 Document Revised: 03/25/2016 Document Reviewed: 09/05/2015 Elsevier Interactive Patient Education  [employer]2018 Elsevier Inc.

## 2018-05-29 NOTE — Anesthesia Postprocedure Evaluation (Signed)
Anesthesia Post Note  Patient: Paula Butler  Procedure(s) Performed: NIPPLE EXPLORATION/EXCISION RETROAREOLA TISSUE LEFT BREAST (Left Breast)  Patient location during evaluation: PACU Anesthesia Type: General Level of consciousness: awake and alert and oriented Pain management: pain level controlled Vital Signs Assessment: post-procedure vital signs reviewed and stable Respiratory status: spontaneous breathing Cardiovascular status: blood pressure returned to baseline and stable Postop Assessment: no apparent nausea or vomiting Anesthetic complications: no Comments: Late entry.     Last Vitals:  Vitals:   05/29/18 0945 05/29/18 1004  BP: 114/65 106/69  Pulse: 80 70  Resp: 15 16  Temp:  36.6 C  SpO2: 96% 97%    Last Pain:  Vitals:   05/29/18 1004  TempSrc: Oral  PainSc: 5                  Tionne Carelli

## 2018-05-29 NOTE — Anesthesia Procedure Notes (Signed)
Procedure Name: LMA Insertion Date/Time: 05/29/2018 7:44 AM Performed by: Moshe Salisburyaniel, Ida Milbrath E, CRNA Pre-anesthesia Checklist: Patient identified, Patient being monitored, Emergency Drugs available, Timeout performed and Suction available Patient Re-evaluated:Patient Re-evaluated prior to induction Oxygen Delivery Method: Circle System Utilized Preoxygenation: Pre-oxygenation with 100% oxygen Induction Type: IV induction Ventilation: Mask ventilation without difficulty LMA: LMA inserted LMA Size: 3.0 Number of attempts: 1 Placement Confirmation: positive ETCO2 and breath sounds checked- equal and bilateral

## 2018-05-29 NOTE — Transfer of Care (Signed)
Immediate Anesthesia Transfer of Care Note  Patient: Paula Butler  Procedure(s) Performed: NIPPLE EXPLORATION/EXCISION RETROAREOLA TISSUE LEFT BREAST (Left Breast)  Patient Location: PACU  Anesthesia Type:General  Level of Consciousness: awake and alert   Airway & Oxygen Therapy: Patient Spontanous Breathing and Patient connected to face mask oxygen  Post-op Assessment: Report given to RN  Post vital signs: Reviewed and stable  Last Vitals:  Vitals Value Taken Time  BP    Temp    Pulse    Resp    SpO2      Last Pain:  Vitals:   05/29/18 0651  TempSrc: Oral  PainSc: 0-No pain      Patients Stated Pain Goal: 6 (05/29/18 16100651)  Complications: No apparent anesthesia complications

## 2018-06-01 ENCOUNTER — Encounter (HOSPITAL_COMMUNITY): Payer: Self-pay | Admitting: General Surgery

## 2018-06-03 ENCOUNTER — Telehealth: Payer: Self-pay | Admitting: Family Medicine

## 2018-06-03 NOTE — Telephone Encounter (Signed)
Patient is requesting her MRI be scheduled w/ [facility] or at a facility with an open MRI. [phone]

## 2018-06-04 ENCOUNTER — Telehealth: Payer: Self-pay | Admitting: Family Medicine

## 2018-06-04 NOTE — Telephone Encounter (Signed)
Pt would like 800mg  ibuprofen called in

## 2018-06-05 ENCOUNTER — Telehealth: Payer: Self-pay | Admitting: General Surgery

## 2018-06-05 NOTE — Telephone Encounter (Addendum)
[facility]  Diagnosis Breast, excision, left retroareolar duct - DILATED DUCT, SEE COMMENT. - FIBROCYSTIC CHANGE. - NO MALIGNANCY IDENTIFIED. Microscopic Comment There is a dilated duct, but no papillary lesion or fragments.  Gross Specimen type: Left breast retroareolar duct excision., time in formalin unknown. Size: 7 x 5 cm, and is up to 3.7 cm thick. Orientation: There is a single long suture at lateral, double short suture at superior, and double long suture identifying distal duct. The double long suture at distal duct also corresponds to anterior. The specimen is inked as follows: green anterior, blue inferior, orange lateral, yellow medial, black posterior, red superior. Localized area: None. Cut surface: There is predominately soft fatty tissue with a focal area of gray white soft rubbery and vaguely nodularfibrous tissue, which also contains a 0.4 cm in diameter duct, which contains soft gray brown material. A discrete lesionor mass is not identified. The area of nodular fibrous tissue is 2.8 x 1.4 x 1.1 cm. Margins: The area of vaguely nodular fibrous tissue with duct abuts portions of anterior and superior margins. Prognostic indicators: N/A  No cancer and no papillary lesions. Looks like dilated duct with some gray/ brown material -likely her discharge in the duct.   Left messages x2.   Algis GreenhouseLindsay Dymin Dingledine, MD [facility] [address] [place], [place] [phone] (office)

## 2018-06-08 ENCOUNTER — Ambulatory Visit (HOSPITAL_COMMUNITY): Admission: RE | Admit: 2018-06-08 | Payer: Medicare Other | Source: Ambulatory Visit

## 2018-06-08 NOTE — Telephone Encounter (Signed)
Please advise she may use ibuprofen OTC dosing as needed. Janine Limboachel H. Tracie HarrierHagler, MD

## 2018-06-10 ENCOUNTER — Encounter: Payer: Self-pay | Admitting: Adult Health

## 2018-06-10 ENCOUNTER — Encounter (INDEPENDENT_AMBULATORY_CARE_PROVIDER_SITE_OTHER): Payer: Self-pay

## 2018-06-10 ENCOUNTER — Ambulatory Visit (INDEPENDENT_AMBULATORY_CARE_PROVIDER_SITE_OTHER): Payer: Medicare Other | Admitting: Adult Health

## 2018-06-10 VITALS — BP 119/75 | HR 79 | Ht 65.0 in | Wt 254.5 lb

## 2018-06-10 DIAGNOSIS — N838 Other noninflammatory disorders of ovary, fallopian tube and broad ligament: Secondary | ICD-10-CM

## 2018-06-10 DIAGNOSIS — R102 Pelvic and perineal pain: Secondary | ICD-10-CM

## 2018-06-10 DIAGNOSIS — R8789 Other abnormal findings in specimens from female genital organs: Secondary | ICD-10-CM

## 2018-06-10 DIAGNOSIS — N839 Noninflammatory disorder of ovary, fallopian tube and broad ligament, unspecified: Secondary | ICD-10-CM | POA: Diagnosis not present

## 2018-06-10 DIAGNOSIS — R87618 Other abnormal cytological findings on specimens from cervix uteri: Secondary | ICD-10-CM

## 2018-06-10 DIAGNOSIS — N951 Menopausal and female climacteric states: Secondary | ICD-10-CM

## 2018-06-10 DIAGNOSIS — N926 Irregular menstruation, unspecified: Secondary | ICD-10-CM

## 2018-06-10 NOTE — Telephone Encounter (Signed)
[facility] is calling in for the pt regarding the MRI--Can  You call the pt and advise, she has not heard anything

## 2018-06-10 NOTE — Telephone Encounter (Signed)
[facility]. Called patient and left vm regarding this appointment. Patient walked into the office and stated her phone wasn't working and I let her know [facility] with [facility] for an [facility] MRI and she should buy otc Ibuprofen and take based on dosing instructions on the bottle with verbal understanding.

## 2018-06-10 NOTE — Patient Instructions (Signed)
Menopause Menopause is the normal time of life when menstrual periods stop completely. Menopause is complete when you have missed 12 consecutive menstrual periods. It usually occurs between the ages of 48 years and 55 years. Very rarely does a woman develop menopause before the age of 40 years. At menopause, your ovaries stop producing the female hormones estrogen and progesterone. This can cause undesirable symptoms and also affect your health. Sometimes the symptoms may occur 4-5 years before the menopause begins. There is no relationship between menopause and:  Oral contraceptives.  Number of children you had.  Race.  The age your menstrual periods started (menarche).  Heavy smokers and very thin women may develop menopause earlier in life. What are the causes?  The ovaries stop producing the female hormones estrogen and progesterone. Other causes include:  Surgery to remove both ovaries.  The ovaries stop functioning for no known reason.  Tumors of the pituitary gland in the brain.  Medical disease that affects the ovaries and hormone production.  Radiation treatment to the abdomen or pelvis.  Chemotherapy that affects the ovaries.  What are the signs or symptoms?  Hot flashes.  Night sweats.  Decrease in sex drive.  Vaginal dryness and thinning of the vagina causing painful intercourse.  Dryness of the skin and developing wrinkles.  Headaches.  Tiredness.  Irritability.  Memory problems.  Weight gain.  Bladder infections.  Hair growth of the face and chest.  Infertility. More serious symptoms include:  Loss of bone (osteoporosis) causing breaks (fractures).  Depression.  Hardening and narrowing of the arteries (atherosclerosis) causing heart attacks and strokes.  How is this diagnosed?  When the menstrual periods have stopped for 12 straight months.  Physical exam.  Hormone studies of the blood. How is this treated? There are many treatment  choices and nearly as many questions about them. The decisions to treat or not to treat menopausal changes is an individual choice made with your health care provider. Your health care provider can discuss the treatments with you. Together, you can decide which treatment will work best for you. Your treatment choices may include:  Hormone therapy (estrogen and progesterone).  Non-hormonal medicines.  Treating the individual symptoms with medicine (for example antidepressants for depression).  Herbal medicines that may help specific symptoms.  Counseling by a psychiatrist or psychologist.  Group therapy.  Lifestyle changes including: ? Eating healthy. ? Regular exercise. ? Limiting caffeine and alcohol. ? Stress management and meditation.  No treatment.  Follow these instructions at home:  Take the medicine your health care provider gives you as directed.  Get plenty of sleep and rest.  Exercise regularly.  Eat a diet that contains calcium (good for the bones) and soy products (acts like estrogen hormone).  Avoid alcoholic beverages.  Do not smoke.  If you have hot flashes, dress in layers.  Take supplements, calcium, and vitamin D to strengthen bones.  You can use over-the-counter lubricants or moisturizers for vaginal dryness.  Group therapy is sometimes very helpful.  Acupuncture may be helpful in some cases. Contact a health care provider if:  You are not sure you are in menopause.  You are having menopausal symptoms and need advice and treatment.  You are still having menstrual periods after age 55 years.  You have pain with intercourse.  Menopause is complete (no menstrual period for 12 months) and you develop vaginal bleeding.  You need a referral to a specialist (gynecologist, psychiatrist, or psychologist) for treatment. Get help right   away if:  You have severe depression.  You have excessive vaginal bleeding.  You fell and think you have a  broken bone.  You have pain when you urinate.  You develop leg or chest pain.  You have a fast pounding heart beat (palpitations).  You have severe headaches.  You develop vision problems.  You feel a lump in your breast.  You have abdominal pain or severe indigestion. This information is not intended to replace advice given to you by your health care provider. Make sure you discuss any questions you have with your health care provider. Document Released: 02/22/2004 Document Revised: 05/09/2016 Document Reviewed: 07/01/2013 Elsevier Interactive Patient Education  2017 Elsevier Inc. Perimenopause Perimenopause is the time when your body begins to move into the menopause (no menstrual period for 12 straight months). It is a natural process. Perimenopause can begin 2-8 years before the menopause and usually lasts for 1 year after the menopause. During this time, your ovaries may or may not produce an egg. The ovaries vary in their production of estrogen and progesterone hormones each month. This can cause irregular menstrual periods, difficulty getting pregnant, vaginal bleeding between periods, and uncomfortable symptoms. What are the causes?  Irregular production of the ovarian hormones, estrogen and progesterone, and not ovulating every month. Other causes include:  Tumor of the pituitary gland in the brain.  Medical disease that affects the ovaries.  Radiation treatment.  Chemotherapy.  Unknown causes.  Heavy smoking and excessive alcohol intake can bring on perimenopause sooner.  What are the signs or symptoms?  Hot flashes.  Night sweats.  Irregular menstrual periods.  Decreased sex drive.  Vaginal dryness.  Headaches.  Mood swings.  Depression.  Memory problems.  Irritability.  Tiredness.  Weight gain.  Trouble getting pregnant.  The beginning of losing bone cells (osteoporosis).  The beginning of hardening of the arteries  (atherosclerosis). How is this diagnosed? Your health care provider will make a diagnosis by analyzing your age, menstrual history, and symptoms. He or she will do a physical exam and note any changes in your body, especially your female organs. Female hormone tests may or may not be helpful depending on the amount of female hormones you produce and when you produce them. However, other hormone tests may be helpful to rule out other problems. How is this treated? In some cases, no treatment is needed. The decision on whether treatment is necessary during the perimenopause should be made by you and your health care provider based on how the symptoms are affecting you and your lifestyle. Various treatments are available, such as:  Treating individual symptoms with a specific medicine for that symptom.  Herbal medicines that can help specific symptoms.  Counseling.  Group therapy.  Follow these instructions at home:  Keep track of your menstrual periods (when they occur, how heavy they are, how long between periods, and how long they last) as well as your symptoms and when they started.  Only take over-the-counter or prescription medicines as directed by your health care provider.  Sleep and rest.  Exercise.  Eat a diet that contains calcium (good for your bones) and soy (acts like the estrogen hormone).  Do not smoke.  Avoid alcoholic beverages.  Take vitamin supplements as recommended by your health care provider. Taking vitamin E may help in certain cases.  Take calcium and vitamin D supplements to help prevent bone loss.  Group therapy is sometimes helpful.  Acupuncture may help in some cases. Contact a   health care provider if:  You have questions about any symptoms you are having.  You need a referral to a specialist (gynecologist, psychiatrist, or psychologist). Get help right away if:  You have vaginal bleeding.  Your period lasts longer than 8 days.  Your periods  are recurring sooner than 21 days.  You have bleeding after intercourse.  You have severe depression.  You have pain when you urinate.  You have severe headaches.  You have vision problems. This information is not intended to replace advice given to you by your health care provider. Make sure you discuss any questions you have with your health care provider. Document Released: 01/09/2005 Document Revised: 05/09/2016 Document Reviewed: 07/01/2013 Elsevier Interactive Patient Education  2017 Elsevier Inc.  

## 2018-06-10 NOTE — Progress Notes (Signed)
Patient ID: Paula Butler, female   DOB: [birth year], 47 y.o.   MRN: [id number] History of Present Illness: Paula Butler is a 47 year old Hispanic female,married,  in complaining of pelvic pain for last 6 months, and pain with sex,she had physical with Dr Tracie HarrierHagler in May and pap came back ASCUS with +HPV and she [facility] that showed normal uterus, nonvisualization right ovary and 2.6 cm hypo attenuated left ovarian mass, and Paula Butler is awaiting a call about open MRI. She has not had period since February, and has hot flashes. She also had labs in March that showed FSH to be 24.3 and LH 23.9,estradiol 28. She recently had surgery left breast for nipple discharge and path was negative. Dr Henreitta LeberBridges performed her surgery.  Her daughter is with her today.  PCP is Dr Tracie HarrierHagler   Current Medications, Allergies, Past Medical History, Past Surgical History, Family History and Social History were [facility].     Review of Systems: +pelvic pain for 6 mos Periods had been regular til December, missed January then had 2 days on February then none since +pain with sex +hot flashes     Physical Exam:BP 119/75 (BP Location: Left Arm, Patient Position: Sitting, Cuff Size: Large)   Pulse 79   Ht 5\' 5"  (1.651 m)   Wt 254 lb 8 oz (115.4 kg)   BMI 42.35 kg/m  General:  Well developed, well nourished, no acute distress Skin:  Warm and dry Neck:  Midline trachea, normal thyroid, good ROM, no lymphadenopathy Lungs; Clear to auscultation bilaterally Cardiovascular: Regular rate and rhythm Pelvic:  External genitalia is normal in appearance, no lesions.  The vagina is normal in appearance. Urethra has no lesions or masses. The cervix is bulbous.  Uterus is felt to be normal size, shape, and contour.  No adnexal masses + tenderness bilaterally, L>R and ?fullness LLQ noted, exam limited by body habitus.Bladder is non tender, no masses felt. Psych:  No mood  changes, alert and cooperative,seems happy PHQ 2 score 0. Discussed pap results with her and that HPV can be dormant then show up years later, and will get colpo scheduled.Called Dr Malena PeerHagler's office about MRI and they will call pt about that. Also discussed perimenopause symptoms, including irregular periods and hot flashes, and sleep disturbance and moodiness.  Face time 30 minutes with 50% counseling and explaining, and coordinating care.   Impression: 1. Pelvic pain   2. Ovarian mass, left   3. Abnormal Papanicolaou smear of cervix with positive human papilloma virus (HPV) test   4. Perimenopause   5. Irregular periods   6. Hot flashes due to menopause       Plan: Return in about 2 weeks for colpo with Dr Emelda FearFerguson Review handouts on pelvic pain by ACOG, abnormal pap and ovarian cysts by Krames and perimenopause and menopause  Depending on MRI results may need CA125.  Call Dr Malena PeerHagler's office in F/U on MRI appt, if not heard in a few days

## 2018-06-10 NOTE — Telephone Encounter (Signed)
Late entry for 06/09/18: called to speak to patient regarding Ibuprofen request. No answer. Will try again later.

## 2018-06-10 NOTE — Telephone Encounter (Signed)
Left message requesting  call back.

## 2018-06-10 NOTE — Telephone Encounter (Signed)
Faxed [facility] mri order and they will contact patient to schedule open mri.

## 2018-06-10 NOTE — Telephone Encounter (Signed)
Patient walked into the office due to her phone not working and information was relayed in person. See previous note.

## 2018-06-16 ENCOUNTER — Encounter: Payer: Self-pay | Admitting: General Surgery

## 2018-06-16 ENCOUNTER — Ambulatory Visit (INDEPENDENT_AMBULATORY_CARE_PROVIDER_SITE_OTHER): Payer: Self-pay | Admitting: General Surgery

## 2018-06-16 VITALS — BP 134/93 | HR 74 | Temp 98.0°F | Resp 18 | Wt 258.0 lb

## 2018-06-16 DIAGNOSIS — N6452 Nipple discharge: Secondary | ICD-10-CM

## 2018-06-16 MED ORDER — OXYCODONE HCL 5 MG PO TABS: 5.0000 mg | ORAL_TABLET | ORAL | 0 refills | Status: DC | PRN

## 2018-06-16 NOTE — Patient Instructions (Addendum)
Activity as tolerated. Mammograms annually through your primary care physician.  Keep the bacitracin on the area as needed and cover as needed.

## 2018-06-16 NOTE — Progress Notes (Signed)
[facility] Note   HPI:  47 y.o. Female presents to clinic for post-op follow-up evaluation after her left nipple exploration with excision of the retro areolar duct system. She is doing well. She did have a "blood blister" come up on the inferior portion of the areola after surgery. This drained and she has been putting bacitracin on the area.  Patient reports no fevers or chills and no further drainage.  Review of Systems:  Pain improving but still sore at night with rest Area of raw tissue at the areolar inferior/ tender  All other review of systems: otherwise negative   Pathology: Diagnosis Breast, excision, left retroareolar duct - DILATED DUCT, SEE COMMENT. - FIBROCYSTIC CHANGE. - NO MALIGNANCY IDENTIFIED. Microscopic Comment There is a dilated duct, but no papillary lesion or fragments. Gross Specimen type: Left breast retroareolar duct excision., time in formalin unknown. Size: 7 x 5 cm, and is up to 3.7 cm thick. Orientation: There is a single long suture at lateral, double short suture at superior, and double long suture identifying distal duct. The double long suture at distal duct also corresponds to anterior. The specimen is inked as follows: green anterior, blue inferior, orange lateral, yellow medial, black posterior, red superior. Localized area: None. Cut surface: There is predominately soft fatty tissue with a focal area of gray white soft rubbery and vaguely nodular fibrous tissue, which also contains a 0.4 cm in diameter duct, which contains soft gray brown material. A discrete lesion or mass is not identified. The area of nodular fibrous tissue is 2.8 x 1.4 x 1.1 cm. Margins: The area of vaguely nodular fibrous tissue with duct abuts portions of anterior and superior margins. Prognostic indicators: N/A  Vital Signs:  BP (!) 134/93 (BP Location: Left Arm, Patient Position: Sitting, Cuff Size: Large)   Pulse 74   Temp 98 F (36.7 C) (Oral)   Resp 18    Wt 258 lb (117 kg)   BMI 42.93 kg/m    Physical Exam:  Physical Exam  Constitutional: She appears well-developed.  HENT:  Head: Normocephalic.  Eyes: Pupils are equal, round, and reactive to light.  Cardiovascular: Normal rate.  Pulmonary/Chest: Effort normal.  Left breast with bruising superior, inferior areola with de-epithelized area with some exposed dermis, incision healing with some areas of dark dermabond, no erythema or drainage extending from the incision  Vitals reviewed.   Laboratory studies: None    Imaging:  None    Assessment:  47 y.o. yo Female s/p left retroareola nipple duct system excision. Doing well but had some sloughing of the epidermal tissue of the skin on the areola.   Plan:  - Plan to see back in 2 weeks to see and check the wound   - Refill of the Roxicodone sent in for pain at night  Activity as tolerated. Mammograms annually through your primary care physician.  Keep the bacitracin on the area as needed and cover as needed.   All of the above recommendations were discussed with the patient, and all of patient's questions were answered to her expressed satisfaction.  Algis GreenhouseLindsay Pamella Samons, MD [facility] [address] [place], [place] [phone] (office)

## 2018-06-22 ENCOUNTER — Encounter: Payer: Self-pay | Admitting: Obstetrics and Gynecology

## 2018-06-22 ENCOUNTER — Ambulatory Visit (INDEPENDENT_AMBULATORY_CARE_PROVIDER_SITE_OTHER): Payer: Medicare Other | Admitting: Obstetrics and Gynecology

## 2018-06-22 ENCOUNTER — Other Ambulatory Visit: Payer: Self-pay | Admitting: Obstetrics and Gynecology

## 2018-06-22 VITALS — BP 106/71 | HR 78 | Ht 65.0 in | Wt 254.2 lb

## 2018-06-22 DIAGNOSIS — R87810 Cervical high risk human papillomavirus (HPV) DNA test positive: Secondary | ICD-10-CM | POA: Diagnosis not present

## 2018-06-22 DIAGNOSIS — Z3202 Encounter for pregnancy test, result negative: Secondary | ICD-10-CM | POA: Diagnosis not present

## 2018-06-22 DIAGNOSIS — N879 Dysplasia of cervix uteri, unspecified: Secondary | ICD-10-CM | POA: Diagnosis not present

## 2018-06-22 DIAGNOSIS — R87610 Atypical squamous cells of undetermined significance on cytologic smear of cervix (ASC-US): Secondary | ICD-10-CM

## 2018-06-22 LAB — POCT URINE PREGNANCY: Preg Test, Ur: NEGATIVE

## 2018-06-22 NOTE — Addendum Note (Signed)
Addended by: Colen DarlingYOUNG, Carly Applegate S on: 06/22/2018 04:12 PM   Modules accepted: Orders

## 2018-06-22 NOTE — Progress Notes (Signed)
Patient ID: Paula FoersterAlicia A Butler, female   DOB: [birth year], 47 y.o.   MRN: [id number]   Paula Butler 47 y.o. [id number] here for colposcopy for atypical squamous cellularity of undetermined significance (ASCUS) pap smear on 05/06/18.  Discussed role for HPV in cervical dysplasia, need for surveillance.  Patient given informed consent, signed copy in the chart, time out was performed.  Placed in lithotomy position. Cervix viewed with speculum and colposcope after application of acetic acid.   Colposcopy adequate? Yes No visible dysplasia; no biopsies obtained.  ECC specimen obtained. specimens were labelled and sent to pathology.   Colposcopy IMPRESSION: no visible dysplasia.  Patient was given post procedure instructions. Will follow up pathology and manage accordingly.  Routine preventative health maintenance measures emphasized.   By signing my name below, I, Arnette NorrisMari Johnson, attest that this documentation has been prepared under the direction and in the presence of Tilda BurrowFerguson, Trenda Corliss V, MD. Electronically Signed: Arnette NorrisMari Johnson Medical Scribe. 06/22/18. 1:44 PM.  I personally performed the services described in this documentation, which was SCRIBED in my presence. The recorded information has been reviewed and considered accurate. It has been edited as necessary during review. Tilda BurrowJohn V Channie Bostick, MD

## 2018-06-23 ENCOUNTER — Ambulatory Visit: Payer: Self-pay | Admitting: General Surgery

## 2018-06-30 ENCOUNTER — Ambulatory Visit (INDEPENDENT_AMBULATORY_CARE_PROVIDER_SITE_OTHER): Payer: Self-pay | Admitting: General Surgery

## 2018-06-30 VITALS — BP 135/83 | HR 78 | Temp 98.7°F | Resp 18 | Wt 257.0 lb

## 2018-06-30 DIAGNOSIS — N6452 Nipple discharge: Secondary | ICD-10-CM

## 2018-06-30 NOTE — Progress Notes (Signed)
[facility] Note   HPI:  47 y.o. Female presents to clinic for post-op follow-up evaluation of her wound on the left breast s/p retroareola excision. She is doing well and has no complaints. She does report some right sided nipple discharge now. She says this is brown and sticky in nature. She had bilateral MRI of the breast that demonstrated nothing on the right or the left breast. The discharge has happened about 4 times.   Review of Systems:  No fevers or chills Some soreness  All other review of systems: otherwise negative   Vital Signs:  BP 135/83 (BP Location: Left Arm, Patient Position: Sitting, Cuff Size: Normal)   Pulse 78   Temp 98.7 F (37.1 C)   Resp 18   Wt 257 lb (116.6 kg)   BMI 42.77 kg/m    Physical Exam:  Physical Exam  Constitutional: She appears well-developed.  Cardiovascular: Normal rate.  Pulmonary/Chest: Effort normal.  Left inferior nipple/ areola healing, minimal loss of the colored portion of the areola, incision healed, no erythema or drainage, somewhat tender  Skin: Skin is warm and dry.  Vitals reviewed.   Laboratory studies: None   Imaging:  None    Assessment:  47 y.o. yo Female s/p left breast retroareola excision that demonstrated no concerning pathology. Her wound is healing. She is having some right nipple discharge but this is brown and sticky per report. MRI from prior demonstrated no lesions.  Given the benign disease and lack of pathology on the left, I do not think we need to rush into surgery on the right breast.    Plan:  - Will send Dr. Adela GlimpseHaglar a note regarding right nipple discharge, given patient's concern and history may need to just repeat her US and mammogram in 6 months for the right breast discharge and see if this spontaneous drainage stops.    Recent research has demonstrated that patients with negative US/ mammogram, MRI can be followed for a 2 year period to see if the discharge spontaneously resolves,  which occurs 81% of the time. Gunnar Fusi(Paula and [place]ampos. Breast Imaging in patients with nipple discharge. [facility]iol[facility] [place]. 2017 Nov-Dec; 50(6): 383-388;  Ashfaq et al. Bernell ListValidaition study of a modern treatment algorithm for nipple discharge. Am J Surg. 2014; 208: 222-227).  Women can be offered clinical exam and repeat US every 6 months for 2 years with annual mammograms, and if the discharge spontaneously stops they avoid any surgery.    Follow up with me PRN   All of the above recommendations were discussed with the patient, and all of patient's questions were answered to her expressed satisfaction.  Algis GreenhouseLindsay Cosandra Plouffe, MD [facility] [address] [place], [place] [id number] [phone] (office)

## 2018-07-07 ENCOUNTER — Ambulatory Visit: Payer: Medicare Other | Admitting: Family Medicine

## 2018-07-08 ENCOUNTER — Encounter: Payer: Medicare Other | Attending: Family Medicine | Admitting: Nutrition

## 2018-07-08 ENCOUNTER — Telehealth: Payer: Self-pay | Admitting: Obstetrics & Gynecology

## 2018-07-08 VITALS — Ht 65.0 in | Wt 256.6 lb

## 2018-07-08 DIAGNOSIS — Z713 Dietary counseling and surveillance: Secondary | ICD-10-CM | POA: Diagnosis not present

## 2018-07-08 DIAGNOSIS — Z6841 Body Mass Index (BMI) 40.0 and over, adult: Secondary | ICD-10-CM | POA: Diagnosis not present

## 2018-07-08 DIAGNOSIS — R739 Hyperglycemia, unspecified: Secondary | ICD-10-CM

## 2018-07-08 DIAGNOSIS — E119 Type 2 diabetes mellitus without complications: Secondary | ICD-10-CM | POA: Insufficient documentation

## 2018-07-08 DIAGNOSIS — E669 Obesity, unspecified: Secondary | ICD-10-CM

## 2018-07-08 NOTE — Progress Notes (Signed)
Diabetes Self-Management Education  Visit Type:  f/u   Appt. Start Time: 1530 Appt. End Time: 1600 07/08/2018  Ms. Paula NovasAlicia Butler, identified by name and date of birth, is a 47 y.o. female with a diagnosis of Diabetes:    Has been cut out sodas, sweets, desserts, red meat, pasta or rice.  Drinking more water Walk 60-90 minutes 3-4 times per week. a1C down to 5.7%. MEtformin 500 mg BID ASSESSMENT Lab Results  Component Value Date   HGBA1C 5.7 (H) 05/25/2018     B) Coffee with whole wheat toast-1slice L) chicken salad with tomatoes, cukes, spinach and onions, water D) Lettuce, cukes, tomatoes, and 2 bottles of water Walked 60-90 minutes.    Individualized Plan for Diabetes Self-Management Training:   Learning Objective:  Patient will have a greater understanding of diabetes self-management. Patient education plan is to attend individual and/or group sessions per assessed needs and concerns.   Plan: Goals 1. Lose 1 lb per week 2. Eat 2 carb choices per meal 3. Ask about seeing DR. Nida for thyroid 4. Keep exercising 60-90 minutes 4-5 times per week.   Expected Outcomes:   Improved blood sugar control, reduced complications and better self management.  Education material provided: Living Well with Diabetes, Meal plan card, My Plate and Carbohydrate counting sheet  If problems or questions, patient to contact team via:  Phone and Email  Future DSME appointment:   f/u 1-3 month.

## 2018-07-08 NOTE — Patient Instructions (Signed)
Goals 1. Lose 1 lb per week 2. Eat 2 carb choices per meal 3. Ask about seeing DR. Nida for thyroid 4. Keep exercising 60-90 minutes 4-5 times per week.

## 2018-07-08 NOTE — Telephone Encounter (Signed)
[facility] pathology showed negative endocervical curettage. Patient will simply need to have Pap with cotesting in 1 year.

## 2018-07-10 DIAGNOSIS — H52203 Unspecified astigmatism, bilateral: Secondary | ICD-10-CM | POA: Diagnosis not present

## 2018-07-10 DIAGNOSIS — H524 Presbyopia: Secondary | ICD-10-CM | POA: Diagnosis not present

## 2018-07-10 DIAGNOSIS — H5213 Myopia, bilateral: Secondary | ICD-10-CM | POA: Diagnosis not present

## 2018-07-10 DIAGNOSIS — E119 Type 2 diabetes mellitus without complications: Secondary | ICD-10-CM | POA: Diagnosis not present

## 2018-07-10 LAB — HM DIABETES EYE EXAM

## 2018-07-14 ENCOUNTER — Telehealth: Payer: Self-pay | Admitting: *Deleted

## 2018-07-14 NOTE — Telephone Encounter (Signed)
Patient notified via Paula DaltonDaisy Butler of lab results and need for repeat pap in 1 year.  Verbalized understanding.

## 2018-07-15 ENCOUNTER — Encounter: Payer: Self-pay | Admitting: Nutrition

## 2018-08-04 ENCOUNTER — Ambulatory Visit: Payer: Medicare Other | Admitting: Family Medicine

## 2018-08-13 ENCOUNTER — Ambulatory Visit: Payer: Medicare Other | Admitting: Nutrition

## 2018-10-03 DIAGNOSIS — E119 Type 2 diabetes mellitus without complications: Secondary | ICD-10-CM | POA: Diagnosis not present

## 2018-10-03 DIAGNOSIS — M545 Low back pain: Secondary | ICD-10-CM | POA: Diagnosis not present

## 2018-10-03 DIAGNOSIS — S3993XA Unspecified injury of pelvis, initial encounter: Secondary | ICD-10-CM | POA: Diagnosis not present

## 2018-10-03 DIAGNOSIS — S3992XA Unspecified injury of lower back, initial encounter: Secondary | ICD-10-CM | POA: Diagnosis not present

## 2018-10-03 DIAGNOSIS — Z7984 Long term (current) use of oral hypoglycemic drugs: Secondary | ICD-10-CM | POA: Diagnosis not present

## 2018-10-03 DIAGNOSIS — W1830XA Fall on same level, unspecified, initial encounter: Secondary | ICD-10-CM | POA: Diagnosis not present

## 2018-10-03 DIAGNOSIS — M25551 Pain in right hip: Secondary | ICD-10-CM | POA: Diagnosis not present

## 2018-10-03 DIAGNOSIS — M25552 Pain in left hip: Secondary | ICD-10-CM | POA: Diagnosis not present

## 2018-10-03 DIAGNOSIS — R42 Dizziness and giddiness: Secondary | ICD-10-CM | POA: Diagnosis not present

## 2019-01-19 DIAGNOSIS — E119 Type 2 diabetes mellitus without complications: Secondary | ICD-10-CM | POA: Diagnosis not present

## 2019-01-19 DIAGNOSIS — N63 Unspecified lump in unspecified breast: Secondary | ICD-10-CM | POA: Diagnosis not present

## 2019-01-19 DIAGNOSIS — I10 Essential (primary) hypertension: Secondary | ICD-10-CM | POA: Diagnosis not present

## 2019-01-19 DIAGNOSIS — E039 Hypothyroidism, unspecified: Secondary | ICD-10-CM | POA: Diagnosis not present

## 2019-01-19 DIAGNOSIS — E785 Hyperlipidemia, unspecified: Secondary | ICD-10-CM | POA: Diagnosis not present

## 2019-01-19 LAB — TSH: TSH: 3.81 (ref 0.41–5.90)

## 2019-01-19 LAB — LIPID PANEL
Cholesterol: 220 — AB (ref 0–200)
HDL: 40 (ref 35–70)
LDL Cholesterol: 154
Triglycerides: 137 (ref 40–160)

## 2019-01-19 LAB — HEMOGLOBIN A1C: Hemoglobin A1C: 5.8

## 2019-01-19 LAB — BASIC METABOLIC PANEL
BUN: 16 (ref 4–21)
Creatinine: 0.9 (ref 0.5–1.1)

## 2019-02-18 ENCOUNTER — Ambulatory Visit (INDEPENDENT_AMBULATORY_CARE_PROVIDER_SITE_OTHER): Payer: Medicare Other | Admitting: General Surgery

## 2019-02-18 ENCOUNTER — Encounter (INDEPENDENT_AMBULATORY_CARE_PROVIDER_SITE_OTHER): Payer: Self-pay

## 2019-02-18 ENCOUNTER — Encounter: Payer: Self-pay | Admitting: General Surgery

## 2019-02-18 VITALS — BP 124/96 | HR 90 | Temp 98.2°F | Resp 18 | Wt 268.4 lb

## 2019-02-18 DIAGNOSIS — L7682 Other postprocedural complications of skin and subcutaneous tissue: Secondary | ICD-10-CM | POA: Insufficient documentation

## 2019-02-18 DIAGNOSIS — N644 Mastodynia: Secondary | ICD-10-CM | POA: Diagnosis not present

## 2019-02-18 MED ORDER — GABAPENTIN 100 MG PO CAPS: 100.0000 mg | ORAL_CAPSULE | Freq: Three times a day (TID) | ORAL | 1 refills | Status: DC

## 2019-02-18 NOTE — Patient Instructions (Signed)
Take gabapentin 100 mg three times a day for 1 week. If the pain in the breast is not improving, increase it to 200 mg three times a day. Get your mammogram scheduled for 2 weeks after you start the medication.  Will plan to see you in about 1 month.

## 2019-02-18 NOTE — Progress Notes (Signed)
[facility] Note   HPI:  48 y.o. Female presents to clinic for  follow-up evaluation. She had a left breast s/p retroareola excision in June 2019 and was doing well. Her pathology was negative. She said that started in November of 2019 she started having random sharp pains in her breast and burning/ sensitivity of the nipple area.  This is not cyclic and it is only in the area of the incision/ procedure. She has had no drainage or redness.   Review of Systems:  Sensitivity and burning of the nipple area with sharp pains intermittently  All other review of systems: otherwise negative   Vital Signs:  BP (!) 124/96 (BP Location: Left Arm, Patient Position: Sitting, Cuff Size: Large)   Pulse 90   Temp 98.2 F (36.8 C) (Temporal)   Resp 18   Wt 268 lb 6.4 oz (121.7 kg)   BMI 44.66 kg/m    Physical Exam:  Physical Exam Vitals signs reviewed.  Constitutional:      Appearance: Normal appearance.  HENT:     Head: Normocephalic.     Nose: Nose normal.     Mouth/Throat:     Mouth: Mucous membranes are moist.  Cardiovascular:     Rate and Rhythm: Normal rate.  Pulmonary:     Effort: Pulmonary effort is normal.  Chest:     Comments: Left breast incision healed, minimal induration under the nipple, no retraction of the nipple, no erythema or drainage, no masses noted, no nipple drainage Abdominal:     General: There is no distension.     Palpations: Abdomen is soft.     Tenderness: There is no abdominal tenderness.  Musculoskeletal: Normal range of motion.        General: No swelling.  Skin:    General: Skin is warm and dry.  Neurological:     General: No focal deficit present.     Mental Status: She is alert and oriented to person, place, and time.  Psychiatric:        Mood and Affect: Mood normal.        Behavior: Behavior normal.        Thought Content: Thought content normal.        Judgment: Judgment normal.      Assessment:  48 y.o. yo Female with  some post operative pain that is some what delayed but does sound neuropathic in nature with the sensitivity and the burning. No obvious signs of any infection or concerning features. Hopefully starting some gabapentin will help with pain and we can slowly wean it off. She still needs a mammogram this year to rule out any thing concerning.   Plan:  Take gabapentin 100 mg three times a day for 1 week. If the pain in the breast is not improving, increase it to 200 mg three times a day. Get your mammogram scheduled for 2 weeks after you start the medication.  Will plan to see you in about 1 month.   All of the above recommendations were discussed with the patient, and all of patient's questions were answered to her expressed satisfaction.  Algis Greenhouse, MD [facility] [address] [place], [place] [address] [phone] (office)

## 2019-03-15 ENCOUNTER — Telehealth: Payer: Self-pay | Admitting: General Surgery

## 2019-03-16 NOTE — Telephone Encounter (Signed)
[facility]  Called patient yesterday and today. Left voicemails. Also called husband's number but it went straight to voicemail.  On patient's number asked her to call the office.   Given COVID 19, we are trying to prevent patients from having to come into the office. I have asked the patient to call the office to let us know how she is doing.  Algis Greenhouse, MD [facility] [address] [place], [place] [address] [phone] (office)

## 2019-03-18 ENCOUNTER — Ambulatory Visit: Payer: Medicare Other | Admitting: General Surgery

## 2019-03-31 ENCOUNTER — Ambulatory Visit: Payer: Medicare Other | Admitting: "Endocrinology

## 2019-04-21 DIAGNOSIS — I10 Essential (primary) hypertension: Secondary | ICD-10-CM | POA: Diagnosis not present

## 2019-04-21 DIAGNOSIS — E1165 Type 2 diabetes mellitus with hyperglycemia: Secondary | ICD-10-CM | POA: Diagnosis not present

## 2019-04-21 DIAGNOSIS — E785 Hyperlipidemia, unspecified: Secondary | ICD-10-CM | POA: Diagnosis not present

## 2019-04-22 ENCOUNTER — Other Ambulatory Visit (HOSPITAL_COMMUNITY): Payer: Self-pay | Admitting: Internal Medicine

## 2019-04-22 DIAGNOSIS — Z1231 Encounter for screening mammogram for malignant neoplasm of breast: Secondary | ICD-10-CM

## 2019-04-26 ENCOUNTER — Other Ambulatory Visit: Payer: Self-pay

## 2019-04-26 ENCOUNTER — Encounter: Payer: Self-pay | Admitting: "Endocrinology

## 2019-04-26 ENCOUNTER — Ambulatory Visit (INDEPENDENT_AMBULATORY_CARE_PROVIDER_SITE_OTHER): Payer: Medicare Other | Admitting: "Endocrinology

## 2019-04-26 DIAGNOSIS — E1169 Type 2 diabetes mellitus with other specified complication: Secondary | ICD-10-CM

## 2019-04-26 MED ORDER — METFORMIN HCL ER 500 MG PO TB24: 500.0000 mg | ORAL_TABLET | Freq: Every day | ORAL | 3 refills | Status: DC

## 2019-04-26 NOTE — Progress Notes (Signed)
Endocrinology Consult Note       04/26/2019, 4:56 PM   Subjective:    Patient ID: Paula Butler, female    DOB: [birth year].  Paula Butler is being seen in consultation for management of currently uncontrolled symptomatic diabetes requested by  Avon Gully, MD.   Past Medical History:  Diagnosis Date  . Diabetes mellitus without complication (HCC)   . Hyperlipidemia   . Hypertension     Past Surgical History:  Procedure Laterality Date  . CHOLECYSTECTOMY    . EXCISION OF BREAST BIOPSY Left 05/29/2018   Procedure: NIPPLE EXPLORATION/EXCISION RETROAREOLA TISSUE LEFT BREAST;  Surgeon: Lucretia Roers, MD;  Location: [facility];  Service: General;  Laterality: Left;  . TUBAL LIGATION      Social History   Socioeconomic History  . Marital status: Married    Spouse name: Not on file  . Number of children: Not on file  . Years of education: Not on file  . Highest education level: Not on file  Occupational History  . Not on file  Social Needs  . Financial resource strain: Not on file  . Food insecurity:    Worry: Not on file    Inability: Not on file  . Transportation needs:    Medical: Not on file    Non-medical: Not on file  Tobacco Use  . Smoking status: Never Smoker  . Smokeless tobacco: Never Used  Substance and Sexual Activity  . Alcohol use: No  . Drug use: No  . Sexual activity: Not Currently    Partners: Male    Birth control/protection: Surgical    Comment: tubal  Lifestyle  . Physical activity:    Days per week: Not on file    Minutes per session: Not on file  . Stress: Not on file  Relationships  . Social connections:    Talks on phone: More than three times a week    Gets together: More than three times a week    Attends religious service: More than 4 times per year    Active member of club or organization: Yes    Attends meetings of clubs or organizations:  More than 4 times per year    Relationship status: Married  Other Topics Concern  . Not on file  Social History Narrative   Married for 10 years, 3 children, all grown/teenagers.    2 daughters, 1 son.   Attends church.    Walks for exercise.   Enjoys singing.   Eats all food groups.   Wears seatbelt.   Born in [place], grew up in [place]. Father in [employer]. Grandmother raised her.    Moved from [place] in 2018.    Does not work out side of home.     Family History  Problem Relation Age of Onset  . Diabetes Mother   . Hypothyroidism Mother   . Prostate cancer Father   . Breast cancer Sister   . Stroke Brother     Outpatient Encounter Medications as of 04/26/2019  Medication Sig  . atorvastatin (LIPITOR) 10 MG tablet Take 10 mg by mouth daily.  Marland Kitchen  lisinopril (ZESTRIL) 2.5 MG tablet Take 2.5 mg by mouth daily.  . metFORMIN (GLUCOPHAGE XR) 500 MG 24 hr tablet Take 1 tablet (500 mg total) by mouth daily with breakfast.  . [DISCONTINUED] gabapentin (NEURONTIN) 100 MG capsule Take 1 capsule (100 mg total) by mouth 3 (three) times daily.  . [DISCONTINUED] lisinopril (ZESTRIL) 2.5 MG tablet Take 1 tablet (2.5 mg total) by mouth daily. (Patient taking differently: Take 2.5 mg by mouth every evening. )  . [DISCONTINUED] metFORMIN (GLUCOPHAGE) 500 MG tablet Take 1 tablet (500 mg total) by mouth 2 (two) times daily with a meal.   No facility-administered encounter medications on file as of 04/26/2019.     ALLERGIES: Allergies  Allergen Reactions  . Fish Allergy Anaphylaxis, Shortness Of Breath and Rash  . Shrimp [Shellfish Allergy] Anaphylaxis, Shortness Of Breath and Rash  . Asa [Aspirin] Other (See Comments)    Abdominal swelling   . Penicillins Hives, Itching and Rash    Has patient had a PCN reaction causing immediate rash, facial/tongue/throat swelling, SOB or lightheadedness with hypotension: Yes Has patient had a PCN reaction causing severe rash involving mucus membranes or skin  necrosis: No Has patient had a PCN reaction that required hospitalization: Yes Has patient had a PCN reaction occurring within the last 10 years: Yes If all of the above answers are "NO", then may proceed with Cephalosporin use.     VACCINATION STATUS: Immunization History  Administered Date(s) Administered  . Influenza,inj,Quad PF,6+ Mos 10/01/2018  . Pneumococcal Conjugate-13 02/27/2018  . Tdap 02/27/2018    Diabetes  She presents for her initial diabetic visit. She has type 2 diabetes mellitus. Her disease course has been stable. There are no hypoglycemic associated symptoms. Pertinent negatives for hypoglycemia include no confusion, headaches, pallor or seizures. There are no diabetic associated symptoms. Pertinent negatives for diabetes include no chest pain, no polydipsia, no polyphagia and no polyuria. There are no hypoglycemic complications. Symptoms are stable. There are no diabetic complications. Risk factors for coronary artery disease include diabetes mellitus, hypertension, obesity and sedentary lifestyle. Her weight is increasing steadily. She is following a generally unhealthy diet. When asked about meal planning, she reported none. She has not had a previous visit with a dietitian. She rarely participates in exercise. There is no change in her home blood glucose trend. An ACE inhibitor/angiotensin II receptor blocker is being taken.  Hypertension  This is a chronic problem. The problem is controlled. Pertinent negatives include no chest pain, headaches, palpitations or shortness of breath. Risk factors for coronary artery disease include family history, obesity and sedentary lifestyle. Past treatments include ACE inhibitors.  Hyperlipidemia  This is a chronic problem. The current episode started more than 1 year ago. The problem is controlled. Exacerbating diseases include diabetes. Pertinent negatives include no chest pain, myalgias or shortness of breath. Current  antihyperlipidemic treatment includes statins. Risk factors for coronary artery disease include a sedentary lifestyle, dyslipidemia, diabetes mellitus, family history, hypertension and obesity.     Review of Systems  Constitutional: Negative for chills, fever and unexpected weight change.  HENT: Negative for trouble swallowing and voice change.   Eyes: Negative for visual disturbance.  Respiratory: Negative for cough, shortness of breath and wheezing.   Cardiovascular: Negative for chest pain, palpitations and leg swelling.  Gastrointestinal: Negative for diarrhea, nausea and vomiting.  Endocrine: Negative for cold intolerance, heat intolerance, polydipsia, polyphagia and polyuria.  Musculoskeletal: Negative for arthralgias and myalgias.  Skin: Negative for color change, pallor, rash and  wound.  Neurological: Negative for seizures and headaches.  Psychiatric/Behavioral: Negative for confusion and suicidal ideas.    Objective:    BP 126/82   Pulse (!) 101   Ht 5\' 5"  (1.651 m)   Wt 270 lb (122.5 kg)   BMI 44.93 kg/m   Wt Readings from Last 3 Encounters:  04/26/19 270 lb (122.5 kg)  02/18/19 268 lb 6.4 oz (121.7 kg)  07/08/18 256 lb 9.6 oz (116.4 kg)     Physical Exam Constitutional:      Appearance: She is well-developed.  HENT:     Head: Normocephalic and atraumatic.  Neck:     Musculoskeletal: Normal range of motion and neck supple.     Thyroid: No thyromegaly.     Trachea: No tracheal deviation.  Cardiovascular:     Rate and Rhythm: Normal rate and regular rhythm.  Pulmonary:     Effort: Pulmonary effort is normal.  Abdominal:     Tenderness: There is no abdominal tenderness. There is no guarding.  Musculoskeletal: Normal range of motion.  Skin:    General: Skin is warm and dry.     Coloration: Skin is not pale.     Findings: No erythema or rash.  Neurological:     Mental Status: She is alert and oriented to person, place, and time.     Cranial Nerves: No  cranial nerve deficit.     Coordination: Coordination normal.     Deep Tendon Reflexes: Reflexes are normal and symmetric.  Psychiatric:        Mood and Affect: Mood normal.        Judgment: Judgment normal.       CMP ( most recent) CMP     Component Value Date/Time   NA 142 05/25/2018 0814   K 3.9 05/25/2018 0814   CL 108 05/25/2018 0814   CO2 26 05/25/2018 0814   GLUCOSE 106 (H) 05/25/2018 0814   BUN 16 01/19/2019   CREATININE 0.9 01/19/2019   CREATININE 0.76 05/25/2018 0814   CREATININE 0.97 02/27/2018 1219   CALCIUM 9.3 05/25/2018 0814   PROT 6.8 03/24/2018 1240   ALBUMIN 3.5 07/21/2017 1416   AST 17 03/24/2018 1240   ALT 13 03/24/2018 1240   ALKPHOS 69 07/21/2017 1416   BILITOT 0.5 03/24/2018 1240   GFRNONAA >60 05/25/2018 0814   GFRNONAA 71 02/27/2018 1219   GFRAA >60 05/25/2018 0814   GFRAA 82 02/27/2018 1219     Diabetic Labs (most recent): Lab Results  Component Value Date   HGBA1C 5.8 01/19/2019   HGBA1C 5.7 (H) 05/25/2018   HGBA1C 5.9 (H) 02/27/2018     Lipid Panel ( most recent) Lipid Panel     Component Value Date/Time   CHOL 220 (A) 01/19/2019   TRIG 137 01/19/2019   HDL 40 01/19/2019   CHOLHDL 5.6 (H) 03/24/2018 1240   LDLCALC 154 01/19/2019   LDLCALC 126 (H) 03/24/2018 1240      Lab Results  Component Value Date   TSH 3.81 01/19/2019   TSH 3.48 02/27/2018           Assessment & Plan:   1. Type 2 diabetes mellitus without  specified complication, without long-term current use of insulin (HCC) - BREKKEN FANG has currently controlled asymptomatic type 2 DM since  48 years of age,  with most recent A1c of 5.8 %. Recent labs reviewed. - I had a long discussion with her about the progressive nature of diabetes and the pathology  behind its complications. -her diabetes is complicated by obesity/sedentary life and she remains at a high risk for more acute and chronic complications which include CAD, CVA, CKD, retinopathy, and  neuropathy. These are all discussed in detail with her.  - I have counseled her on diet management and weight loss, by adopting a carbohydrate restricted/protein rich diet. - she admits that there is a room for improvement in her food and drink choices. - Suggestion is made for her to avoid simple carbohydrates  from her diet including Cakes, Sweet Desserts, Ice Cream, Soda (diet and regular), Sweet Tea, Candies, Chips, Cookies, Store Bought Juices, Alcohol in Excess of  1-2 drinks a day, Artificial Sweeteners,  Coffee Creamer, and "Sugar-free" Products. This will help patient to have more stable blood glucose profile and potentially avoid unintended weight gain.  - I encouraged her to switch to  unprocessed or minimally processed complex starch and increased protein intake (animal or plant source), fruits, and vegetables.  - she is advised to stick to a routine mealtimes to eat 3 meals  a day and avoid unnecessary snacks ( to snack only to correct hypoglycemia).   - she will be scheduled with Norm Salt, RDN, CDE for individualized diabetes education.  - I have approached her with the following individualized plan to manage diabetes and patient agrees:   -Based on her recent A1c measurements are below 6%, she will not require further escalation of treatment.  In fact, she would benefit from simplification of treatment.  I advised her to lower her metformin to 500 mg ER once a day after breakfast.  She will not need fingerstick monitoring at this time.  - she will be considered for incretin therapy as appropriate next visit.  - Patient specific target  A1c;  LDL, HDL, Triglycerides, and  Waist Circumference were discussed in detail.  2) Blood Pressure /Hypertension:  her blood pressure is  controlled to target.   she is advised to continue her current medications including lisinopril 2.5 mg p.o. daily with breakfast . 3) Lipids/Hyperlipidemia:   Review of her recent lipid panel showed  uncontrolled  LDL at 155 .  She is advised to continue Lipitor 10 mg p.o. nightly.     Side effects and precautions discussed with her.  4)  Weight/Diet:  Body mass index is 44.93 kg/m.  -   clearly complicating her diabetes care.  I discussed with her the fact that loss of 5 - 10% of her  current body weight will have the most impact on her diabetes management.  CDE Consult will be initiated . Exercise, and detailed carbohydrates information provided  -  detailed on discharge instructions.  She will be considered for bariatric surgery on subsequent visits.  5) Chronic Care/Health Maintenance:  -she  is on ACEI/ARB and Statin medications and  is encouraged to initiate and continue to follow up with Ophthalmology, Dentist,  Podiatrist at least yearly or according to recommendations, and advised to  stay away from smoking. I have recommended yearly flu vaccine and pneumonia vaccine at least every 5 years; moderate intensity exercise for up to 150 minutes weekly; and  sleep for at least 7 hours a day.  - she is  advised to maintain close follow up with Avon Gully, MD for primary care needs, as well as her other providers for optimal and coordinated care.  - Time spent with the patient: 45 minutes, of which >50% was spent in obtaining information about her symptoms, reviewing her previous  labs/studies, evaluations, and treatments, counseling her about her controlled type 2 diabetes, hyperlipidemia, hypertension, and developing plans for long term treatment based on the latest standards of care/guidelines.  Please refer to " Patient Self Inventory" in the Media  tab for reviewed elements of pertinent patient history.  Paula FoersterAlicia A Butler participated in the discussions, expressed understanding, and voiced agreement with the above plans.  All questions were answered to her satisfaction. she is encouraged to contact clinic should she have any questions or concerns prior to her return visit.  Follow up  plan: - Return in about 6 months (around 10/27/2019) for Follow up with Pre-visit Labs.  Marquis LunchGebre Skyelar Halliday, MD [facility] [address] [place], [place] [address] Phone: [phone]  Fax: [phone]    04/26/2019, 4:56 PM  This note was partially dictated with voice recognition software. Similar sounding words can be transcribed inadequately or may not  be corrected upon review.

## 2019-04-26 NOTE — Patient Instructions (Signed)

## 2019-04-28 IMAGING — US US PELVIS COMPLETE
1 series · 14 of 25 positions shown · non-contrast
Comparison: Body CT 12/20/2017

CLINICAL DATA: Left-sided adnexal pain.

EXAM:
TRANSABDOMINAL ULTRASOUND OF PELVIS
TECHNIQUE: Transabdominal ultrasound examination of the pelvis was performed
including evaluation of the uterus, ovaries, adnexal regions, and
pelvic cul-de-sac.

[Series 1: us pelvis complete · 0.26mm/px · 14 of 44 slices shown]
[im 1/44]
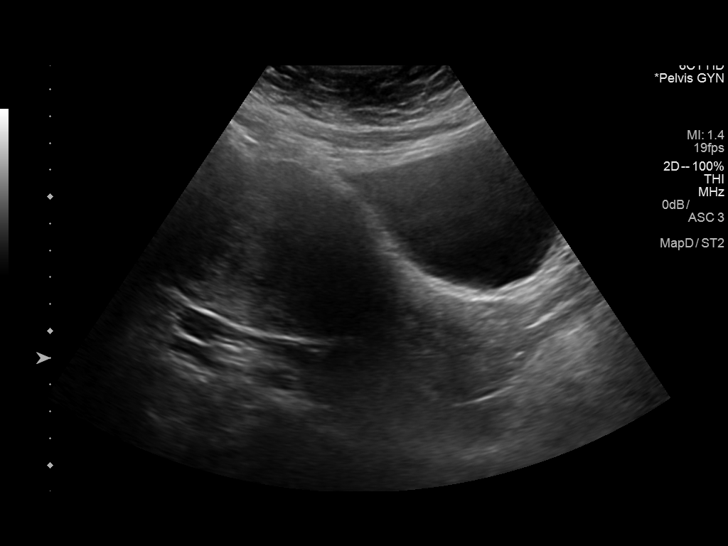
[im 4/44]
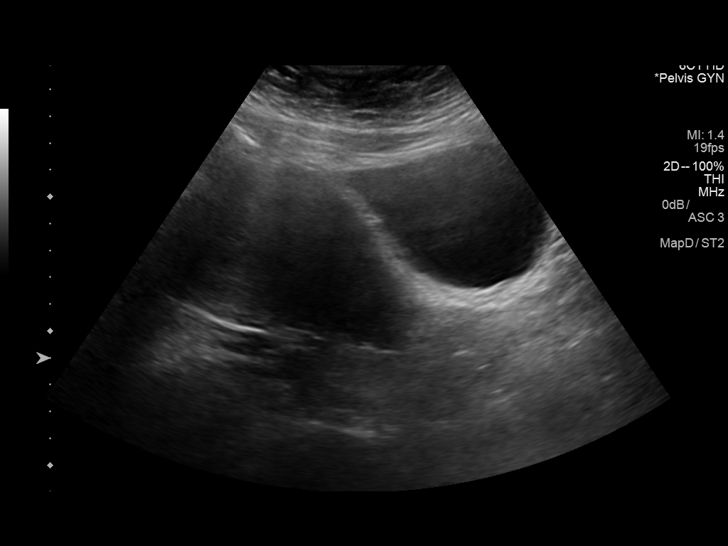
[im 8/44]
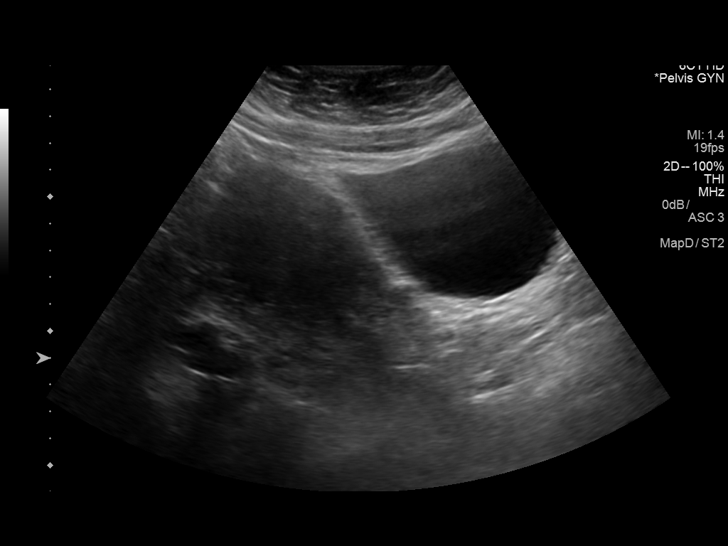
[im 11/44]
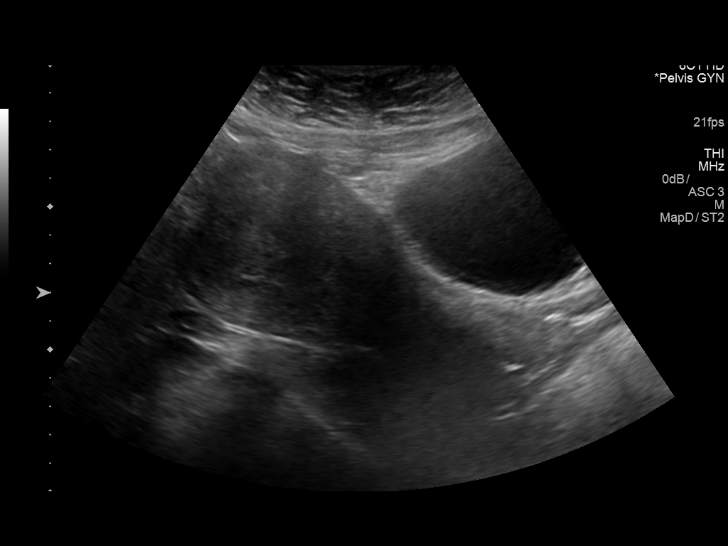
[im 15/44]
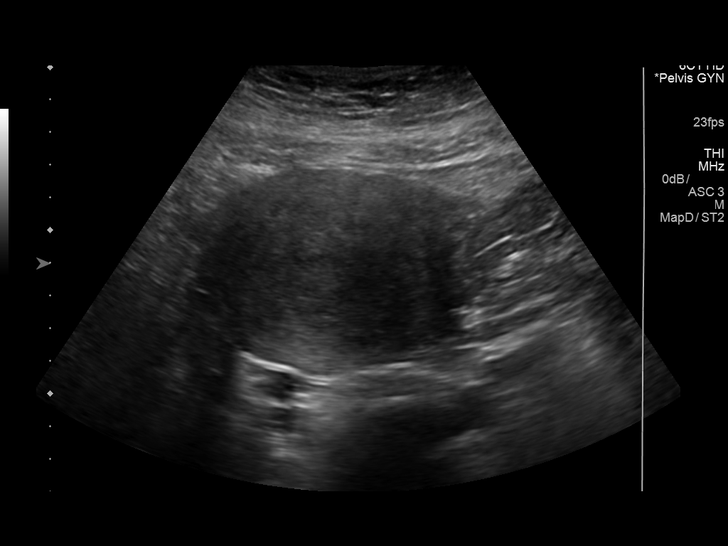
[im 17/44]
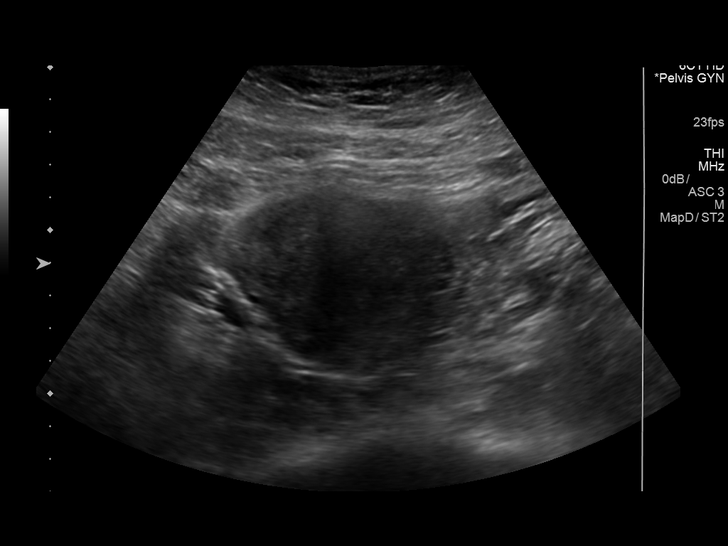
[im 20/44]
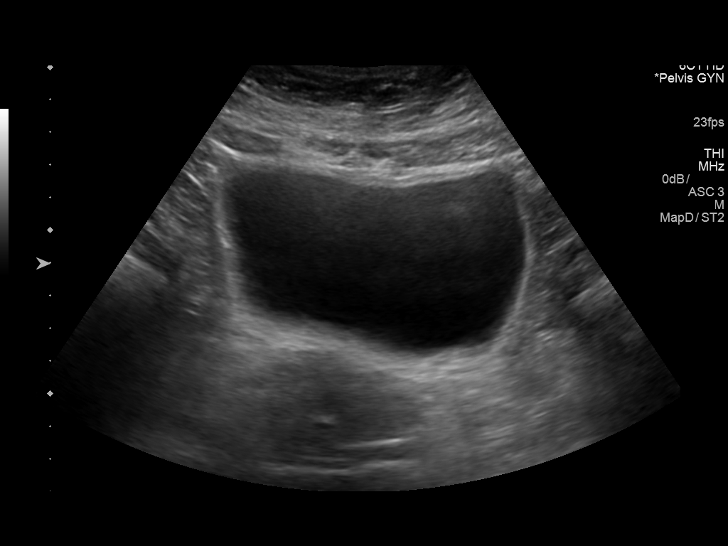
[im 24/44]
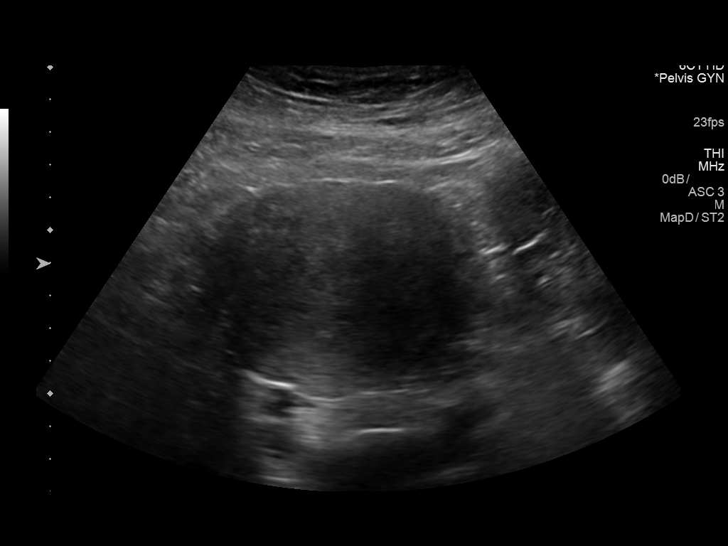
[im 27/44]
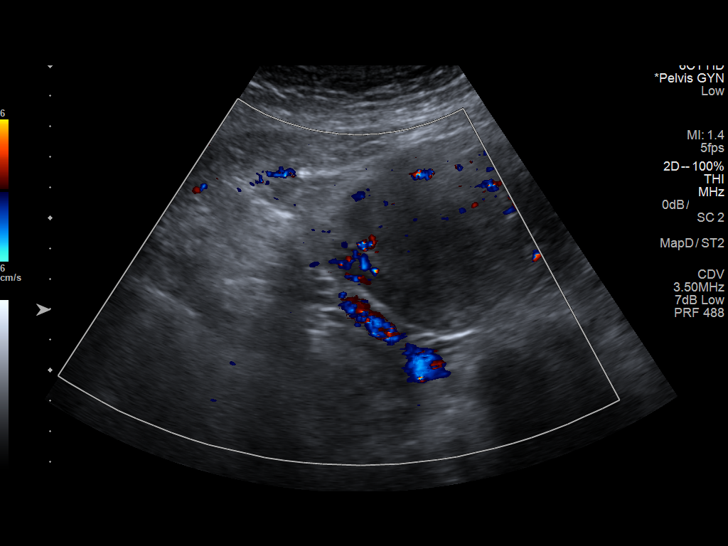
[im 29/44]
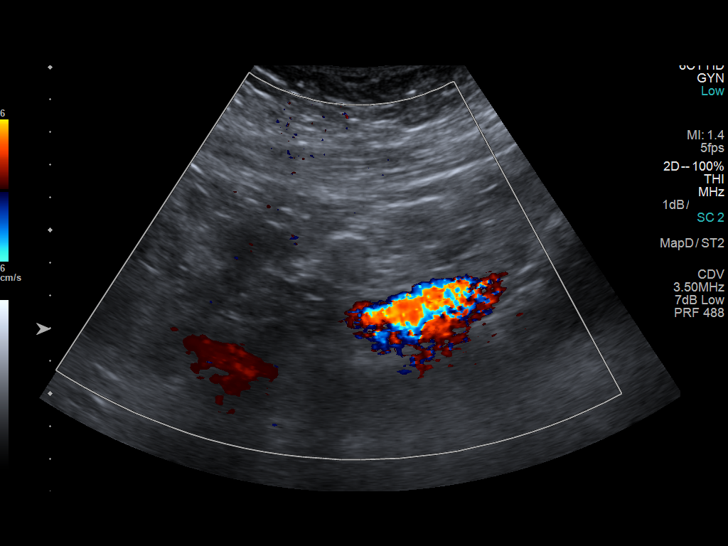
[im 33/44]
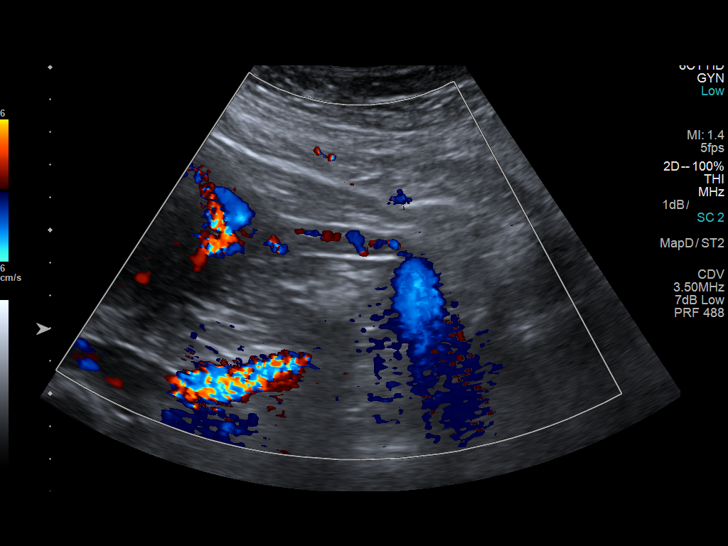
[im 36/44]
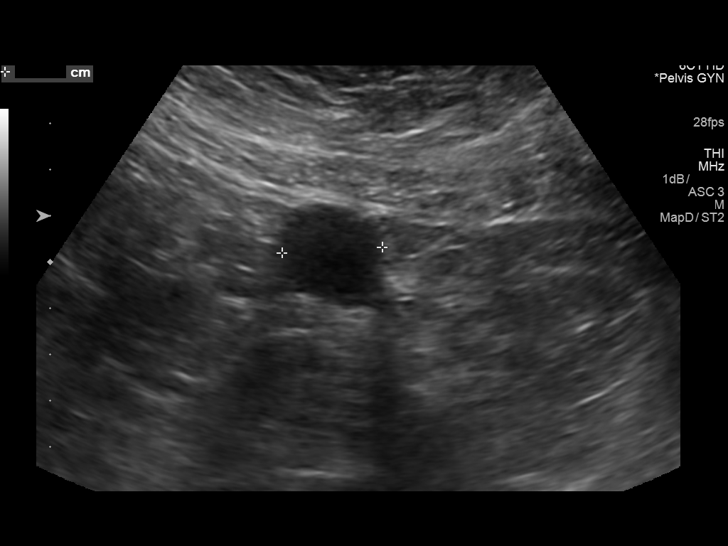
[im 40/44]
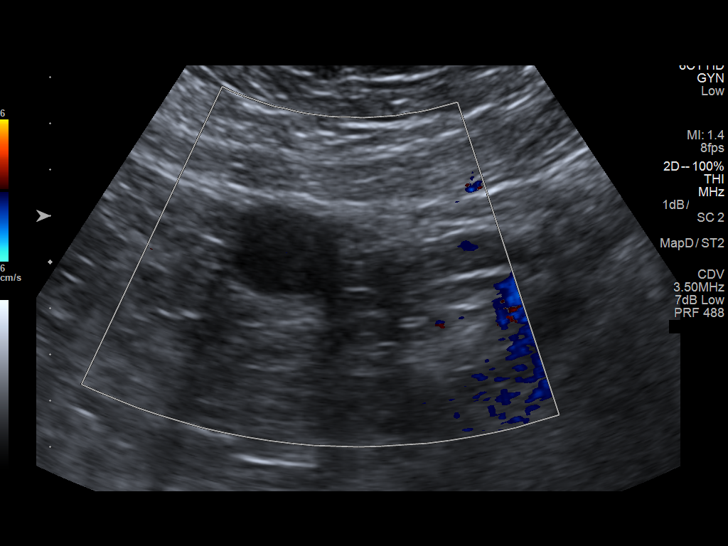
[im 44/44]
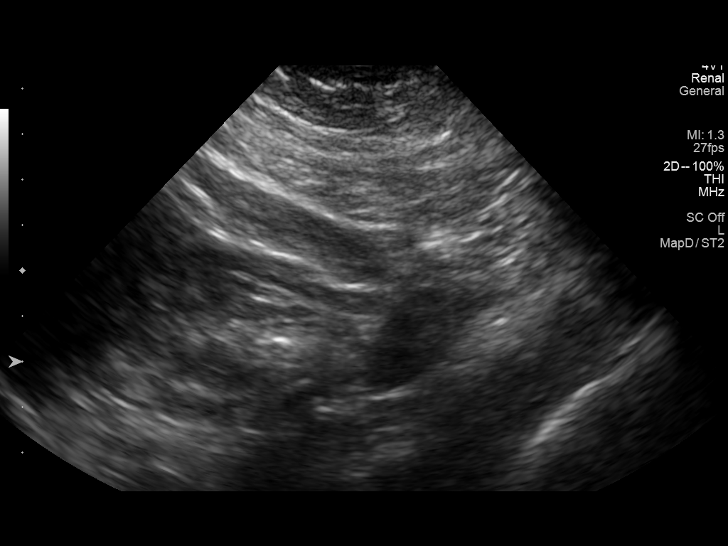

[14 of 25 positions shown; findings below may reference images not displayed]

FINDINGS: Uterus

Measurements: 12.7 x 6.3 x 7.3 cm. No fibroids or other mass
visualized.

Endometrium

Thickness: 6.2 mm.  No focal abnormality visualized.

Right ovary

Not seen.

Left ovary

Measurements: 4.0 x 2.2 x 2.3 cm. There is a hypoechoic
circumscribed mass on the left ovary which measures 2.6 x 2.2 x
cm. No internal blood flow is seen.

Other findings:  No abnormal free fluid.
IMPRESSION: Normal appearance of the uterus.

Nonvisualization of the right ovary.

2.6 cm hypoattenuated left ovarian mass. No internal vascularity is
demonstrated sonographically. This may represent a complicated cyst,
however solid mass cannot be entirely excluded. If further imaging
evaluation is desired, MRI of the pelvis may be considered.

## 2019-05-07 ENCOUNTER — Ambulatory Visit (HOSPITAL_COMMUNITY)
Admission: RE | Admit: 2019-05-07 | Discharge: 2019-05-07 | Disposition: A | Discharge status: Home / Self Care | Payer: Medicare Other | Source: Ambulatory Visit | Attending: Internal Medicine | Admitting: Internal Medicine

## 2019-05-07 ENCOUNTER — Other Ambulatory Visit: Payer: Self-pay

## 2019-05-07 DIAGNOSIS — Z1231 Encounter for screening mammogram for malignant neoplasm of breast: Secondary | ICD-10-CM | POA: Diagnosis not present

## 2019-06-16 ENCOUNTER — Encounter: Payer: Medicare Other | Attending: "Endocrinology | Admitting: Nutrition

## 2019-06-16 ENCOUNTER — Other Ambulatory Visit: Payer: Self-pay

## 2019-06-16 ENCOUNTER — Telehealth: Payer: Self-pay | Admitting: Nutrition

## 2019-06-16 NOTE — Patient Instructions (Addendum)
Goals Eat three meals a day Follow My Plate  Avoid skipping meals Lose 1-2 lbs per week Exercise 60 minutes daily.

## 2019-06-16 NOTE — Progress Notes (Signed)
  Medical Nutrition Therapy:  Appt start time: 1400 end time:  1500.   Assessment:  Primary concerns today: Pre Diabetes  And obesity. . Lives with husband and children. Eats 1-2 times per day. Sees Dr. Dorris Fetch, Endocrinology.  Metformin 500 mg daily. A1C 5.8%.  Wants to lsoe weight Skips meals at times. Tends to eat 2 meals per day.  Eats a lot of vegetables. Current diet is inconsistent in meals times and foods to provide needs for weight loss and blood sugar control. Needs increased physical activity.   Lab Results  Component Value Date   HGBA1C 5.8 01/19/2019   CMP Latest Ref Rng & Units 01/19/2019 05/25/2018 03/24/2018  Glucose 65 - 99 mg/dL - 106(H) -  BUN 4 - 21 16 16  -  Creatinine 0.5 - 1.1 0.9 0.76 -  Sodium 135 - 145 mmol/L - 142 -  Potassium 3.5 - 5.1 mmol/L - 3.9 -  Chloride 101 - 111 mmol/L - 108 -  CO2 22 - 32 mmol/L - 26 -  Calcium 8.9 - 10.3 mg/dL - 9.3 -  Total Protein 6.1 - 8.1 g/dL - - 6.8  Total Bilirubin 0.2 - 1.2 mg/dL - - 0.5  Alkaline Phos 38 - 126 U/L - - -  AST 10 - 30 U/L - - 17  ALT 6 - 29 U/L - - 13      Preferred Learning Style:    No preference indicated   Learning Readiness:   Ready  Change in progress   MEDICATIONS*   DIETARY INTAKE:  24-hr recall:  B ( AM):  Vegetables soup, Snk ( AM):  L ( PM): 4 pm chicken and vegetables-tomatoes, carrots, water Snk ( PM): D ( PM):skipped. Snk ( PM): Beverages:water.   Usual physical activity: ADL  Estimated energy needs: 1200  calories 135 g carbohydrates 90 g protein 33 g fat  Progress Towards Goal(s):  In progress.   Nutritional Diagnosis:  NB-1.1 Food and nutrition-related knowledge deficit As related to Diabetes.  As evidenced by A1C 5.8% with Metformin 500 mg BID.    Intervention:  Nutrition and Diabetes education provided on My Plate, CHO counting, meal planning, portion sizes, timing of meals, avoiding snacks between meals unless having a low blood sugar, target ranges for  A1C and blood sugars, signs/symptoms and treatment of hyper/hypoglycemia, monitoring blood sugars, taking medications as prescribed, benefits of exercising 30 minutes per day and prevention of complications of DM.  Goals  Follow My Plate  Eat 3 meals per day at times discussed Don't skip meals Walk 30 minutes a day Lose 2-3 lbs per month Avoid snacks Increase lower carb vegetables. .  Teaching Method Utilized: none Visual Auditory Hands on  Handouts given during visit include:  The Plate Method   Meal Plan Card   Barriers to learning/adherence to lifestyle change: none  Demonstrated degree of understanding via:  Teach Back   Monitoring/Evaluation:  Dietary intake, exercise, , and body weight in 1 month(s).

## 2019-06-16 NOTE — Telephone Encounter (Signed)
VM left to call to do DM assessment via phone.

## 2019-06-29 ENCOUNTER — Other Ambulatory Visit: Payer: Medicare Other | Admitting: Adult Health

## 2019-07-20 ENCOUNTER — Encounter: Payer: Self-pay | Admitting: Nutrition

## 2019-08-10 DIAGNOSIS — Z886 Allergy status to analgesic agent status: Secondary | ICD-10-CM | POA: Diagnosis not present

## 2019-08-10 DIAGNOSIS — Z79899 Other long term (current) drug therapy: Secondary | ICD-10-CM | POA: Diagnosis not present

## 2019-08-10 DIAGNOSIS — I10 Essential (primary) hypertension: Secondary | ICD-10-CM | POA: Diagnosis not present

## 2019-08-10 DIAGNOSIS — K0889 Other specified disorders of teeth and supporting structures: Secondary | ICD-10-CM | POA: Diagnosis not present

## 2019-08-10 DIAGNOSIS — E119 Type 2 diabetes mellitus without complications: Secondary | ICD-10-CM | POA: Diagnosis not present

## 2019-08-10 DIAGNOSIS — Z880 Allergy status to penicillin: Secondary | ICD-10-CM | POA: Diagnosis not present

## 2019-08-10 DIAGNOSIS — Z7984 Long term (current) use of oral hypoglycemic drugs: Secondary | ICD-10-CM | POA: Diagnosis not present

## 2019-08-12 DIAGNOSIS — Z1331 Encounter for screening for depression: Secondary | ICD-10-CM | POA: Diagnosis not present

## 2019-08-12 DIAGNOSIS — E119 Type 2 diabetes mellitus without complications: Secondary | ICD-10-CM | POA: Diagnosis not present

## 2019-08-12 DIAGNOSIS — Z1389 Encounter for screening for other disorder: Secondary | ICD-10-CM | POA: Diagnosis not present

## 2019-08-12 DIAGNOSIS — I10 Essential (primary) hypertension: Secondary | ICD-10-CM | POA: Diagnosis not present

## 2019-08-25 ENCOUNTER — Encounter: Payer: Medicare Other | Attending: "Endocrinology | Admitting: Nutrition

## 2019-08-25 ENCOUNTER — Telehealth: Payer: Self-pay | Admitting: Nutrition

## 2019-08-25 DIAGNOSIS — I10 Essential (primary) hypertension: Secondary | ICD-10-CM | POA: Diagnosis not present

## 2019-08-25 DIAGNOSIS — Z0001 Encounter for general adult medical examination with abnormal findings: Secondary | ICD-10-CM | POA: Diagnosis not present

## 2019-08-25 DIAGNOSIS — E1165 Type 2 diabetes mellitus with hyperglycemia: Secondary | ICD-10-CM | POA: Diagnosis not present

## 2019-08-25 NOTE — Telephone Encounter (Signed)
NO answer. VM left to call and reschedule phone visit.

## 2019-09-12 DIAGNOSIS — E119 Type 2 diabetes mellitus without complications: Secondary | ICD-10-CM | POA: Diagnosis not present

## 2019-09-12 DIAGNOSIS — E079 Disorder of thyroid, unspecified: Secondary | ICD-10-CM | POA: Diagnosis not present

## 2019-10-12 DIAGNOSIS — E079 Disorder of thyroid, unspecified: Secondary | ICD-10-CM | POA: Diagnosis not present

## 2019-10-12 DIAGNOSIS — I10 Essential (primary) hypertension: Secondary | ICD-10-CM | POA: Diagnosis not present

## 2019-10-14 DIAGNOSIS — R070 Pain in throat: Secondary | ICD-10-CM | POA: Diagnosis not present

## 2019-10-14 DIAGNOSIS — I10 Essential (primary) hypertension: Secondary | ICD-10-CM | POA: Diagnosis not present

## 2019-10-14 DIAGNOSIS — G5603 Carpal tunnel syndrome, bilateral upper limbs: Secondary | ICD-10-CM | POA: Diagnosis not present

## 2019-10-14 DIAGNOSIS — E119 Type 2 diabetes mellitus without complications: Secondary | ICD-10-CM | POA: Diagnosis not present

## 2019-10-20 DIAGNOSIS — Z1159 Encounter for screening for other viral diseases: Secondary | ICD-10-CM | POA: Diagnosis not present

## 2019-10-20 DIAGNOSIS — M549 Dorsalgia, unspecified: Secondary | ICD-10-CM | POA: Diagnosis not present

## 2019-10-20 DIAGNOSIS — R05 Cough: Secondary | ICD-10-CM | POA: Diagnosis not present

## 2019-10-20 DIAGNOSIS — R0602 Shortness of breath: Secondary | ICD-10-CM | POA: Diagnosis not present

## 2019-10-20 DIAGNOSIS — R079 Chest pain, unspecified: Secondary | ICD-10-CM | POA: Diagnosis not present

## 2019-10-20 DIAGNOSIS — R519 Headache, unspecified: Secondary | ICD-10-CM | POA: Diagnosis not present

## 2019-10-20 DIAGNOSIS — Z880 Allergy status to penicillin: Secondary | ICD-10-CM | POA: Diagnosis not present

## 2019-10-20 DIAGNOSIS — J45909 Unspecified asthma, uncomplicated: Secondary | ICD-10-CM | POA: Diagnosis not present

## 2019-10-20 DIAGNOSIS — E119 Type 2 diabetes mellitus without complications: Secondary | ICD-10-CM | POA: Diagnosis not present

## 2019-10-20 DIAGNOSIS — I10 Essential (primary) hypertension: Secondary | ICD-10-CM | POA: Diagnosis not present

## 2019-10-20 DIAGNOSIS — Z79899 Other long term (current) drug therapy: Secondary | ICD-10-CM | POA: Diagnosis not present

## 2019-10-20 DIAGNOSIS — Z20828 Contact with and (suspected) exposure to other viral communicable diseases: Secondary | ICD-10-CM | POA: Diagnosis not present

## 2019-10-20 DIAGNOSIS — Z7984 Long term (current) use of oral hypoglycemic drugs: Secondary | ICD-10-CM | POA: Diagnosis not present

## 2019-10-20 DIAGNOSIS — Z886 Allergy status to analgesic agent status: Secondary | ICD-10-CM | POA: Diagnosis not present

## 2019-10-27 ENCOUNTER — Ambulatory Visit: Payer: Medicare Other | Admitting: "Endocrinology

## 2019-12-20 IMAGING — MG DIGITAL SCREENING BILATERAL MAMMOGRAM WITH TOMO AND CAD
6 of 10 series · 6 of 30 positions shown · non-contrast
Comparison: Previous exam(s).

CLINICAL DATA: Screening.

EXAM:
DIGITAL SCREENING BILATERAL MAMMOGRAM WITH TOMO AND CAD

[R MLO synth-2D]
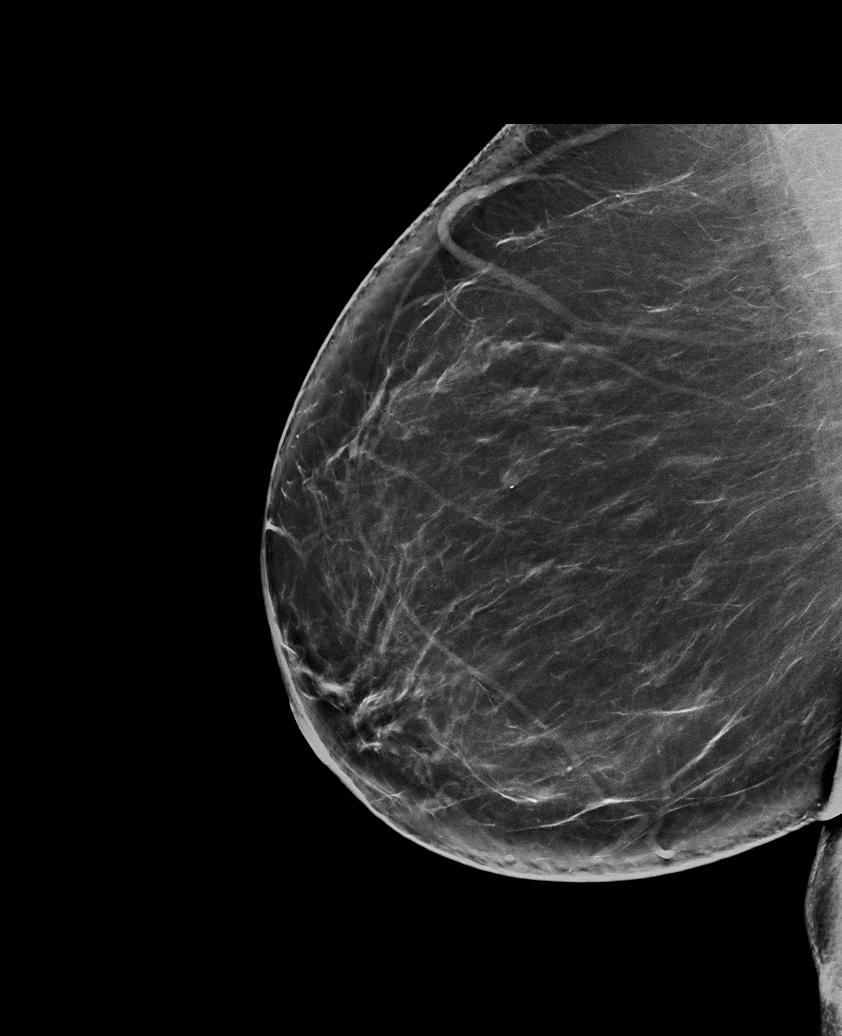

[R CC synth-2D]
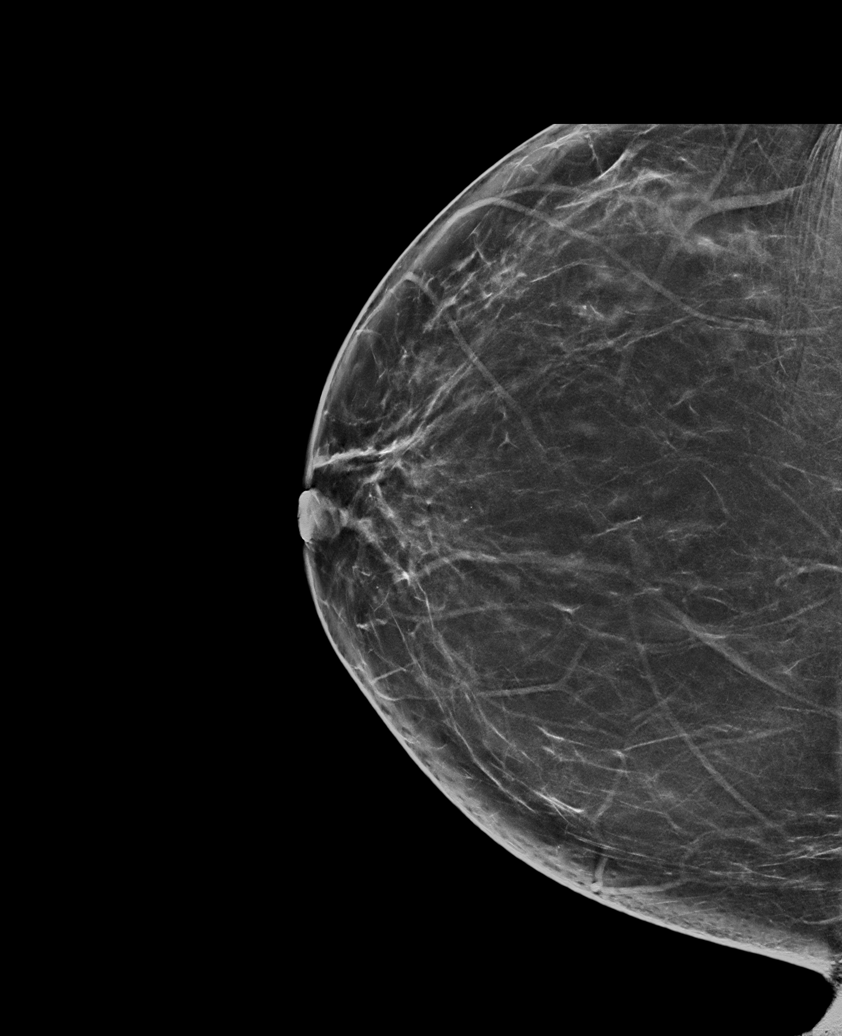

[L MLO synth-2D (1 of 2)]
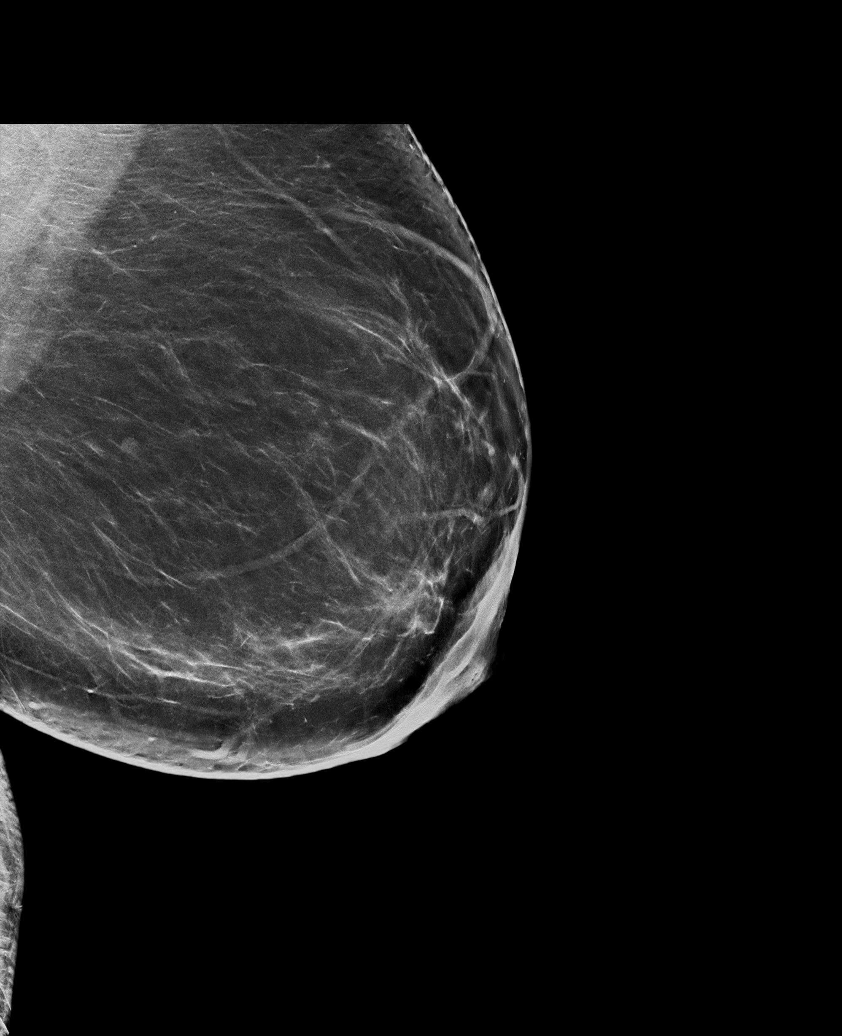

[L MLO synth-2D (2 of 2)]
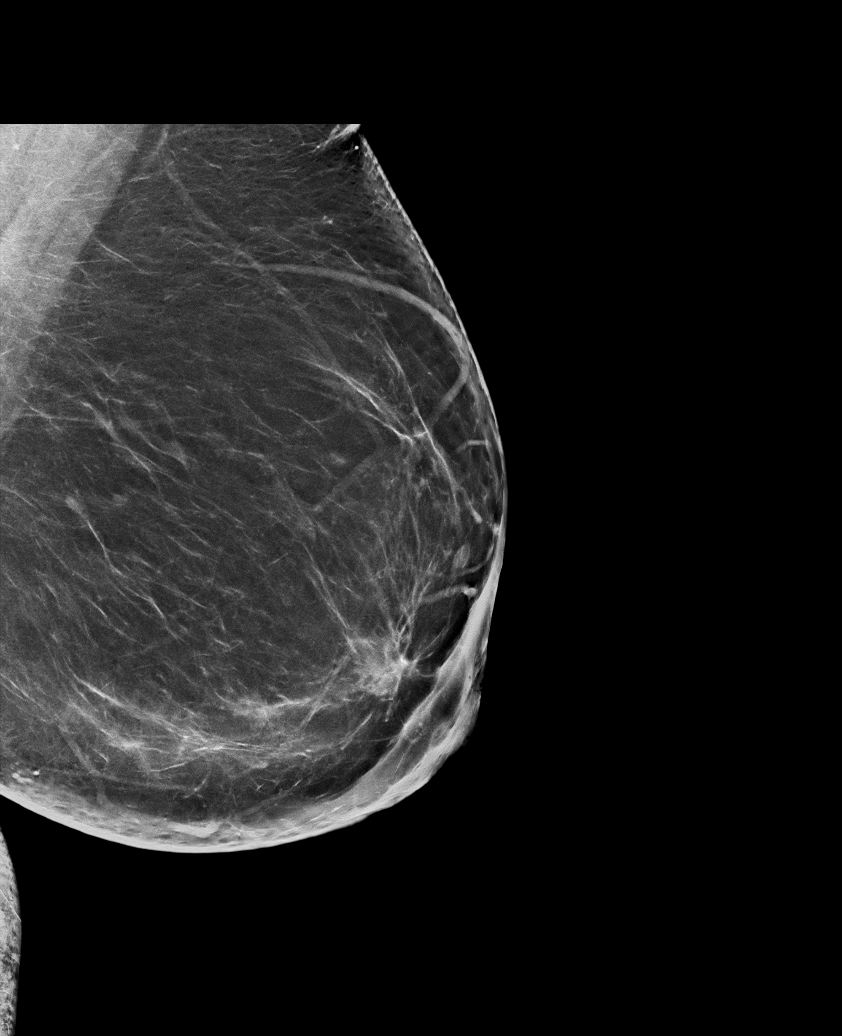

[L CC synth-2D]
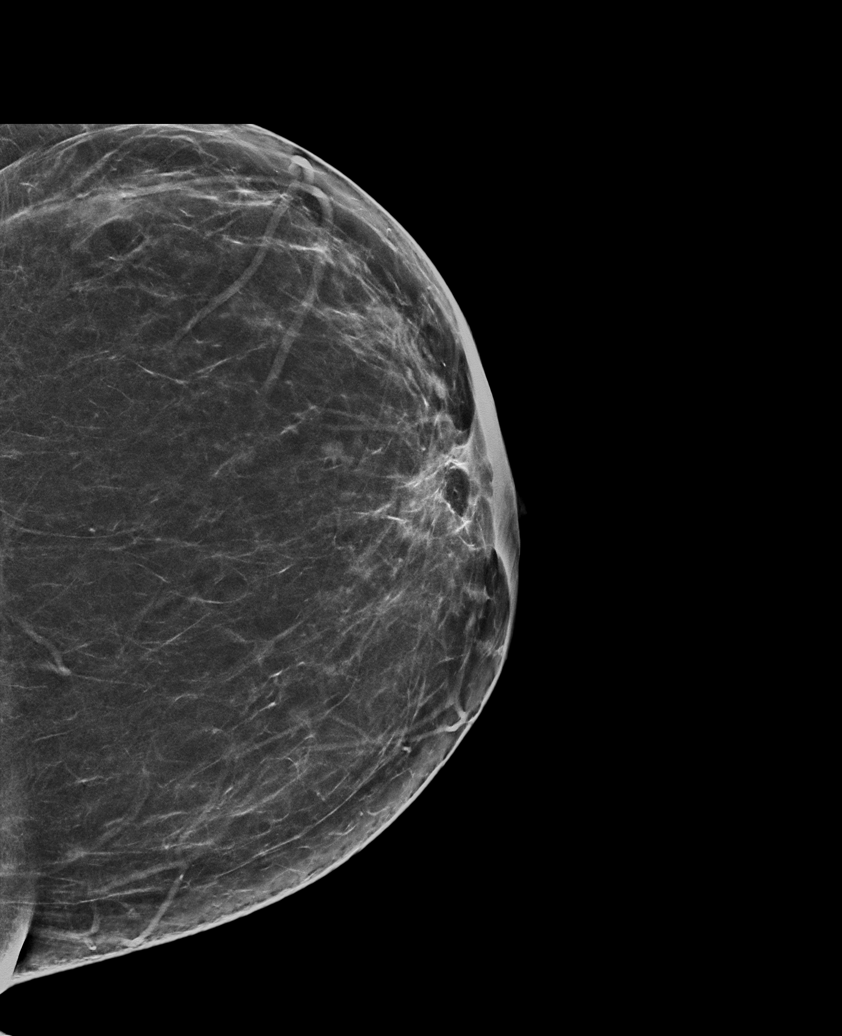

[L MLO tomo · tomo slice 47/93.0]
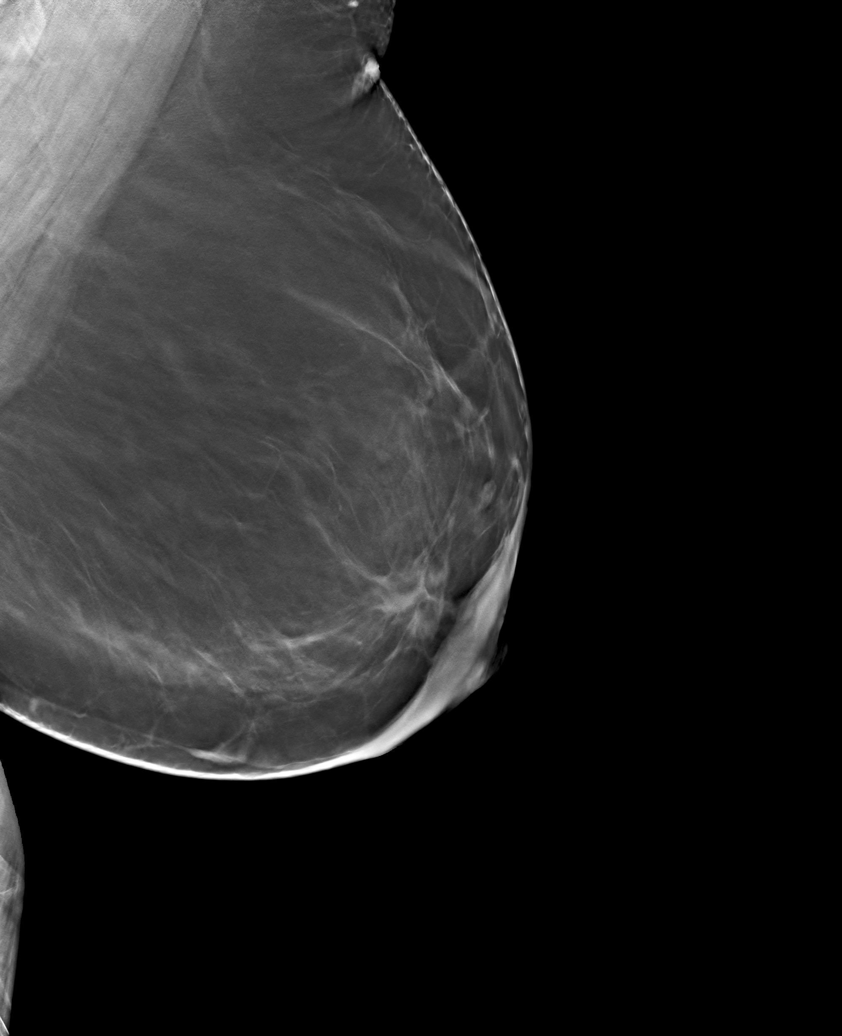

[6 of 30 positions shown; findings below may reference images not displayed]

ACR Breast Density Category b: There are scattered areas of
fibroglandular density.
FINDINGS: There are no findings suspicious for malignancy. Images were
processed with CAD.
IMPRESSION: No mammographic evidence of malignancy. A result letter of this
screening mammogram will be mailed directly to the patient.

RECOMMENDATION:
Screening mammogram in one year. (Code:[id number])

BI-RADS CATEGORY  1: Negative.

## 2019-12-31 DIAGNOSIS — J45909 Unspecified asthma, uncomplicated: Secondary | ICD-10-CM | POA: Diagnosis not present

## 2019-12-31 DIAGNOSIS — R1031 Right lower quadrant pain: Secondary | ICD-10-CM | POA: Diagnosis not present

## 2019-12-31 DIAGNOSIS — K529 Noninfective gastroenteritis and colitis, unspecified: Secondary | ICD-10-CM | POA: Diagnosis not present

## 2019-12-31 DIAGNOSIS — E119 Type 2 diabetes mellitus without complications: Secondary | ICD-10-CM | POA: Diagnosis not present

## 2019-12-31 DIAGNOSIS — Z7984 Long term (current) use of oral hypoglycemic drugs: Secondary | ICD-10-CM | POA: Diagnosis not present

## 2019-12-31 DIAGNOSIS — R1032 Left lower quadrant pain: Secondary | ICD-10-CM | POA: Diagnosis not present

## 2019-12-31 DIAGNOSIS — Z888 Allergy status to other drugs, medicaments and biological substances status: Secondary | ICD-10-CM | POA: Diagnosis not present

## 2019-12-31 DIAGNOSIS — Z880 Allergy status to penicillin: Secondary | ICD-10-CM | POA: Diagnosis not present

## 2019-12-31 DIAGNOSIS — R112 Nausea with vomiting, unspecified: Secondary | ICD-10-CM | POA: Diagnosis not present

## 2019-12-31 DIAGNOSIS — I10 Essential (primary) hypertension: Secondary | ICD-10-CM | POA: Diagnosis not present

## 2019-12-31 DIAGNOSIS — R197 Diarrhea, unspecified: Secondary | ICD-10-CM | POA: Diagnosis not present

## 2019-12-31 DIAGNOSIS — R1110 Vomiting, unspecified: Secondary | ICD-10-CM | POA: Diagnosis not present

## 2020-01-14 DIAGNOSIS — E119 Type 2 diabetes mellitus without complications: Secondary | ICD-10-CM | POA: Diagnosis not present

## 2020-01-14 DIAGNOSIS — E079 Disorder of thyroid, unspecified: Secondary | ICD-10-CM | POA: Diagnosis not present

## 2020-01-26 DIAGNOSIS — E119 Type 2 diabetes mellitus without complications: Secondary | ICD-10-CM | POA: Diagnosis not present

## 2020-01-26 DIAGNOSIS — G4733 Obstructive sleep apnea (adult) (pediatric): Secondary | ICD-10-CM | POA: Diagnosis not present

## 2020-03-14 ENCOUNTER — Ambulatory Visit
Admission: EM | Admit: 2020-03-14 | Discharge: 2020-03-14 | Disposition: A | Discharge status: Home / Self Care | Payer: Medicare Other | Attending: Emergency Medicine | Admitting: Emergency Medicine

## 2020-03-14 ENCOUNTER — Other Ambulatory Visit: Payer: Self-pay

## 2020-03-14 DIAGNOSIS — M25561 Pain in right knee: Secondary | ICD-10-CM | POA: Diagnosis not present

## 2020-03-14 DIAGNOSIS — E039 Hypothyroidism, unspecified: Secondary | ICD-10-CM | POA: Diagnosis not present

## 2020-03-14 DIAGNOSIS — E119 Type 2 diabetes mellitus without complications: Secondary | ICD-10-CM | POA: Diagnosis not present

## 2020-03-14 MED ORDER — NAPROXEN 500 MG PO TABS: 500.0000 mg | ORAL_TABLET | Freq: Two times a day (BID) | ORAL | 0 refills | Status: DC

## 2020-03-14 NOTE — Discharge Instructions (Signed)
Based on history and exam I am concerned you may have injured your meniscus We unfortunately do not have the imaging available to check for that.  You should follow up with your PCP or orthopedist for further evaluation and management Continue conservative management of rest, ice, elevation, and gentle stretches Knee brace given Take naproxen as needed for pain relief (may cause abdominal discomfort, ulcers, and GI bleeds avoid taking with other NSAIDs) Follow up with PCP or orthopedist Return or go to the ER if you have any new or worsening symptoms (fever, chills, chest pain, redness, worsening swelling, worsening pain, etc...)

## 2020-03-14 NOTE — ED Provider Notes (Signed)
[facility]   [id number] 03/14/20 Arrival Time: 9735  CC: RT knee PAIN  SUBJECTIVE: History from: patient and family. Paula Butler is a 49 y.o. female complains of RT knee pain that began last night/ this morning.  Symptoms began after getting up in the night to use the bathroom.  States she experienced a sharp pain to the inside of her knee.  Localizes the pain to the inside of knee.  Describes the pain as constant, achy, and intermittently sharp in character.  Has tried OTC medications without relief.  Symptoms are made worse with walking.  Denies similar symptoms in the past.  Complains of associated swelling.  Denies fever, chills, erythema, ecchymosis, weakness, numbness and tingling.      ROS: As per HPI.  All other pertinent ROS negative.     Past Medical History:  Diagnosis Date  . Diabetes mellitus without complication (HCC)   . Hyperlipidemia   . Hypertension    Past Surgical History:  Procedure Laterality Date  . CHOLECYSTECTOMY    . EXCISION OF BREAST BIOPSY Left 05/29/2018   Procedure: NIPPLE EXPLORATION/EXCISION RETROAREOLA TISSUE LEFT BREAST;  Surgeon: Lucretia Roers, MD;  Location: [facility];  Service: General;  Laterality: Left;  . TUBAL LIGATION     Allergies  Allergen Reactions  . Fish Allergy Anaphylaxis, Shortness Of Breath and Rash  . Shrimp [Shellfish Allergy] Anaphylaxis, Shortness Of Breath and Rash  . Asa [Aspirin] Other (See Comments)    Abdominal swelling   . Penicillins Hives, Itching and Rash    Has patient had a PCN reaction causing immediate rash, facial/tongue/throat swelling, SOB or lightheadedness with hypotension: Yes Has patient had a PCN reaction causing severe rash involving mucus membranes or skin necrosis: No Has patient had a PCN reaction that required hospitalization: Yes Has patient had a PCN reaction occurring within the last 10 years: Yes If all of the above answers are "NO", then may proceed with Cephalosporin use.    No current facility-administered medications on file prior to encounter.   Current Outpatient Medications on File Prior to Encounter  Medication Sig Dispense Refill  . atorvastatin (LIPITOR) 10 MG tablet Take 10 mg by mouth daily.    Marland Kitchen lisinopril (ZESTRIL) 2.5 MG tablet Take 2.5 mg by mouth daily.    . metFORMIN (GLUCOPHAGE XR) 500 MG 24 hr tablet Take 1 tablet (500 mg total) by mouth daily with breakfast. 30 tablet 3   Social History   Socioeconomic History  . Marital status: Married    Spouse name: Not on file  . Number of children: Not on file  . Years of education: Not on file  . Highest education level: Not on file  Occupational History  . Not on file  Tobacco Use  . Smoking status: Never Smoker  . Smokeless tobacco: Never Used  Substance and Sexual Activity  . Alcohol use: No  . Drug use: No  . Sexual activity: Not Currently    Partners: Male    Birth control/protection: Surgical    Comment: tubal  Other Topics Concern  . Not on file  Social History Narrative   Married for 10 years, 3 children, all grown/teenagers.    2 daughters, 1 son.   Attends church.    Walks for exercise.   Enjoys singing.   Eats all food groups.   Wears seatbelt.   Born in [place], grew up in [place]. Father in [employer]. Grandmother raised her.    Moved from [place] in  2018.    Does not work out side of home.    Social Determinants of Health   Financial Resource Strain:   . Difficulty of Paying Living Expenses:   Food Insecurity:   . Worried About Charity fundraiser in the Last Year:   . Arboriculturist in the Last Year:   Transportation Needs:   . Film/video editor (Medical):   Marland Kitchen Lack of Transportation (Non-Medical):   Physical Activity:   . Days of Exercise per Week:   . Minutes of Exercise per Session:   Stress:   . Feeling of Stress :   Social Connections:   . Frequency of Communication with Friends and Family:   . Frequency of Social Gatherings with Friends and  Family:   . Attends Religious Services:   . Active Member of Clubs or Organizations:   . Attends Archivist Meetings:   Marland Kitchen Marital Status:   Intimate Partner Violence:   . Fear of Current or Ex-Partner:   . Emotionally Abused:   Marland Kitchen Physically Abused:   . Sexually Abused:    Family History  Problem Relation Age of Onset  . Diabetes Mother   . Hypothyroidism Mother   . Prostate cancer Father   . Breast cancer Sister   . Stroke Brother     OBJECTIVE:  Vitals:   03/14/20 1005  BP: 126/84  Pulse: 67  Resp: 17  Temp: (!) 97.4 F (36.3 C)  TempSrc: Oral  SpO2: 97%    General appearance: ALERT; in no acute distress.  Head: NCAT Lungs: Normal respiratory effort Musculoskeletal: RT knee Inspection: Swelling over medial knee Palpation: TTP over MJL; Tenderness with varus stress, NT with valgus stress ROM: LROM about the knee Strength:  4/5 knee flexion, 4/5 knee extension Stability: Anterior/ posterior drawer intact Skin: warm and dry Neurologic: Ambulates without difficulty; Sensation intact about the upper/ lower extremities Psychological: alert and cooperative; normal mood and affect  ASSESSMENT & PLAN:  1. Acute pain of right knee   2. Medial joint line tenderness of knee, right    Meds ordered this encounter  Medications  . naproxen (NAPROSYN) 500 MG tablet    Sig: Take 1 tablet (500 mg total) by mouth 2 (two) times daily.    Dispense:  30 tablet    Refill:  0    Order Specific Question:   Supervising Provider    Answer:   Raylene Everts [[id number]]   Based on history and exam I am concerned you may have injured your meniscus We unfortunately do not have the imaging available to check for that.  You should follow up with your PCP or orthopedist for further evaluation and management Continue conservative management of rest, ice, elevation, and gentle stretches Knee brace given Take naproxen as needed for pain relief (may cause abdominal discomfort,  ulcers, and GI bleeds avoid taking with other NSAIDs) Follow up with PCP or orthopedist Return or go to the ER if you have any new or worsening symptoms (fever, chills, chest pain, redness, worsening swelling, worsening pain, etc...)   Reviewed expectations re: course of current medical issues. Questions answered. Outlined signs and symptoms indicating need for more acute intervention. Patient verbalized understanding. After Visit Summary given.    Lestine Box, PA-C 03/14/20 1038

## 2020-03-14 NOTE — ED Triage Notes (Signed)
Pt presents with complaints of right leg pain. Reports that she sat up this morning in bed and felt a pain from her right hip down her right leg. Reports the pain is primarily around her right knee right now. The pain is warm and sharp in nature. Pt is ambulatory.

## 2020-03-21 ENCOUNTER — Encounter: Payer: Self-pay | Admitting: Orthopaedic Surgery

## 2020-03-21 ENCOUNTER — Ambulatory Visit: Payer: Medicare Other

## 2020-03-21 ENCOUNTER — Ambulatory Visit (INDEPENDENT_AMBULATORY_CARE_PROVIDER_SITE_OTHER): Payer: Medicare Other | Admitting: Orthopaedic Surgery

## 2020-03-21 ENCOUNTER — Other Ambulatory Visit: Payer: Self-pay

## 2020-03-21 VITALS — BP 125/89 | HR 68 | Ht 65.0 in | Wt 258.0 lb

## 2020-03-21 DIAGNOSIS — M25561 Pain in right knee: Secondary | ICD-10-CM

## 2020-03-21 MED ORDER — HYDROCODONE-ACETAMINOPHEN 5-325 MG PO TABS: ORAL_TABLET | ORAL | 0 refills | Status: DC

## 2020-03-21 MED ORDER — NAPROXEN 500 MG PO TABS: 500.0000 mg | ORAL_TABLET | Freq: Two times a day (BID) | ORAL | 5 refills | Status: DC

## 2020-03-21 NOTE — Progress Notes (Signed)
Subjective:    Patient ID: Paula Butler, female    DOB: 09/07/1971, 49 y.o.   MRN: [id number]  HPI She has had right knee pain for several weeks. She was seen 03-14-2020 at the Urgent Care for pain of the right knee.  She has swelling, popping and giving way.  She has no trauma, no redness.  She is taking naprosyn.  She has no numbness.  Her pain got worse over this past weekend.    I have reviewed the notes from Urgent Care   Review of Systems  Constitutional: Positive for activity change.  Musculoskeletal: Positive for arthralgias, gait problem and joint swelling.  All other systems reviewed and are negative.  For Review of Systems, all other systems reviewed and are negative.  The following is a summary of the past history medically, past history surgically, known current medicines, social history and family history.  This information is gathered electronically by the computer from prior information and documentation.  I review this each visit and have found including this information at this point in the chart is beneficial and informative.   Past Medical History:  Diagnosis Date  . Diabetes mellitus without complication (HCC)   . Hyperlipidemia   . Hypertension     Past Surgical History:  Procedure Laterality Date  . CHOLECYSTECTOMY    . EXCISION OF BREAST BIOPSY Left 05/29/2018   Procedure: NIPPLE EXPLORATION/EXCISION RETROAREOLA TISSUE LEFT BREAST;  Surgeon: Lucretia Roers, MD;  Location: [facility];  Service: General;  Laterality: Left;  . TUBAL LIGATION      Current Outpatient Medications on File Prior to Visit  Medication Sig Dispense Refill  . atorvastatin (LIPITOR) 10 MG tablet Take 10 mg by mouth daily.    Marland Kitchen lisinopril (ZESTRIL) 2.5 MG tablet Take 2.5 mg by mouth daily.    . metFORMIN (GLUCOPHAGE XR) 500 MG 24 hr tablet Take 1 tablet (500 mg total) by mouth daily with breakfast. 30 tablet 3   No current facility-administered medications on file prior to  visit.    Social History   Socioeconomic History  . Marital status: Married    Spouse name: Not on file  . Number of children: Not on file  . Years of education: Not on file  . Highest education level: Not on file  Occupational History  . Not on file  Tobacco Use  . Smoking status: Never Smoker  . Smokeless tobacco: Never Used  Substance and Sexual Activity  . Alcohol use: No  . Drug use: No  . Sexual activity: Not Currently    Partners: Male    Birth control/protection: Surgical    Comment: tubal  Other Topics Concern  . Not on file  Social History Narrative   Married for 10 years, 3 children, all grown/teenagers.    2 daughters, 1 son.   Attends church.    Walks for exercise.   Enjoys singing.   Eats all food groups.   Wears seatbelt.   Born in [place], grew up in [place]. Father in [employer]. Grandmother raised her.    Moved from [place] in 2018.    Does not work out side of home.    Social Determinants of Health   Financial Resource Strain:   . Difficulty of Paying Living Expenses:   Food Insecurity:   . Worried About Programme researcher, broadcasting/film/video in the Last Year:   . Barista in the Last Year:   Transportation Needs:   . Lack  of Transportation (Medical):   Marland Kitchen Lack of Transportation (Non-Medical):   Physical Activity:   . Days of Exercise per Week:   . Minutes of Exercise per Session:   Stress:   . Feeling of Stress :   Social Connections:   . Frequency of Communication with Friends and Family:   . Frequency of Social Gatherings with Friends and Family:   . Attends Religious Services:   . Active Member of Clubs or Organizations:   . Attends Archivist Meetings:   Marland Kitchen Marital Status:   Intimate Partner Violence:   . Fear of Current or Ex-Partner:   . Emotionally Abused:   Marland Kitchen Physically Abused:   . Sexually Abused:     Family History  Problem Relation Age of Onset  . Diabetes Mother   . Hypothyroidism Mother   . Prostate cancer Father   .  Breast cancer Sister   . Stroke Brother     BP 125/89   Pulse 68   Ht 5\' 5"  (1.651 m)   Wt 258 lb (117 kg)   BMI 42.93 kg/m   Body mass index is 42.93 kg/m.     Objective:   Physical Exam Vitals and nursing note reviewed.  Constitutional:      Appearance: She is well-developed.  HENT:     Head: Normocephalic and atraumatic.  Eyes:     Conjunctiva/sclera: Conjunctivae normal.     Pupils: Pupils are equal, round, and reactive to light.  Cardiovascular:     Rate and Rhythm: Normal rate and regular rhythm.  Pulmonary:     Effort: Pulmonary effort is normal.  Abdominal:     Palpations: Abdomen is soft.  Musculoskeletal:     Cervical back: Normal range of motion and neck supple.       Legs:  Skin:    General: Skin is warm and dry.  Neurological:     Mental Status: She is alert and oriented to person, place, and time.     Cranial Nerves: No cranial nerve deficit.     Motor: No abnormal muscle tone.     Coordination: Coordination normal.     Deep Tendon Reflexes: Reflexes are normal and symmetric. Reflexes normal.  Psychiatric:        Behavior: Behavior normal.        Thought Content: Thought content normal.        Judgment: Judgment normal.     X-rays were done of the right knee, reported separately.      Assessment & Plan:   Encounter Diagnosis  Name Primary?  . Acute pain of right knee Yes   PROCEDURE NOTE:  The patient requests injections of the right knee , verbal consent was obtained.  The right knee was prepped appropriately after time out was performed.   Sterile technique was observed and injection of 1 cc of Depo-Medrol 40 mg with several cc's of plain xylocaine. Anesthesia was provided by ethyl chloride and a 20-gauge needle was used to inject the knee area. The injection was tolerated well.  A band aid dressing was applied.  The patient was advised to apply ice later today and tomorrow to the injection sight as needed.  I will get MRI of the  right knee.  I am concerned about medial meniscus tear.  Rx for Naprosyn and Norco given.  I have reviewed the [facility] web site prior to prescribing narcotic medicine for this patient.   Return in two weeks.  Call if any problem.  Precautions discussed.   Electronically Signed Darreld Mclean, MD 4/6/20219:59 AM

## 2020-04-06 ENCOUNTER — Ambulatory Visit: Payer: Medicare Other | Admitting: Orthopaedic Surgery

## 2020-04-14 DIAGNOSIS — I10 Essential (primary) hypertension: Secondary | ICD-10-CM | POA: Diagnosis not present

## 2020-04-14 DIAGNOSIS — E119 Type 2 diabetes mellitus without complications: Secondary | ICD-10-CM | POA: Diagnosis not present

## 2020-04-22 ENCOUNTER — Ambulatory Visit
Admission: RE | Admit: 2020-04-22 | Discharge: 2020-04-22 | Disposition: A | Discharge status: Home / Self Care | Payer: Medicare Other | Source: Ambulatory Visit | Attending: Orthopaedic Surgery | Admitting: Orthopaedic Surgery

## 2020-04-22 DIAGNOSIS — M25561 Pain in right knee: Secondary | ICD-10-CM

## 2020-04-27 ENCOUNTER — Ambulatory Visit: Payer: Medicare Other | Admitting: Orthopaedic Surgery

## 2020-04-27 ENCOUNTER — Encounter: Payer: Self-pay | Admitting: Orthopaedic Surgery

## 2020-06-29 DIAGNOSIS — N858 Other specified noninflammatory disorders of uterus: Secondary | ICD-10-CM | POA: Diagnosis not present

## 2020-06-29 DIAGNOSIS — K2900 Acute gastritis without bleeding: Secondary | ICD-10-CM | POA: Diagnosis not present

## 2020-06-29 DIAGNOSIS — R1033 Periumbilical pain: Secondary | ICD-10-CM | POA: Diagnosis not present

## 2020-06-29 DIAGNOSIS — R112 Nausea with vomiting, unspecified: Secondary | ICD-10-CM | POA: Diagnosis not present

## 2020-06-29 DIAGNOSIS — R140 Abdominal distension (gaseous): Secondary | ICD-10-CM | POA: Diagnosis not present

## 2020-07-14 DIAGNOSIS — S61231A Puncture wound without foreign body of left index finger without damage to nail, initial encounter: Secondary | ICD-10-CM | POA: Diagnosis not present

## 2020-07-14 DIAGNOSIS — E119 Type 2 diabetes mellitus without complications: Secondary | ICD-10-CM | POA: Diagnosis not present

## 2020-07-14 DIAGNOSIS — W461XXA Contact with contaminated hypodermic needle, initial encounter: Secondary | ICD-10-CM | POA: Diagnosis not present

## 2020-07-14 DIAGNOSIS — Z23 Encounter for immunization: Secondary | ICD-10-CM | POA: Diagnosis not present

## 2020-07-14 DIAGNOSIS — S61032A Puncture wound without foreign body of left thumb without damage to nail, initial encounter: Secondary | ICD-10-CM | POA: Diagnosis not present

## 2020-07-14 DIAGNOSIS — Z7721 Contact with and (suspected) exposure to potentially hazardous body fluids: Secondary | ICD-10-CM | POA: Diagnosis not present

## 2020-07-14 DIAGNOSIS — I10 Essential (primary) hypertension: Secondary | ICD-10-CM | POA: Diagnosis not present

## 2020-07-14 DIAGNOSIS — E7849 Other hyperlipidemia: Secondary | ICD-10-CM | POA: Diagnosis not present

## 2020-07-19 ENCOUNTER — Other Ambulatory Visit (HOSPITAL_COMMUNITY): Payer: Self-pay | Admitting: Internal Medicine

## 2020-07-19 DIAGNOSIS — Z1389 Encounter for screening for other disorder: Secondary | ICD-10-CM | POA: Diagnosis not present

## 2020-07-19 DIAGNOSIS — Z1231 Encounter for screening mammogram for malignant neoplasm of breast: Secondary | ICD-10-CM

## 2020-07-19 DIAGNOSIS — E119 Type 2 diabetes mellitus without complications: Secondary | ICD-10-CM | POA: Diagnosis not present

## 2020-07-19 DIAGNOSIS — S61441S Puncture wound with foreign body of right hand, sequela: Secondary | ICD-10-CM | POA: Diagnosis not present

## 2020-07-19 DIAGNOSIS — Z1331 Encounter for screening for depression: Secondary | ICD-10-CM | POA: Diagnosis not present

## 2020-07-19 DIAGNOSIS — Z0001 Encounter for general adult medical examination with abnormal findings: Secondary | ICD-10-CM | POA: Diagnosis not present

## 2020-07-19 DIAGNOSIS — I10 Essential (primary) hypertension: Secondary | ICD-10-CM | POA: Diagnosis not present

## 2020-08-14 DIAGNOSIS — A084 Viral intestinal infection, unspecified: Secondary | ICD-10-CM | POA: Diagnosis not present

## 2020-08-14 DIAGNOSIS — R109 Unspecified abdominal pain: Secondary | ICD-10-CM | POA: Diagnosis not present

## 2020-08-14 DIAGNOSIS — R110 Nausea: Secondary | ICD-10-CM | POA: Diagnosis not present

## 2020-08-24 ENCOUNTER — Ambulatory Visit (HOSPITAL_COMMUNITY): Payer: Medicare Other

## 2020-08-31 ENCOUNTER — Other Ambulatory Visit: Payer: Self-pay

## 2020-08-31 ENCOUNTER — Ambulatory Visit
Admission: EM | Admit: 2020-08-31 | Discharge: 2020-08-31 | Disposition: A | Discharge status: Home / Self Care | Payer: Medicare Other | Attending: Emergency Medicine | Admitting: Emergency Medicine

## 2020-08-31 ENCOUNTER — Encounter: Payer: Self-pay | Admitting: Emergency Medicine

## 2020-08-31 DIAGNOSIS — M722 Plantar fascial fibromatosis: Secondary | ICD-10-CM | POA: Diagnosis not present

## 2020-08-31 MED ORDER — PREDNISONE 10 MG PO TABS: 20.0000 mg | ORAL_TABLET | Freq: Every day | ORAL | 0 refills | Status: DC

## 2020-08-31 NOTE — ED Provider Notes (Signed)
[facility]   [id number] 08/31/20 Arrival Time: 1653   Chief Complaint  Patient presents with  . Leg Swelling     SUBJECTIVE: History from: patient and family.  Paula Butler is a 49 y.o. female presented to the urgent care for complaint of pain to bilateral foot for the past 3 days.  I denies precipitating event.  Localized pain and swelling to bilateral heel and sole of feet.  She describes the pain as constant and achy.  She has tried OTC medications without relief.  Her symptoms are made worse with ROM.  She denies similar symptoms in the past.  Denies chills, fever, nausea, vomiting, diarrhea.     ROS: As per HPI.  All other pertinent ROS negative.     Past Medical History:  Diagnosis Date  . Diabetes mellitus without complication (HCC)   . Hyperlipidemia   . Hypertension    Past Surgical History:  Procedure Laterality Date  . CHOLECYSTECTOMY    . EXCISION OF BREAST BIOPSY Left 05/29/2018   Procedure: NIPPLE EXPLORATION/EXCISION RETROAREOLA TISSUE LEFT BREAST;  Surgeon: Lucretia Roers, MD;  Location: [facility];  Service: General;  Laterality: Left;  . TUBAL LIGATION     Allergies  Allergen Reactions  . Fish Allergy Anaphylaxis, Shortness Of Breath and Rash  . Shrimp [Shellfish Allergy] Anaphylaxis, Shortness Of Breath and Rash  . Asa [Aspirin] Other (See Comments)    Abdominal swelling   . Penicillins Hives, Itching and Rash    Has patient had a PCN reaction causing immediate rash, facial/tongue/throat swelling, SOB or lightheadedness with hypotension: Yes Has patient had a PCN reaction causing severe rash involving mucus membranes or skin necrosis: No Has patient had a PCN reaction that required hospitalization: Yes Has patient had a PCN reaction occurring within the last 10 years: Yes If all of the above answers are "NO", then may proceed with Cephalosporin use.    No current facility-administered medications on file prior to encounter.    Current Outpatient Medications on File Prior to Encounter  Medication Sig Dispense Refill  . atorvastatin (LIPITOR) 10 MG tablet Take 10 mg by mouth daily.    Marland Kitchen HYDROcodone-acetaminophen (NORCO/VICODIN) 5-325 MG tablet One tablet every four hours for pain. 30 tablet 0  . lisinopril (ZESTRIL) 2.5 MG tablet Take 2.5 mg by mouth daily.    . metFORMIN (GLUCOPHAGE XR) 500 MG 24 hr tablet Take 1 tablet (500 mg total) by mouth daily with breakfast. 30 tablet 3  . naproxen (NAPROSYN) 500 MG tablet Take 1 tablet (500 mg total) by mouth 2 (two) times daily with a meal. 60 tablet 5   Social History   Socioeconomic History  . Marital status: Married    Spouse name: Not on file  . Number of children: Not on file  . Years of education: Not on file  . Highest education level: Not on file  Occupational History  . Not on file  Tobacco Use  . Smoking status: Never Smoker  . Smokeless tobacco: Never Used  Vaping Use  . Vaping Use: Never used  Substance and Sexual Activity  . Alcohol use: No  . Drug use: No  . Sexual activity: Not Currently    Partners: Male    Birth control/protection: Surgical    Comment: tubal  Other Topics Concern  . Not on file  Social History Narrative   Married for 10 years, 3 children, all grown/teenagers.    2 daughters, 1 son.   Attends church.  Walks for exercise.   Enjoys singing.   Eats all food groups.   Wears seatbelt.   Born in [place], grew up in [place]. Father in [employer]. Grandmother raised her.    Moved from [place] in 2018.    Does not work out side of home.    Social Determinants of Health   Financial Resource Strain:   . Difficulty of Paying Living Expenses: Not on file  Food Insecurity:   . Worried About Programme researcher, broadcasting/film/video in the Last Year: Not on file  . Ran Out of Food in the Last Year: Not on file  Transportation Needs:   . Lack of Transportation (Medical): Not on file  . Lack of Transportation (Non-Medical): Not on file  Physical  Activity:   . Days of Exercise per Week: Not on file  . Minutes of Exercise per Session: Not on file  Stress:   . Feeling of Stress : Not on file  Social Connections:   . Frequency of Communication with Friends and Family: Not on file  . Frequency of Social Gatherings with Friends and Family: Not on file  . Attends Religious Services: Not on file  . Active Member of Clubs or Organizations: Not on file  . Attends Banker Meetings: Not on file  . Marital Status: Not on file  Intimate Partner Violence:   . Fear of Current or Ex-Partner: Not on file  . Emotionally Abused: Not on file  . Physically Abused: Not on file  . Sexually Abused: Not on file   Family History  Problem Relation Age of Onset  . Diabetes Mother   . Hypothyroidism Mother   . Prostate cancer Father   . Breast cancer Sister   . Stroke Brother     OBJECTIVE:  Vitals:   08/31/20 1717  BP: 123/82  Pulse: 89  Resp: 19  Temp: 98.3 F (36.8 C)  TempSrc: Oral  SpO2: 97%  Weight: 265 lb (120.2 kg)  Height: 5\' 4"  (1.626 m)     Physical Exam Vitals and nursing note reviewed.  Constitutional:      General: She is not in acute distress.    Appearance: Normal appearance. She is normal weight. She is not ill-appearing, toxic-appearing or diaphoretic.  HENT:     Head: Normocephalic.  Cardiovascular:     Rate and Rhythm: Normal rate and regular rhythm.     Pulses: Normal pulses.     Heart sounds: Normal heart sounds. No murmur heard.  No friction rub. No gallop.   Pulmonary:     Effort: Pulmonary effort is normal. No respiratory distress.     Breath sounds: Normal breath sounds. No stridor. No wheezing, rhonchi or rales.  Chest:     Chest wall: No tenderness.  Musculoskeletal:        General: Tenderness present.     Comments: The right foot is without obvious asymmetry or deformity compared to the left foot.  No surface trauma, ecchymosis, open wound, or warmth present.  Mild swelling in  bilateral feet.  Tenderness on palpation on sole of bilateral feet.  Neurovascular status intact  Neurological:     Mental Status: She is alert and oriented to person, place, and time.      LABS:  No results found for this or any previous visit (from the past 24 hour(s)).   ASSESSMENT & PLAN:  1. Plantar fasciitis, bilateral     Meds ordered this encounter  Medications  . predniSONE (DELTASONE) 10  MG tablet    Sig: Take 2 tablets (20 mg total) by mouth daily.    Dispense:  15 tablet    Refill:  0    Discharge instructions  Prednisone was prescribed take as directed Take ibuprofen as needed for pain Avoid standing for long period of time Wearing shoes that have good protective arch support Avoid high arc near feet Used freeze bottle of water to massage sole of foot Follow-up with PCP/podiatry Return or go to ED for worsening of symptoms  Reviewed expectations re: course of current medical issues. Questions answered. Outlined signs and symptoms indicating need for more acute intervention. Patient verbalized understanding. After Visit Summary given.         Durward Parcel, FNP 08/31/20 1744

## 2020-08-31 NOTE — Discharge Instructions (Addendum)
Prednisone was prescribed take as directed Take ibuprofen as needed for pain Avoid standing for long period of time Wearing shoes that have good protective arch support Avoid high arc near feet Used freeze bottle of water to massage sole of foot Follow-up with PCP/podiatry Return or go to ED for worsening of symptoms

## 2020-08-31 NOTE — ED Triage Notes (Signed)
Swelling to bilateral lower extremities x  3 days

## 2020-09-14 DIAGNOSIS — I10 Essential (primary) hypertension: Secondary | ICD-10-CM | POA: Diagnosis not present

## 2020-09-14 DIAGNOSIS — E119 Type 2 diabetes mellitus without complications: Secondary | ICD-10-CM | POA: Diagnosis not present

## 2020-10-07 ENCOUNTER — Other Ambulatory Visit: Payer: Self-pay | Admitting: General Surgery

## 2020-10-14 DIAGNOSIS — I10 Essential (primary) hypertension: Secondary | ICD-10-CM | POA: Diagnosis not present

## 2020-10-14 DIAGNOSIS — E119 Type 2 diabetes mellitus without complications: Secondary | ICD-10-CM | POA: Diagnosis not present

## 2020-11-06 ENCOUNTER — Ambulatory Visit
Admission: EM | Admit: 2020-11-06 | Discharge: 2020-11-06 | Disposition: A | Discharge status: Home / Self Care | Payer: Medicare Other | Attending: Emergency Medicine | Admitting: Emergency Medicine

## 2020-11-06 DIAGNOSIS — H60312 Diffuse otitis externa, left ear: Secondary | ICD-10-CM | POA: Diagnosis not present

## 2020-11-06 DIAGNOSIS — H9202 Otalgia, left ear: Secondary | ICD-10-CM

## 2020-11-06 MED ORDER — AZITHROMYCIN 250 MG PO TABS: 250.0000 mg | ORAL_TABLET | Freq: Every day | ORAL | 0 refills | Status: DC

## 2020-11-06 MED ORDER — CIPROFLOXACIN-DEXAMETHASONE 0.3-0.1 % OT SUSP: 4.0000 [drp] | Freq: Two times a day (BID) | OTIC | 0 refills | Status: DC

## 2020-11-06 NOTE — ED Provider Notes (Signed)
[facility]   [id number] 11/06/20 Arrival Time: 1242  CC: EAR PAIN  SUBJECTIVE: History from: patient.  Paula Butler is a 49 y.o. female who presents with of LT ear pain x 1 day.  States she was outside this past weekend and had the wind blowing in her ears.  Patient states the pain is constant and achy in character.  Patient has tried OTC ear drops without relief.  Symptoms are made worse with lying down.  Reports muffled hearing in LT ear, fatigue, and associated throat pain.  Denies fever, chills, sinus pain, rhinorrhea, ear discharge, SOB, wheezing, chest pain, nausea, changes in bowel or bladder habits.    ROS: As per HPI.  All other pertinent ROS negative.     Past Medical History:  Diagnosis Date  . Diabetes mellitus without complication (HCC)   . Hyperlipidemia   . Hypertension    Past Surgical History:  Procedure Laterality Date  . CHOLECYSTECTOMY    . EXCISION OF BREAST BIOPSY Left 05/29/2018   Procedure: NIPPLE EXPLORATION/EXCISION RETROAREOLA TISSUE LEFT BREAST;  Surgeon: Lucretia Roers, MD;  Location: [facility];  Service: General;  Laterality: Left;  . TUBAL LIGATION     Allergies  Allergen Reactions  . Fish Allergy Anaphylaxis, Shortness Of Breath and Rash  . Shrimp [Shellfish Allergy] Anaphylaxis, Shortness Of Breath and Rash  . Asa [Aspirin] Other (See Comments)    Abdominal swelling   . Penicillins Hives, Itching and Rash    Has patient had a PCN reaction causing immediate rash, facial/tongue/throat swelling, SOB or lightheadedness with hypotension: Yes Has patient had a PCN reaction causing severe rash involving mucus membranes or skin necrosis: No Has patient had a PCN reaction that required hospitalization: Yes Has patient had a PCN reaction occurring within the last 10 years: Yes If all of the above answers are "NO", then may proceed with Cephalosporin use.    No current facility-administered medications on file prior to encounter.    Current Outpatient Medications on File Prior to Encounter  Medication Sig Dispense Refill  . atorvastatin (LIPITOR) 10 MG tablet Take 10 mg by mouth daily.    Marland Kitchen HYDROcodone-acetaminophen (NORCO/VICODIN) 5-325 MG tablet One tablet every four hours for pain. 30 tablet 0  . lisinopril (ZESTRIL) 2.5 MG tablet Take 2.5 mg by mouth daily.    . metFORMIN (GLUCOPHAGE XR) 500 MG 24 hr tablet Take 1 tablet (500 mg total) by mouth daily with breakfast. 30 tablet 3  . naproxen (NAPROSYN) 500 MG tablet Take 1 tablet (500 mg total) by mouth 2 (two) times daily with a meal. 60 tablet 5  . predniSONE (DELTASONE) 10 MG tablet Take 2 tablets (20 mg total) by mouth daily. 15 tablet 0   Social History   Socioeconomic History  . Marital status: Married    Spouse name: Not on file  . Number of children: Not on file  . Years of education: Not on file  . Highest education level: Not on file  Occupational History  . Not on file  Tobacco Use  . Smoking status: Never Smoker  . Smokeless tobacco: Never Used  Vaping Use  . Vaping Use: Never used  Substance and Sexual Activity  . Alcohol use: No  . Drug use: No  . Sexual activity: Not Currently    Partners: Male    Birth control/protection: Surgical    Comment: tubal  Other Topics Concern  . Not on file  Social History Narrative   Married for  10 years, 3 children, all grown/teenagers.    2 daughters, 1 son.   Attends church.    Walks for exercise.   Enjoys singing.   Eats all food groups.   Wears seatbelt.   Born in [place], grew up in [place]. Father in [employer]. Grandmother raised her.    Moved from [place] in 2018.    Does not work out side of home.    Social Determinants of Health   Financial Resource Strain:   . Difficulty of Paying Living Expenses: Not on file  Food Insecurity:   . Worried About Programme researcher, broadcasting/film/video in the Last Year: Not on file  . Ran Out of Food in the Last Year: Not on file  Transportation Needs:   . Lack of  Transportation (Medical): Not on file  . Lack of Transportation (Non-Medical): Not on file  Physical Activity:   . Days of Exercise per Week: Not on file  . Minutes of Exercise per Session: Not on file  Stress:   . Feeling of Stress : Not on file  Social Connections:   . Frequency of Communication with Friends and Family: Not on file  . Frequency of Social Gatherings with Friends and Family: Not on file  . Attends Religious Services: Not on file  . Active Member of Clubs or Organizations: Not on file  . Attends Banker Meetings: Not on file  . Marital Status: Not on file  Intimate Partner Violence:   . Fear of Current or Ex-Partner: Not on file  . Emotionally Abused: Not on file  . Physically Abused: Not on file  . Sexually Abused: Not on file   Family History  Problem Relation Age of Onset  . Diabetes Mother   . Hypothyroidism Mother   . Prostate cancer Father   . Breast cancer Sister   . Stroke Brother     OBJECTIVE:  Vitals:   11/06/20 1329  BP: 137/86  Pulse: 71  Resp: 20  Temp: 97.9 F (36.6 C)  SpO2: 97%     General appearance: alert; mildly fatigued appearing, nontoxic; speaking in full sentences and tolerating own secretions HEENT: NCAT; Ears: RT EAC clear, TM pearly gray, LT EAC swollen, TM not visualized; Eyes: PERRL.  EOM grossly intact.Nose: nares patent without rhinorrhea, Throat: oropharynx clear, tonsils non erythematous or enlarged, uvula midline  Neck: supple without LAD Lungs: unlabored respirations, symmetrical air entry; cough: absent; no respiratory distress; CTAB Heart: regular rate and rhythm.  Skin: warm and dry Psychological: alert and cooperative; normal mood and affect   ASSESSMENT & PLAN:  1. Acute diffuse otitis externa of left ear   2. Left ear pain     Meds ordered this encounter  Medications  . ciprofloxacin-dexamethasone (CIPRODEX) OTIC suspension    Sig: Place 4 drops into the left ear 2 (two) times daily for 7  days.    Dispense:  7.5 mL    Refill:  0    Order Specific Question:   Supervising Provider    Answer:   Eustace Moore [[id number]]  . azithromycin (ZITHROMAX) 250 MG tablet    Sig: Take 1 tablet (250 mg total) by mouth daily. Take first 2 tablets together, then 1 every day until finished.    Dispense:  6 tablet    Refill:  0    Order Specific Question:   Supervising Provider    Answer:   Eustace Moore [[id number]]    Rest and drink plenty of fluids  Prescribed zpak for possible inner ear infection, due to pencillin allergy Prescribed ciprofloxacin ear drops Take medications as directed and to completion Continue to use OTC ibuprofen and/ or tylenol as needed for pain control Follow up with PCP if symptoms persists Return here or go to the ER if you have any new or worsening symptoms fever, chills, redness, swelling, discharge, etc...  Reviewed expectations re: course of current medical issues. Questions answered. Outlined signs and symptoms indicating need for more acute intervention. Patient verbalized understanding. After Visit Summary given.         Rennis Harding, PA-C 11/06/20 1412

## 2020-11-06 NOTE — Discharge Instructions (Signed)
Rest and drink plenty of fluids Prescribed zpak for possible inner ear infection, due to pencillin Prescribed ciprofloxacin ear drops Take medications as directed and to completion Continue to use OTC ibuprofen and/ or tylenol as needed for pain control Follow up with PCP if symptoms persists Return here or go to the ER if you have any new or worsening symptoms fever, chills, redness, swelling, discharge, etc..Marland Kitchen

## 2020-11-06 NOTE — ED Triage Notes (Signed)
Pt presents with c/o left ear pain that began yesterday, states it feels swollen and is difficult to hear

## 2020-11-07 ENCOUNTER — Telehealth: Payer: Self-pay | Admitting: Emergency Medicine

## 2020-11-07 MED ORDER — OFLOXACIN 0.3 % OT SOLN: 10.0000 [drp] | Freq: Every day | OTIC | 0 refills | Status: AC

## 2020-11-07 NOTE — Telephone Encounter (Signed)
Insurance does not pay for original ear drops; ofloxacin ear drops sent into pharmacy

## 2020-11-14 DIAGNOSIS — E079 Disorder of thyroid, unspecified: Secondary | ICD-10-CM | POA: Diagnosis not present

## 2020-11-14 DIAGNOSIS — E119 Type 2 diabetes mellitus without complications: Secondary | ICD-10-CM | POA: Diagnosis not present

## 2020-12-14 DIAGNOSIS — I10 Essential (primary) hypertension: Secondary | ICD-10-CM | POA: Diagnosis not present

## 2020-12-14 DIAGNOSIS — E119 Type 2 diabetes mellitus without complications: Secondary | ICD-10-CM | POA: Diagnosis not present

## 2021-01-14 DIAGNOSIS — E119 Type 2 diabetes mellitus without complications: Secondary | ICD-10-CM | POA: Diagnosis not present

## 2021-01-14 DIAGNOSIS — I10 Essential (primary) hypertension: Secondary | ICD-10-CM | POA: Diagnosis not present

## 2021-01-15 ENCOUNTER — Encounter: Payer: Self-pay | Admitting: Emergency Medicine

## 2021-01-15 ENCOUNTER — Ambulatory Visit
Admission: EM | Admit: 2021-01-15 | Discharge: 2021-01-15 | Disposition: A | Discharge status: Home / Self Care | Payer: Medicare Other | Attending: Family Medicine | Admitting: Family Medicine

## 2021-01-15 ENCOUNTER — Other Ambulatory Visit: Payer: Self-pay

## 2021-01-15 DIAGNOSIS — J029 Acute pharyngitis, unspecified: Secondary | ICD-10-CM | POA: Diagnosis not present

## 2021-01-15 DIAGNOSIS — J069 Acute upper respiratory infection, unspecified: Secondary | ICD-10-CM | POA: Diagnosis not present

## 2021-01-15 DIAGNOSIS — Z20822 Contact with and (suspected) exposure to covid-19: Secondary | ICD-10-CM | POA: Diagnosis not present

## 2021-01-15 MED ORDER — LOPERAMIDE HCL 2 MG PO CAPS: 2.0000 mg | ORAL_CAPSULE | Freq: Four times a day (QID) | ORAL | 0 refills | Status: DC | PRN

## 2021-01-15 MED ORDER — PROMETHAZINE-DM 6.25-15 MG/5ML PO SYRP: 5.0000 mL | ORAL_SOLUTION | Freq: Four times a day (QID) | ORAL | 0 refills | Status: DC | PRN

## 2021-01-15 NOTE — ED Provider Notes (Signed)
[facility]ENTER   [id number] 01/15/21 Arrival Time: 1246   CC: COVID symptoms  SUBJECTIVE: History from: patient.  FRANCIE KEELING is a 50 y.o. female who presents with sore throat, body aches, headache, diarrhea x 2 days. Daughter at home has Covid. Denies sick exposure to COVID, flu or strep. Denies recent travel. Has negative history of Covid. Has not completed Covid vaccines. Has not taken OTC medications for this. There are no aggravating or alleviating factors. Denies previous symptoms in the past. Denies sinus pain, rhinorrhea,  SOB, wheezing, chest pain, nausea, changes in bladder habits.    ROS: As per HPI.  All other pertinent ROS negative.     Past Medical History:  Diagnosis Date  . Diabetes mellitus without complication (HCC)   . Hyperlipidemia   . Hypertension    Past Surgical History:  Procedure Laterality Date  . CHOLECYSTECTOMY    . EXCISION OF BREAST BIOPSY Left 05/29/2018   Procedure: NIPPLE EXPLORATION/EXCISION RETROAREOLA TISSUE LEFT BREAST;  Surgeon: Lucretia Roers, MD;  Location: [facility];  Service: General;  Laterality: Left;  . TUBAL LIGATION     Allergies  Allergen Reactions  . Fish Allergy Anaphylaxis, Shortness Of Breath and Rash  . Shrimp [Shellfish Allergy] Anaphylaxis, Shortness Of Breath and Rash  . Asa [Aspirin] Other (See Comments)    Abdominal swelling   . Penicillins Hives, Itching and Rash    Has patient had a PCN reaction causing immediate rash, facial/tongue/throat swelling, SOB or lightheadedness with hypotension: Yes Has patient had a PCN reaction causing severe rash involving mucus membranes or skin necrosis: No Has patient had a PCN reaction that required hospitalization: Yes Has patient had a PCN reaction occurring within the last 10 years: Yes If all of the above answers are "NO", then may proceed with Cephalosporin use.    No current facility-administered medications on file prior to encounter.   Current Outpatient  Medications on File Prior to Encounter  Medication Sig Dispense Refill  . atorvastatin (LIPITOR) 10 MG tablet Take 10 mg by mouth daily.    Marland Kitchen azithromycin (ZITHROMAX) 250 MG tablet Take 1 tablet (250 mg total) by mouth daily. Take first 2 tablets together, then 1 every day until finished. 6 tablet 0  . HYDROcodone-acetaminophen (NORCO/VICODIN) 5-325 MG tablet One tablet every four hours for pain. 30 tablet 0  . lisinopril (ZESTRIL) 2.5 MG tablet Take 2.5 mg by mouth daily.    . metFORMIN (GLUCOPHAGE XR) 500 MG 24 hr tablet Take 1 tablet (500 mg total) by mouth daily with breakfast. 30 tablet 3  . naproxen (NAPROSYN) 500 MG tablet Take 1 tablet (500 mg total) by mouth 2 (two) times daily with a meal. 60 tablet 5  . predniSONE (DELTASONE) 10 MG tablet Take 2 tablets (20 mg total) by mouth daily. 15 tablet 0   Social History   Socioeconomic History  . Marital status: Married    Spouse name: Not on file  . Number of children: Not on file  . Years of education: Not on file  . Highest education level: Not on file  Occupational History  . Not on file  Tobacco Use  . Smoking status: Never Smoker  . Smokeless tobacco: Never Used  Vaping Use  . Vaping Use: Never used  Substance and Sexual Activity  . Alcohol use: No  . Drug use: No  . Sexual activity: Not Currently    Partners: Male    Birth control/protection: Surgical    Comment: tubal  Other Topics Concern  . Not on file  Social History Narrative   Married for 10 years, 3 children, all grown/teenagers.    2 daughters, 1 son.   Attends church.    Walks for exercise.   Enjoys singing.   Eats all food groups.   Wears seatbelt.   Born in [place], grew up in [place]. Father in [employer]. Grandmother raised her.    Moved from [place] in 2018.    Does not work out side of home.    Social Determinants of Health   Financial Resource Strain: Not on file  Food Insecurity: Not on file  Transportation Needs: Not on file  Physical  Activity: Not on file  Stress: Not on file  Social Connections: Not on file  Intimate Partner Violence: Not on file   Family History  Problem Relation Age of Onset  . Diabetes Mother   . Hypothyroidism Mother   . Prostate cancer Father   . Breast cancer Sister   . Stroke Brother     OBJECTIVE:  Vitals:   01/15/21 1324  BP: (!) 174/97  Pulse: 86  Resp: 16  Temp: 97.6 F (36.4 C)  TempSrc: Tympanic  SpO2: 96%     General appearance: alert; appears fatigued, but nontoxic; speaking in full sentences and tolerating own secretions HEENT: NCAT; Ears: EACs clear, TMs pearly gray; Eyes: PERRL.  EOM grossly intact. Sinuses: nontender; Nose: nares patent with clear rhinorrhea, Throat: oropharynx erythematous, cobblestoning present, tonsils non erythematous or enlarged, uvula midline  Neck: supple without LAD Lungs: unlabored respirations, symmetrical air entry; cough: absent; no respiratory distress; CTAB Heart: regular rate and rhythm.  Radial pulses 2+ symmetrical bilaterally Skin: warm and dry Psychological: alert and cooperative; normal mood and affect  LABS:  No results found for this or any previous visit (from the past 24 hour(s)).   ASSESSMENT & PLAN:  1. Viral URI with cough   2. Exposure to COVID-19 virus   3. Sore throat     Meds ordered this encounter  Medications  . promethazine-dextromethorphan (PROMETHAZINE-DM) 6.25-15 MG/5ML syrup    Sig: Take 5 mLs by mouth 4 (four) times daily as needed.    Dispense:  118 mL    Refill:  0    Order Specific Question:   Supervising Provider    Answer:   Merrilee Jansky [id number]  . loperamide (IMODIUM) 2 MG capsule    Sig: Take 1 capsule (2 mg total) by mouth 4 (four) times daily as needed for diarrhea or loose stools.    Dispense:  12 capsule    Refill:  0    Order Specific Question:   Supervising Provider    Answer:   Merrilee Jansky [id number]   Prescribed imodium Promethazine cough syrup prescribed Sedation  precautions given Continue supportive care at home COVID and flu testing ordered.  It will take between 2-3 days for test results. Someone will contact you regarding abnormal results.   Patient should remain in quarantine until they have received Covid results.  If negative you may resume normal activities (go back to work/school) while practicing hand hygiene, social distance, and mask wearing.  If positive, patient should remain in quarantine for at least 5 days from symptom onset AND greater than 72 hours after symptoms resolution (absence of fever without the use of fever-reducing medication and improvement in respiratory symptoms), whichever is longer Get plenty of rest and push fluids Use OTC zyrtec for nasal congestion, runny nose, and/or sore throat  Use OTC flonase for nasal congestion and runny nose Use medications daily for symptom relief Use OTC medications like ibuprofen or tylenol as needed fever or pain Call or go to the ED if you have any new or worsening symptoms such as fever, worsening cough, shortness of breath, chest tightness, chest pain, turning blue, changes in mental status.  Reviewed expectations re: course of current medical issues. Questions answered. Outlined signs and symptoms indicating need for more acute intervention. Patient verbalized understanding. After Visit Summary given.         Moshe Cipro, NP 01/15/21 1540

## 2021-01-15 NOTE — ED Triage Notes (Signed)
Sore throat, body aches, headache, since saturday

## 2021-01-15 NOTE — Discharge Instructions (Addendum)
I have sent in imodium for you to take as needed for diarrhea  I have sent in cough syrup for you to take. This medication can make you sleepy. Do not drive while taking this medication.  Your COVID test is pending.  You should self quarantine until the test result is back.    Take Tylenol or ibuprofen as needed for fever or discomfort.  Rest and keep yourself hydrated.    Follow-up with your primary care provider if your symptoms are not improving.

## 2021-01-17 LAB — COVID-19, FLU A+B NAA
Influenza A, NAA: NOT DETECTED
Influenza B, NAA: NOT DETECTED
SARS-CoV-2, NAA: DETECTED — AB

## 2021-01-25 DIAGNOSIS — E119 Type 2 diabetes mellitus without complications: Secondary | ICD-10-CM | POA: Diagnosis not present

## 2021-01-25 DIAGNOSIS — I10 Essential (primary) hypertension: Secondary | ICD-10-CM | POA: Diagnosis not present

## 2021-02-12 DIAGNOSIS — I10 Essential (primary) hypertension: Secondary | ICD-10-CM | POA: Diagnosis not present

## 2021-02-12 DIAGNOSIS — E119 Type 2 diabetes mellitus without complications: Secondary | ICD-10-CM | POA: Diagnosis not present

## 2021-02-19 DIAGNOSIS — E119 Type 2 diabetes mellitus without complications: Secondary | ICD-10-CM | POA: Diagnosis not present

## 2021-02-19 DIAGNOSIS — E039 Hypothyroidism, unspecified: Secondary | ICD-10-CM | POA: Diagnosis not present

## 2021-02-19 DIAGNOSIS — E1165 Type 2 diabetes mellitus with hyperglycemia: Secondary | ICD-10-CM | POA: Diagnosis not present

## 2021-02-19 DIAGNOSIS — I10 Essential (primary) hypertension: Secondary | ICD-10-CM | POA: Diagnosis not present

## 2021-02-19 DIAGNOSIS — Z0001 Encounter for general adult medical examination with abnormal findings: Secondary | ICD-10-CM | POA: Diagnosis not present

## 2021-06-19 ENCOUNTER — Other Ambulatory Visit (HOSPITAL_COMMUNITY): Payer: Self-pay | Admitting: Gerontology

## 2021-06-19 DIAGNOSIS — I10 Essential (primary) hypertension: Secondary | ICD-10-CM | POA: Diagnosis not present

## 2021-06-19 DIAGNOSIS — E119 Type 2 diabetes mellitus without complications: Secondary | ICD-10-CM | POA: Diagnosis not present

## 2021-06-19 DIAGNOSIS — K529 Noninfective gastroenteritis and colitis, unspecified: Secondary | ICD-10-CM | POA: Diagnosis not present

## 2021-06-19 DIAGNOSIS — M79661 Pain in right lower leg: Secondary | ICD-10-CM

## 2021-07-04 ENCOUNTER — Encounter: Payer: Self-pay | Admitting: Internal Medicine

## 2021-11-03 ENCOUNTER — Encounter: Payer: Self-pay | Admitting: Gastroenterology

## 2021-11-03 NOTE — Progress Notes (Deleted)
Referring Provider:Fanta, Ninetta Lights, MD Primary Care Physician:  Avon Gully, MD Primary Gastroenterologist:  Dr. Bonnetta Barry chief complaint on file.   HPI:   Paula Butler is a 50 y.o. female presenting today at the request of Avon Gully, MD for consult colonoscopy.  Office visit due to BMI.   Past Medical History:  Diagnosis Date   Diabetes mellitus without complication (HCC)    Hyperlipidemia    Hypertension     Past Surgical History:  Procedure Laterality Date   CHOLECYSTECTOMY     EXCISION OF BREAST BIOPSY Left 05/29/2018   Procedure: NIPPLE EXPLORATION/EXCISION RETROAREOLA TISSUE LEFT BREAST;  Surgeon: Lucretia Roers, MD;  Location: [facility];  Service: General;  Laterality: Left;   TUBAL LIGATION      Current Outpatient Medications  Medication Sig Dispense Refill   atorvastatin (LIPITOR) 10 MG tablet Take 10 mg by mouth daily.     azithromycin (ZITHROMAX) 250 MG tablet Take 1 tablet (250 mg total) by mouth daily. Take first 2 tablets together, then 1 every day until finished. 6 tablet 0   HYDROcodone-acetaminophen (NORCO/VICODIN) 5-325 MG tablet One tablet every four hours for pain. 30 tablet 0   lisinopril (ZESTRIL) 2.5 MG tablet Take 2.5 mg by mouth daily.     loperamide (IMODIUM) 2 MG capsule Take 1 capsule (2 mg total) by mouth 4 (four) times daily as needed for diarrhea or loose stools. 12 capsule 0   metFORMIN (GLUCOPHAGE XR) 500 MG 24 hr tablet Take 1 tablet (500 mg total) by mouth daily with breakfast. 30 tablet 3   naproxen (NAPROSYN) 500 MG tablet Take 1 tablet (500 mg total) by mouth 2 (two) times daily with a meal. 60 tablet 5   predniSONE (DELTASONE) 10 MG tablet Take 2 tablets (20 mg total) by mouth daily. 15 tablet 0   promethazine-dextromethorphan (PROMETHAZINE-DM) 6.25-15 MG/5ML syrup Take 5 mLs by mouth 4 (four) times daily as needed. 118 mL 0   No current facility-administered medications for this visit.    Allergies as of 11/05/2021 -  Review Complete 01/15/2021  Allergen Reaction Noted   Fish allergy Anaphylaxis, Shortness Of Breath, and Rash 05/19/2018   Shrimp [shellfish allergy] Anaphylaxis, Shortness Of Breath, and Rash 05/19/2018   Asa [aspirin] Other (See Comments) 07/21/2017   Penicillins Hives, Itching, and Rash 07/21/2017    Family History  Problem Relation Age of Onset   Diabetes Mother    Hypothyroidism Mother    Prostate cancer Father    Breast cancer Sister    Stroke Brother     Social History   Socioeconomic History   Marital status: Married    Spouse name: Not on file   Number of children: Not on file   Years of education: Not on file   Highest education level: Not on file  Occupational History   Not on file  Tobacco Use   Smoking status: Never   Smokeless tobacco: Never  Vaping Use   Vaping Use: Never used  Substance and Sexual Activity   Alcohol use: No   Drug use: No   Sexual activity: Not Currently    Partners: Male    Birth control/protection: Surgical    Comment: tubal  Other Topics Concern   Not on file  Social History Narrative   Married for 10 years, 3 children, all grown/teenagers.    2 daughters, 1 son.   Attends church.    Walks for exercise.   Enjoys singing.   Eats  all food groups.   Wears seatbelt.   Born in [place], grew up in [place]. Father in [employer]. Grandmother raised her.    Moved from [place] in 2018.    Does not work out side of home.    Social Determinants of Health   Financial Resource Strain: Not on file  Food Insecurity: Not on file  Transportation Needs: Not on file  Physical Activity: Not on file  Stress: Not on file  Social Connections: Not on file  Intimate Partner Violence: Not on file    Review of Systems: Gen: Denies any fever, chills, fatigue, weight loss, lack of appetite.  CV: Denies chest pain, heart palpitations, peripheral edema, syncope.  Resp: Denies shortness of breath at rest or with exertion. Denies wheezing or cough.   GI: Denies dysphagia or odynophagia. Denies jaundice, hematemesis, fecal incontinence. GU : Denies urinary burning, urinary frequency, urinary hesitancy MS: Denies joint pain, muscle weakness, cramps, or limitation of movement.  Derm: Denies rash, itching, dry skin Psych: Denies depression, anxiety, memory loss, and confusion Heme: Denies bruising, bleeding, and enlarged lymph nodes.  Physical Exam: There were no vitals taken for this visit. General:   Alert and oriented. Pleasant and cooperative. Well-nourished and well-developed.  Head:  Normocephalic and atraumatic. Eyes:  Without icterus, sclera clear and conjunctiva pink.  Ears:  Normal auditory acuity. Nose:  No deformity, discharge,  or lesions. Mouth:  No deformity or lesions, oral mucosa pink.  Neck:  Supple, without mass or thyromegaly. Lungs:  Clear to auscultation bilaterally. No wheezes, rales, or rhonchi. No distress.  Heart:  S1, S2 present without murmurs appreciated.  Abdomen:  +BS, soft, non-tender and non-distended. No HSM noted. No guarding or rebound. No masses appreciated.  Rectal:  Deferred  Msk:  Symmetrical without gross deformities. Normal posture. Pulses:  Normal pulses noted. Extremities:  Without clubbing or edema. Neurologic:  Alert and  oriented x4;  grossly normal neurologically. Skin:  Intact without significant lesions or rashes. Cervical Nodes:  No significant cervical adenopathy. Psych:  Alert and cooperative. Normal mood and affect.

## 2021-11-05 ENCOUNTER — Ambulatory Visit: Payer: Medicare Other | Admitting: Gastroenterology

## 2021-11-05 ENCOUNTER — Encounter: Payer: Self-pay | Admitting: Internal Medicine

## 2022-03-04 DIAGNOSIS — R1011 Right upper quadrant pain: Secondary | ICD-10-CM | POA: Diagnosis not present

## 2022-03-04 DIAGNOSIS — R079 Chest pain, unspecified: Secondary | ICD-10-CM | POA: Diagnosis not present

## 2022-03-04 DIAGNOSIS — I10 Essential (primary) hypertension: Secondary | ICD-10-CM | POA: Diagnosis not present

## 2022-03-04 DIAGNOSIS — E119 Type 2 diabetes mellitus without complications: Secondary | ICD-10-CM | POA: Diagnosis not present

## 2022-03-04 DIAGNOSIS — R109 Unspecified abdominal pain: Secondary | ICD-10-CM | POA: Diagnosis not present

## 2022-04-15 ENCOUNTER — Emergency Department (HOSPITAL_COMMUNITY): Payer: Medicare Other

## 2022-04-15 ENCOUNTER — Encounter (HOSPITAL_COMMUNITY): Payer: Self-pay | Admitting: *Deleted

## 2022-04-15 ENCOUNTER — Other Ambulatory Visit: Payer: Self-pay

## 2022-04-15 ENCOUNTER — Emergency Department (HOSPITAL_COMMUNITY)
Admission: EM | Admit: 2022-04-15 | Discharge: 2022-04-15 | Disposition: A | Discharge status: Home / Self Care | Payer: Medicare Other | Attending: Emergency Medicine | Admitting: Emergency Medicine

## 2022-04-15 DIAGNOSIS — Z79899 Other long term (current) drug therapy: Secondary | ICD-10-CM | POA: Insufficient documentation

## 2022-04-15 DIAGNOSIS — Z7984 Long term (current) use of oral hypoglycemic drugs: Secondary | ICD-10-CM | POA: Insufficient documentation

## 2022-04-15 DIAGNOSIS — E119 Type 2 diabetes mellitus without complications: Secondary | ICD-10-CM | POA: Insufficient documentation

## 2022-04-15 DIAGNOSIS — I10 Essential (primary) hypertension: Secondary | ICD-10-CM | POA: Insufficient documentation

## 2022-04-15 DIAGNOSIS — X501XXA Overexertion from prolonged static or awkward postures, initial encounter: Secondary | ICD-10-CM | POA: Diagnosis not present

## 2022-04-15 DIAGNOSIS — M25562 Pain in left knee: Secondary | ICD-10-CM | POA: Insufficient documentation

## 2022-04-15 DIAGNOSIS — M25561 Pain in right knee: Secondary | ICD-10-CM | POA: Diagnosis not present

## 2022-04-15 MED ORDER — NAPROXEN 500 MG PO TABS: 500.0000 mg | ORAL_TABLET | Freq: Two times a day (BID) | ORAL | 0 refills | Status: AC

## 2022-04-15 MED ORDER — HYDROCODONE-ACETAMINOPHEN 5-325 MG PO TABS
1.0000 | ORAL_TABLET | Freq: Once | ORAL | Status: AC
  Administered 2022-04-15: 1 via ORAL
  Filled 2022-04-15: qty 1

## 2022-04-15 NOTE — ED Provider Notes (Signed)
?[facility] ?Provider Note ? ? ?CSN: [id number] ?Arrival date & time: 04/15/22  1458 ? ?  ? ?History ? ?Chief Complaint  ?Patient presents with  ? Knee Pain  ? ? ?Paula Butler is a 51 y.o. female with chief complaint of left knee pain.  Patient was moving around when she felt a pop sensation in her left knee.  States it is now difficult to bear weight on that leg due to pain.  Denies numbness and tingling of the left extremity.  Denies color change such as rash or bruising to the skin.  Is still able to bend the knee, but has difficulty straightening it all the way.  No prior surgeries/treatments on this knee.  No recent fever or infection.  No history of rheumatoid arthritis, gout, or pseudogout.  Without any other complaints. ? ?The history is provided by the patient and medical records.  ?Knee Pain ? ?  ? ?Home Medications ?Prior to Admission medications   ?Medication Sig Start Date End Date Taking? Authorizing Provider  ?naproxen (NAPROSYN) 500 MG tablet Take 1 tablet (500 mg total) by mouth 2 (two) times daily for 7 days. 04/15/22 04/22/22 Yes Cecil Cobbsockerham, Dilpreet M, PA-C  ?atorvastatin (LIPITOR) 10 MG tablet Take 10 mg by mouth daily.    [provider]  ?azithromycin (ZITHROMAX) 250 MG tablet Take 1 tablet (250 mg total) by mouth daily. Take first 2 tablets together, then 1 every day until finished. 11/06/20   Wurst, GrenadaBrittany, PA-C  ?HYDROcodone-acetaminophen (NORCO/VICODIN) 5-325 MG tablet One tablet every four hours for pain. 03/21/20   Darreld McleanKeeling, Wayne, MD  ?lisinopril (ZESTRIL) 2.5 MG tablet Take 2.5 mg by mouth daily.    [provider]  ?loperamide (IMODIUM) 2 MG capsule Take 1 capsule (2 mg total) by mouth 4 (four) times daily as needed for diarrhea or loose stools. 01/15/21   Moshe CiproMatthews, Stephanie, NP  ?metFORMIN (GLUCOPHAGE XR) 500 MG 24 hr tablet Take 1 tablet (500 mg total) by mouth daily with breakfast. 04/26/19   Nida, Denman GeorgeGebreselassie W, MD  ?predniSONE (DELTASONE) 10 MG  tablet Take 2 tablets (20 mg total) by mouth daily. 08/31/20   Durward ParcelAvegno, Komlanvi S, FNP  ?promethazine-dextromethorphan (PROMETHAZINE-DM) 6.25-15 MG/5ML syrup Take 5 mLs by mouth 4 (four) times daily as needed. 01/15/21   Moshe CiproMatthews, Stephanie, NP  ?   ? ?Allergies    ?Fish allergy, Shrimp [shellfish allergy], Asa [aspirin], and Penicillins   ? ?Review of Systems   ?Review of Systems  ?Musculoskeletal:   ?     Left knee pain  ? ?Physical Exam ?Updated Vital Signs ?BP 125/89   Pulse 73   Temp (!) 97.5 ?F (36.4 ?C) (Oral)   Resp 18   Ht 5\' 5"  (1.651 m)   Wt 124.7 kg   SpO2 95%   BMI 45.76 kg/m?  ?Physical Exam ?Vitals and nursing note reviewed.  ?Constitutional:   ?   General: She is not in acute distress. ?   Appearance: She is well-developed. She is obese. She is not ill-appearing or diaphoretic.  ?HENT:  ?   Head: Normocephalic and atraumatic.  ?Eyes:  ?   Conjunctiva/sclera: Conjunctivae normal.  ?Cardiovascular:  ?   Rate and Rhythm: Normal rate and regular rhythm.  ?   Pulses:     ?     Dorsalis pedis pulses are 2+ on the right side and 2+ on the left side.  ?     Posterior tibial pulses are 2+ on the  right side and 2+ on the left side.  ?   Heart sounds: No murmur heard. ?Pulmonary:  ?   Effort: Pulmonary effort is normal. No respiratory distress.  ?   Breath sounds: Normal breath sounds.  ?Abdominal:  ?   Palpations: Abdomen is soft.  ?   Tenderness: There is no abdominal tenderness.  ?Musculoskeletal:     ?   General: Tenderness and signs of injury present. No swelling.  ?   Cervical back: Neck supple.  ?   Right lower leg: No edema.  ?   Left lower leg: No edema.  ?     Legs: ? ?   Comments: Tenderness of the left knee as depicted above.  Able to flex left knee to about 70 degrees on the right knee to about 100 degrees.  Able to extend left knee to about 20 degrees and the right knee to 0 degrees.  Limited range of motion due to tenderness.  Tenderness of left knee with varus but not valgus test.   Tenderness on the lateral superior and inferior joint lines.  Also tenderness directly inferior to the kneecap.  Mild edema noted.  No ecchymosis, rash, laceration, open wound, malalignment, or obvious mass appreciated.  ?Skin: ?   General: Skin is warm and dry.  ?   Capillary Refill: Capillary refill takes less than 2 seconds.  ?Neurological:  ?   Mental Status: She is alert and oriented to person, place, and time.  ?Psychiatric:     ?   Mood and Affect: Mood normal.  ? ? ?ED Results / Procedures / Treatments   ?Labs ?(all labs ordered are listed, but only abnormal results are displayed) ?Labs Reviewed - No data to display ? ?EKG ?None ? ?Radiology ?DG Knee Complete 4 Views Left ? ?Result Date: 04/15/2022 ?CLINICAL DATA:  Left knee injury EXAM: LEFT KNEE - COMPLETE 4+ VIEW COMPARISON:  None. FINDINGS: No acute fracture or dislocation identified. Mild tricompartmental joint space narrowing with early marginal osteophytes. No significant joint effusion visualized. IMPRESSION: No acute osseous abnormality visualized. Electronically Signed   By: Jannifer Hick M.D.   On: 04/15/2022 16:04   ? ?Procedures ?Procedures  ? ? ?Medications Ordered in ED ?Medications  ?HYDROcodone-acetaminophen (NORCO/VICODIN) 5-325 MG per tablet 1 tablet (1 tablet Oral Given 04/15/22 1928)  ? ? ?ED Course/ Medical Decision Making/ A&P ?  ?                        ?Medical Decision Making ?Amount and/or Complexity of Data Reviewed ?External Data Reviewed: notes. ?Labs:  Decision-making details documented in ED Course. ?Radiology: ordered and independent interpretation performed. Decision-making details documented in ED Course. ?ECG/medicine tests:  Decision-making details documented in ED Course. ? ?Risk ?OTC drugs. ?Prescription drug management. ? ? ?51 y.o. female presents to the ED for concern of Knee Pain ?This involves an extensive number of treatment options, and is a complaint that carries with it a high risk of complications and  morbidity.  The emergent differential diagnosis prior to evaluation includes, but is not limited to: Fracture, dislocation, sprain, septic arthritis, gout, rheumatoid arthritis, contusion ? ?This is not an exhaustive differential.  ? ?Past Medical History / Co-morbidities / Social History: ?Hepatic steatosis, diabetes mellitus type 2, hyperlipidemia, hypertension, prior cholecystectomy ? ?Additional History:  ?Internal and external records from outside source obtained and reviewed including ED visits, primary care, urgent care ? ?Physical Exam: ?Physical exam performed. The pertinent findings  include: Unable to fully flex or extend the left knee due to elicited pain.  Joint without warmth, erythema, or significant swelling.  Afebrile. ? ?Lab Tests: ?None ? ?Imaging Studies: ?I ordered imaging studies including x-ray left knee.  I independently visualized and interpreted said imaging.  Pertinent results include: ?No acute bony pathology ?I agree with the radiologist interpretation. ? ?Medications: ?I ordered medication including Norco for pain relief.  Reevaluation of the patient after these medicines showed that the patient tolerated this well and has moderate relief.  I have reviewed the patients home medicines and have made adjustments as needed ? ?ED Course/Disposition: ?Pt well-appearing on exam.  Acute knee pain with "pop" sensation.  Unable to fully bear weight due to pain within the knee joint, worst on the lateral aspect.  Patient X-Ray negative for obvious fracture or dislocation.  Pain managed in ED.  No evidence of ischemia, cellulitis, or vascular compromise.  Not suspicious of septic arthritis, gout, pseudogout, rheumatoid arthritis.  Contusion versus meniscus versus ligamentous injury.  Pt advised to follow up with orthopedics for reevaluation and continued treatment.  Pt given brace and crutches while in ED.  Conservative therapy recommended and discussed.  ? ?After consideration of the diagnostic  results and the patient's encounter today, I feel that the emergency department workup does not suggest an emergent condition requiring admission or immediate intervention beyond what has been performed at this time.  The pat

## 2022-04-15 NOTE — ED Triage Notes (Signed)
Pt states she tried to get up and felt a "pop" in her left knee and is now having a burning sensation ?

## 2022-04-15 NOTE — Discharge Instructions (Addendum)
X-rays were negative for fracture or dislocation.  You have been provided with the orthopedic office contact information to schedule an appointment with Dr. Romeo Apple.  Please call to schedule an appointment within the next 2 to 3 days. ? ?An anti-inflammatory medication has been sent to your pharmacy.  You may take 1 tablet every 12 hours as needed for pain.  Take this with plenty of water and food.  If you develop shortness of breath, rash, anaphylaxis, stop taking this medication immediately. ? ?Additional instructions on rice therapy has been provided for you as well in your discharge paperwork.  Please review these. ? ?Return to the ED for new or worsening symptoms as discussed. ?

## 2022-04-29 DIAGNOSIS — E119 Type 2 diabetes mellitus without complications: Secondary | ICD-10-CM | POA: Diagnosis not present

## 2022-04-29 DIAGNOSIS — L309 Dermatitis, unspecified: Secondary | ICD-10-CM | POA: Diagnosis not present

## 2022-04-29 DIAGNOSIS — R030 Elevated blood-pressure reading, without diagnosis of hypertension: Secondary | ICD-10-CM | POA: Diagnosis not present

## 2022-04-29 DIAGNOSIS — E785 Hyperlipidemia, unspecified: Secondary | ICD-10-CM | POA: Diagnosis not present

## 2022-04-29 DIAGNOSIS — I10 Essential (primary) hypertension: Secondary | ICD-10-CM | POA: Diagnosis not present

## 2022-06-04 ENCOUNTER — Ambulatory Visit (INDEPENDENT_AMBULATORY_CARE_PROVIDER_SITE_OTHER): Payer: Medicare Other | Admitting: Family Medicine

## 2022-06-04 ENCOUNTER — Encounter: Payer: Self-pay | Admitting: Family Medicine

## 2022-06-04 VITALS — BP 125/80 | HR 76 | Temp 98.8°F | Ht 65.0 in | Wt 283.0 lb

## 2022-06-04 DIAGNOSIS — R300 Dysuria: Secondary | ICD-10-CM

## 2022-06-04 DIAGNOSIS — Z7689 Persons encountering health services in other specified circumstances: Secondary | ICD-10-CM | POA: Diagnosis not present

## 2022-06-04 DIAGNOSIS — E785 Hyperlipidemia, unspecified: Secondary | ICD-10-CM | POA: Diagnosis not present

## 2022-06-04 DIAGNOSIS — K760 Fatty (change of) liver, not elsewhere classified: Secondary | ICD-10-CM

## 2022-06-04 DIAGNOSIS — E1169 Type 2 diabetes mellitus with other specified complication: Secondary | ICD-10-CM

## 2022-06-04 DIAGNOSIS — I152 Hypertension secondary to endocrine disorders: Secondary | ICD-10-CM | POA: Diagnosis not present

## 2022-06-04 DIAGNOSIS — E1159 Type 2 diabetes mellitus with other circulatory complications: Secondary | ICD-10-CM | POA: Diagnosis not present

## 2022-06-04 DIAGNOSIS — E119 Type 2 diabetes mellitus without complications: Secondary | ICD-10-CM | POA: Insufficient documentation

## 2022-06-04 LAB — URINALYSIS, ROUTINE W REFLEX MICROSCOPIC
Bilirubin, UA: NEGATIVE
Glucose, UA: NEGATIVE
Ketones, UA: NEGATIVE
Leukocytes,UA: NEGATIVE
Nitrite, UA: NEGATIVE
Protein,UA: NEGATIVE
Specific Gravity, UA: 1.02 (ref 1.005–1.030)
Urobilinogen, Ur: 0.2 mg/dL (ref 0.2–1.0)
pH, UA: 5.5 (ref 5.0–7.5)

## 2022-06-04 LAB — MICROSCOPIC EXAMINATION

## 2022-06-04 LAB — BAYER DCA HB A1C WAIVED: HB A1C (BAYER DCA - WAIVED): 5.9 % — ABNORMAL HIGH (ref 4.8–5.6)

## 2022-06-04 NOTE — Progress Notes (Signed)
Subjective:  Patient ID: Paula Butler, female    DOB: 08-22-1971, 51 y.o.   MRN: [id number]  Patient Care Team: Baruch Gouty, FNP as PCP - General (Family Medicine) Eloise Harman, DO as Consulting Physician (Gastroenterology)   Chief Complaint:  New Patient (Initial Visit)   HPI: Paula Butler is a 51 y.o. female presenting on 06/04/2022 for New Patient (Initial Visit)   Pt presents today to establish care with new PCP. She was last followed by Dr. Legrand Rams, last seen over 4 months ago. Prior medical records have been requested. She has a complex medical history including T2DM with associated HTN, HLD, and morbid obesity. She was previously on metformin, ACEi, and statin therapy. She tolerated medications well. She does not check her blood sugars or blood pressure at home. She complains of weight gain, polyuria, polyphagia, polydipsia, and slight dysuria. She also reports skin color changes to her back. She denies chest pain, abdominal pain, headaches, visual changes, heat or cold intolerance, weakness, or confusion. She has had several ED visits in the last few months for random complaints. Those workups have been unremarkable. She states her PAP and mammogram are up to date. She has not had a colonoscopy. Upon review of health records, pt has gained 8 lbs in last 30 days.      Relevant past medical, surgical, family, and social history reviewed and updated as indicated.  Allergies and medications reviewed and updated. Data reviewed: Chart in Epic.   Past Medical History:  Diagnosis Date   Diabetes mellitus without complication ([place])    Hyperlipidemia    Hypertension     Past Surgical History:  Procedure Laterality Date   CHOLECYSTECTOMY     ESOPHAGOGASTRODUODENOSCOPY  06/14/2009   Dr. Zeb Comfort; mild esophagitis biopsied, small gastric ulcer-healing s/p biopsied, gastritis biopsied, mild duodenitis biopsied.  Esophageal biopsy unremarkable, gastric and duodenal  biopsy with no significant histopathologic features.   EXCISION OF BREAST BIOPSY Left 05/29/2018   Procedure: NIPPLE EXPLORATION/EXCISION RETROAREOLA TISSUE LEFT BREAST;  Surgeon: Virl Cagey, MD;  Location: [facility];  Service: General;  Laterality: Left;   TUBAL LIGATION      Social History   Socioeconomic History   Marital status: Married    Spouse name: Not on file   Number of children: Not on file   Years of education: Not on file   Highest education level: Not on file  Occupational History   Not on file  Tobacco Use   Smoking status: Never   Smokeless tobacco: Never  Vaping Use   Vaping Use: Never used  Substance and Sexual Activity   Alcohol use: No   Drug use: No   Sexual activity: Not Currently    Partners: Male    Birth control/protection: Surgical    Comment: tubal  Other Topics Concern   Not on file  Social History Narrative   Married for 10 years, 3 children, all grown/teenagers.    2 daughters, 1 son.   Attends church.    Walks for exercise.   Enjoys singing.   Eats all food groups.   Wears seatbelt.   Born in [place], grew up in [place]. Father in [employer]. Grandmother raised her.    Moved from [place] in 2018.    Does not work out side of home.    Social Determinants of Health   Financial Resource Strain: Not on file  Food Insecurity: Not on file  Transportation Needs: Not on file  Physical  Activity: Not on file  Stress: Not on file  Social Connections: Socially Integrated (05/06/2018)   Social Connection and Isolation Panel [NHANES]    Frequency of Communication with Friends and Family: More than three times a week    Frequency of Social Gatherings with Friends and Family: More than three times a week    Attends Religious Services: More than 4 times per year    Active Member of [employer] or Organizations: Yes    Attends Music therapist: More than 4 times per year    Marital Status: Married  Human resources officer Violence: Not At Risk  (05/06/2018)   Humiliation, Afraid, Rape, and Kick questionnaire    Fear of Current or Ex-Partner: No    Emotionally Abused: No    Physically Abused: No    Sexually Abused: No    Outpatient Encounter Medications as of 06/04/2022  Medication Sig   [DISCONTINUED] atorvastatin (LIPITOR) 10 MG tablet Take 10 mg by mouth daily.   [DISCONTINUED] azithromycin (ZITHROMAX) 250 MG tablet Take 1 tablet (250 mg total) by mouth daily. Take first 2 tablets together, then 1 every day until finished.   [DISCONTINUED] HYDROcodone-acetaminophen (NORCO/VICODIN) 5-325 MG tablet One tablet every four hours for pain.   [DISCONTINUED] lisinopril (ZESTRIL) 2.5 MG tablet Take 2.5 mg by mouth daily.   [DISCONTINUED] loperamide (IMODIUM) 2 MG capsule Take 1 capsule (2 mg total) by mouth 4 (four) times daily as needed for diarrhea or loose stools.   [DISCONTINUED] metFORMIN (GLUCOPHAGE XR) 500 MG 24 hr tablet Take 1 tablet (500 mg total) by mouth daily with breakfast.   [DISCONTINUED] predniSONE (DELTASONE) 10 MG tablet Take 2 tablets (20 mg total) by mouth daily.   [DISCONTINUED] promethazine-dextromethorphan (PROMETHAZINE-DM) 6.25-15 MG/5ML syrup Take 5 mLs by mouth 4 (four) times daily as needed.   No facility-administered encounter medications on file as of 06/04/2022.    Allergies  Allergen Reactions   Fish Allergy Anaphylaxis, Shortness Of Breath and Rash   Shrimp [Shellfish Allergy] Anaphylaxis, Shortness Of Breath and Rash   Asa [Aspirin] Other (See Comments)    Abdominal swelling    Penicillins Hives, Itching and Rash    Has patient had a PCN reaction causing immediate rash, facial/tongue/throat swelling, SOB or lightheadedness with hypotension: Yes Has patient had a PCN reaction causing severe rash involving mucus membranes or skin necrosis: No Has patient had a PCN reaction that required hospitalization: Yes Has patient had a PCN reaction occurring within the last 10 years: Yes If all of the above  answers are "NO", then may proceed with Cephalosporin use.     Review of Systems  Constitutional:  Positive for activity change and unexpected weight change. Negative for appetite change, chills, diaphoresis, fatigue and fever.  Eyes:  Negative for photophobia and visual disturbance.  Respiratory:  Negative for cough, choking, chest tightness and shortness of breath.   Cardiovascular:  Positive for leg swelling. Negative for chest pain and palpitations.  Gastrointestinal:  Negative for abdominal pain, diarrhea, nausea and vomiting.  Endocrine: Positive for polydipsia, polyphagia and polyuria.  Genitourinary:  Positive for dysuria. Negative for decreased urine volume, difficulty urinating, dyspareunia, enuresis, flank pain, frequency, genital sores, hematuria, menstrual problem, pelvic pain, urgency, vaginal bleeding, vaginal discharge and vaginal pain.  Musculoskeletal:  Positive for arthralgias and myalgias.  Skin:  Positive for rash.  Neurological:  Negative for dizziness, tremors, seizures, syncope, facial asymmetry, speech difficulty, weakness, light-headedness, numbness and headaches.  Psychiatric/Behavioral:  Negative for confusion.   All other  systems reviewed and are negative.       Objective:  BP 125/80   Pulse 76   Temp 98.8 F (37.1 C)   Ht 5' 5" (1.651 m)   Wt 283 lb (128.4 kg)   SpO2 94%   BMI 47.09 kg/m    Wt Readings from Last 3 Encounters:  06/04/22 283 lb (128.4 kg)  04/15/22 275 lb (124.7 kg)  08/31/20 265 lb (120.2 kg)    Physical Exam Vitals and nursing note reviewed.  Constitutional:      Appearance: Normal appearance. She is morbidly obese.  HENT:     Head: Normocephalic and atraumatic.     Right Ear: Tympanic membrane, ear canal and external ear normal.     Left Ear: Tympanic membrane, ear canal and external ear normal.     Mouth/Throat:     Mouth: Mucous membranes are moist.  Eyes:     Conjunctiva/sclera: Conjunctivae normal.     Pupils:  Pupils are equal, round, and reactive to light.  Cardiovascular:     Rate and Rhythm: Normal rate and regular rhythm.     Pulses: Normal pulses.     Heart sounds: Normal heart sounds. No murmur heard.    No friction rub. No gallop.  Pulmonary:     Effort: Pulmonary effort is normal.     Breath sounds: Normal breath sounds.  Abdominal:     General: Bowel sounds are normal.     Palpations: Abdomen is soft.  Musculoskeletal:     Right lower leg: Edema (trace) present.     Left lower leg: Edema (trace) present.  Skin:    General: Skin is warm and dry.     Capillary Refill: Capillary refill takes less than 2 seconds.     Comments: Scattered SKs to back  Neurological:     General: No focal deficit present.     Mental Status: She is alert and oriented to person, place, and time.  Psychiatric:        Mood and Affect: Mood normal.        Behavior: Behavior normal.        Thought Content: Thought content normal.        Judgment: Judgment normal.     Results for orders placed or performed during the hospital encounter of 01/15/21  Covid-19, Flu A+B ([facility])   Specimen: Nasopharyngeal   Naso  Result Value Ref Range   SARS-CoV-2, NAA Detected (A) Not Detected   Influenza A, NAA Not Detected Not Detected   Influenza B, NAA Not Detected Not Detected       Pertinent labs & imaging results that were available during my care of the patient were reviewed by me and considered in my medical decision making.  Assessment & Plan:  Jarita was seen today for new patient (initial visit).  Diagnoses and all orders for this visit:  Encounter to establish care Pt presents today to establish care with new PCP. She was formerly followed by Dr. Legrand Rams and those records have been requested.   Type 2 diabetes mellitus with other specified complication, without long-term current use of insulin (HCC) Has not been on medications in over 4 months. Will obtain labs today and restart medications as  warranted. Would benefit from [place] as pt is morbidly obese. Diet and exercise encouraged. -     CBC with Differential/Platelet -     CMP14+EGFR -     Lipid panel -     Microalbumin / creatinine  urine ratio -     Bayer DCA Hb A1c Waived  Hypertension associated with diabetes ([place]) Has not been on medications in over 4 months. BP well controlled in office today but due to history of DM, needs to be on ACEi or ARB. Will obtain baseline labs today and restart medications as warranted. DASH diet and exercise encouraged.  -     CBC with Differential/Platelet -     CMP14+EGFR -     Thyroid Panel With TSH -     Microalbumin / creatinine urine ratio  Hyperlipidemia associated with type 2 diabetes mellitus ([place]) Has not been on medications in over 4 months. Will obtain baseline labs today and restart medications as warranted. Diet and exercise encouraged.  -     CMP14+EGFR -     Lipid panel  Morbid obesity ([place]) Diet and exercise encouraged. Labs pending. Will add Ozempic to diabetic regimen pending lab results.  -     CBC with Differential/Platelet -     CMP14+EGFR -     Lipid panel -     Thyroid Panel With TSH -     Bayer DCA Hb A1c Waived  Hepatic steatosis Will check labs today. Diet and exercise encouraged. -     CMP14+EGFR  Dysuria Will check urine today and treat if warranted.  -     Urinalysis, Routine w reflex microscopic     Continue all other maintenance medications.  Follow up plan: Return in about 22 weeks (around 11/05/2022) for DM, medications.   Continue healthy lifestyle choices, including diet (rich in fruits, vegetables, and lean proteins, and low in salt and simple carbohydrates) and exercise (at least 30 minutes of moderate physical activity daily).  Educational handout given for DM and SK  The above assessment and management plan was discussed with the patient. The patient verbalized understanding of and has agreed to the management plan. Patient is aware  to Butler the clinic if they develop any new symptoms or if symptoms persist or worsen. Patient is aware when to return to the clinic for a follow-up visit. Patient educated on when it is appropriate to go to the emergency department.   Monia Pouch, FNP-C [facility] [phone]

## 2022-06-04 NOTE — Patient Instructions (Signed)

## 2022-06-05 ENCOUNTER — Other Ambulatory Visit: Payer: Self-pay

## 2022-06-05 DIAGNOSIS — D691 Qualitative platelet defects: Secondary | ICD-10-CM

## 2022-06-05 LAB — CMP14+EGFR
ALT: 31 IU/L (ref 0–32)
AST: 30 IU/L (ref 0–40)
Albumin/Globulin Ratio: 1.3 (ref 1.2–2.2)
Albumin: 4 g/dL (ref 3.8–4.8)
Alkaline Phosphatase: 93 IU/L (ref 44–121)
BUN/Creatinine Ratio: 16 (ref 9–23)
BUN: 13 mg/dL (ref 6–24)
Bilirubin Total: 0.4 mg/dL (ref 0.0–1.2)
CO2: 21 mmol/L (ref 20–29)
Calcium: 9.5 mg/dL (ref 8.7–10.2)
Chloride: 103 mmol/L (ref 96–106)
Creatinine, Ser: 0.8 mg/dL (ref 0.57–1.00)
Globulin, Total: 3 g/dL (ref 1.5–4.5)
Glucose: 117 mg/dL — ABNORMAL HIGH (ref 70–99)
Potassium: 4.2 mmol/L (ref 3.5–5.2)
Sodium: 140 mmol/L (ref 134–144)
Total Protein: 7 g/dL (ref 6.0–8.5)
eGFR: 90 mL/min/{1.73_m2} (ref >59)

## 2022-06-05 LAB — THYROID PANEL WITH TSH
Free Thyroxine Index: 1.5 (ref 1.2–4.9)
T3 Uptake Ratio: 22 % — ABNORMAL LOW (ref 24–39)
T4, Total: 6.8 ug/dL (ref 4.5–12.0)
TSH: 3.81 u[IU]/mL (ref 0.450–4.500)

## 2022-06-05 LAB — CBC WITH DIFFERENTIAL/PLATELET
Basophils Absolute: 0.1 10*3/uL (ref 0.0–0.2)
Basos: 1 %
EOS (ABSOLUTE): 0.2 10*3/uL (ref 0.0–0.4)
Eos: 4 %
Hematocrit: 42.3 % (ref 34.0–46.6)
Hemoglobin: 14.3 g/dL (ref 11.1–15.9)
Immature Grans (Abs): 0 10*3/uL (ref 0.0–0.1)
Immature Granulocytes: 1 %
Lymphocytes Absolute: 1.8 10*3/uL (ref 0.7–3.1)
Lymphs: 30 %
MCH: 30.4 pg (ref 26.6–33.0)
MCHC: 33.8 g/dL (ref 31.5–35.7)
MCV: 90 fL (ref 79–97)
Monocytes Absolute: 0.5 10*3/uL (ref 0.1–0.9)
Monocytes: 8 %
Neutrophils Absolute: 3.4 10*3/uL (ref 1.4–7.0)
Neutrophils: 56 %
Platelets: 129 10*3/uL — ABNORMAL LOW (ref 150–450)
RBC: 4.7 x10E6/uL (ref 3.77–5.28)
RDW: 13.1 % (ref 11.7–15.4)
WBC: 6 10*3/uL (ref 3.4–10.8)

## 2022-06-05 LAB — LIPID PANEL
Chol/HDL Ratio: 6.1 ratio — ABNORMAL HIGH (ref 0.0–4.4)
Cholesterol, Total: 238 mg/dL — ABNORMAL HIGH (ref 100–199)
HDL: 39 mg/dL — ABNORMAL LOW (ref >39)
LDL Chol Calc (NIH): 163 mg/dL — ABNORMAL HIGH (ref 0–99)
Triglycerides: 196 mg/dL — ABNORMAL HIGH (ref 0–149)
VLDL Cholesterol Cal: 36 mg/dL (ref 5–40)

## 2022-06-05 LAB — MICROALBUMIN / CREATININE URINE RATIO
Creatinine, Urine: 80.5 mg/dL
Microalb/Creat Ratio: 8 mg/g creat (ref 0–29)
Microalbumin, Urine: 6.1 ug/mL

## 2022-06-05 MED ORDER — METFORMIN HCL ER 500 MG PO TB24: 500.0000 mg | ORAL_TABLET | Freq: Every day | ORAL | 3 refills | Status: DC

## 2022-06-05 MED ORDER — LISINOPRIL 2.5 MG PO TABS: 2.5000 mg | ORAL_TABLET | Freq: Every day | ORAL | 2 refills | Status: DC

## 2022-06-05 MED ORDER — ATORVASTATIN CALCIUM 10 MG PO TABS: 10.0000 mg | ORAL_TABLET | Freq: Every day | ORAL | 2 refills | Status: DC

## 2022-06-05 NOTE — Addendum Note (Signed)
Addended by: Sonny Masters on: 06/05/2022 10:58 AM   Modules accepted: Orders

## 2022-06-11 ENCOUNTER — Other Ambulatory Visit: Payer: Medicare Other

## 2022-06-11 DIAGNOSIS — D691 Qualitative platelet defects: Secondary | ICD-10-CM | POA: Diagnosis not present

## 2022-06-12 ENCOUNTER — Telehealth: Payer: Self-pay

## 2022-06-12 ENCOUNTER — Other Ambulatory Visit: Payer: Self-pay | Admitting: Family Medicine

## 2022-06-12 ENCOUNTER — Ambulatory Visit (INDEPENDENT_AMBULATORY_CARE_PROVIDER_SITE_OTHER): Payer: Medicare Other

## 2022-06-12 VITALS — Wt 283.0 lb

## 2022-06-12 DIAGNOSIS — Z0000 Encounter for general adult medical examination without abnormal findings: Secondary | ICD-10-CM | POA: Diagnosis not present

## 2022-06-12 DIAGNOSIS — E1159 Type 2 diabetes mellitus with other circulatory complications: Secondary | ICD-10-CM

## 2022-06-12 DIAGNOSIS — E1169 Type 2 diabetes mellitus with other specified complication: Secondary | ICD-10-CM

## 2022-06-12 DIAGNOSIS — K760 Fatty (change of) liver, not elsewhere classified: Secondary | ICD-10-CM

## 2022-06-12 LAB — CBC WITH DIFFERENTIAL/PLATELET
Basophils Absolute: 0 10*3/uL (ref 0.0–0.2)
Basos: 0 %
EOS (ABSOLUTE): 0.2 10*3/uL (ref 0.0–0.4)
Eos: 3 %
Hematocrit: 42.5 % (ref 34.0–46.6)
Hemoglobin: 14.4 g/dL (ref 11.1–15.9)
Immature Grans (Abs): 0 10*3/uL (ref 0.0–0.1)
Immature Granulocytes: 0 %
Lymphocytes Absolute: 1.7 10*3/uL (ref 0.7–3.1)
Lymphs: 24 %
MCH: 30.4 pg (ref 26.6–33.0)
MCHC: 33.9 g/dL (ref 31.5–35.7)
MCV: 90 fL (ref 79–97)
Monocytes Absolute: 0.6 10*3/uL (ref 0.1–0.9)
Monocytes: 9 %
Neutrophils Absolute: 4.4 10*3/uL (ref 1.4–7.0)
Neutrophils: 64 %
Platelets: 128 10*3/uL — ABNORMAL LOW (ref 150–450)
RBC: 4.74 x10E6/uL (ref 3.77–5.28)
RDW: 13.5 % (ref 11.7–15.4)
WBC: 7 10*3/uL (ref 3.4–10.8)

## 2022-06-12 NOTE — Progress Notes (Signed)
Subjective:   Paula Butler is a 51 y.o. female who presents for an Initial Medicare Annual Wellness Visit.  Virtual Visit via Telephone Note  I connected with  Timmie Foerster on 06/12/22 at  2:00 PM EDT by telephone and verified that I am speaking with the correct person using two identifiers.  Location: Patient: Home Provider: [facility] Persons participating in the virtual visit: patient/Nurse Health Advisor   I discussed the limitations, risks, security and privacy concerns of performing an evaluation and management service by telephone and the availability of in person appointments. The patient expressed understanding and agreed to proceed.  Interactive audio and video telecommunications were attempted between this nurse and patient, however failed, due to patient having technical difficulties OR patient did not have access to video capability.  We continued and completed visit with audio only.  Some vital signs may be absent or patient reported.   Rubens Cranston E Josemiguel Gries, LPN   Review of Systems     Cardiac Risk Factors include: advanced age (>25men, >23 women);diabetes mellitus;dyslipidemia;hypertension;obesity (BMI >30kg/m2);sedentary lifestyle;Other (see comment), Risk factor comments: hepatic steatosis     Objective:    Today's Vitals   06/12/22 1423  Weight: 283 lb (128.4 kg)  PainSc: 7    Body mass index is 47.09 kg/m.     06/12/2022    2:30 PM 04/15/2022    3:40 PM 05/25/2018    8:09 AM 12/19/2017    9:01 PM 10/03/2017    1:08 PM 07/21/2017    1:40 PM  Advanced Directives  Does Patient Have a Medical Advance Directive? No No No No No No  Would patient like information on creating a medical advance directive? No - Patient declined     No - Patient declined    Current Medications (verified) Outpatient Encounter Medications as of 06/12/2022  Medication Sig   atorvastatin (LIPITOR) 10 MG tablet Take 1 tablet (10 mg total) by mouth daily.   lisinopril (ZESTRIL) 2.5 MG  tablet Take 1 tablet (2.5 mg total) by mouth daily.   metFORMIN (GLUCOPHAGE XR) 500 MG 24 hr tablet Take 1 tablet (500 mg total) by mouth daily with breakfast.   No facility-administered encounter medications on file as of 06/12/2022.    Allergies (verified) Fish allergy, Shrimp [shellfish allergy], Asa [aspirin], and Penicillins   History: Past Medical History:  Diagnosis Date   Diabetes mellitus without complication (HCC)    Hyperlipidemia    Hypertension    Past Surgical History:  Procedure Laterality Date   CHOLECYSTECTOMY     ESOPHAGOGASTRODUODENOSCOPY  06/14/2009   Dr. Otho Najjar; mild esophagitis biopsied, small gastric ulcer-healing s/p biopsied, gastritis biopsied, mild duodenitis biopsied.  Esophageal biopsy unremarkable, gastric and duodenal biopsy with no significant histopathologic features.   EXCISION OF BREAST BIOPSY Left 05/29/2018   Procedure: NIPPLE EXPLORATION/EXCISION RETROAREOLA TISSUE LEFT BREAST;  Surgeon: Lucretia Roers, MD;  Location: [facility];  Service: General;  Laterality: Left;   TUBAL LIGATION     Family History  Problem Relation Age of Onset   Diabetes Mother    Hypothyroidism Mother    Prostate cancer Father    Breast cancer Sister    Stroke Brother    Social History   Socioeconomic History   Marital status: Married    Spouse name: Jose   Number of children: 3   Years of education: Not on file   Highest education level: Not on file  Occupational History   Occupation: disability  Tobacco Use  Smoking status: Never   Smokeless tobacco: Never  Vaping Use   Vaping Use: Never used  Substance and Sexual Activity   Alcohol use: No   Drug use: No   Sexual activity: Not Currently    Partners: Male    Birth control/protection: Surgical    Comment: tubal  Other Topics Concern   Not on file  Social History Narrative   Married for 10 years, 3 children, all grown/teenagers.    2 daughters, 1 son - children live with her   Attends church.     Walks for exercise.   Enjoys singing.   Eats all food groups.   Wears seatbelt.   Born in [place], grew up in [place]. Father in [employer]. Grandmother raised her.    Moved from [place] in 2018.    Does not work out side of home.    Social Determinants of Health   Financial Resource Strain: Low Risk  (06/12/2022)   Overall Financial Resource Strain (CARDIA)    Difficulty of Paying Living Expenses: Not hard at all  Food Insecurity: No Food Insecurity (06/12/2022)   Hunger Vital Sign    Worried About Running Out of Food in the Last Year: Never true    Ran Out of Food in the Last Year: Never true  Transportation Needs: No Transportation Needs (06/12/2022)   PRAPARE - Administrator, Civil Service (Medical): No    Lack of Transportation (Non-Medical): No  Physical Activity: Inactive (06/12/2022)   Exercise Vital Sign    Days of Exercise per Week: 0 days    Minutes of Exercise per Session: 0 min  Stress: No Stress Concern Present (06/12/2022)   [employer] of Occupational Health - Occupational Stress Questionnaire    Feeling of Stress : Not at all  Social Connections: Moderately Isolated (06/12/2022)   Social Connection and Isolation Panel [NHANES]    Frequency of Communication with Friends and Family: More than three times a week    Frequency of Social Gatherings with Friends and Family: More than three times a week    Attends Religious Services: Never    Database administrator or Organizations: No    Attends Engineer, structural: Never    Marital Status: Married    Tobacco Counseling Counseling given: Not Answered   Clinical Intake:  Pre-visit preparation completed: Yes  Pain : 0-10 Pain Score: 7  Pain Type: Acute pain Pain Location: Knee Pain Orientation: Right, Left Pain Descriptors / Indicators: Aching, Discomfort, Tender Pain Onset: Today Pain Frequency: Constant     BMI - recorded: 47.09 Nutritional Status: BMI > 30  Obese Nutritional  Risks: None Diabetes: Yes CBG done?: No Did pt. bring in CBG monitor from home?: No  How often do you need to have someone help you when you read instructions, pamphlets, or other written materials from your doctor or pharmacy?: 1 - Never  Diabetic? Nutrition Risk Assessment:  Has the patient had any N/V/D within the last 2 months?  No  Does the patient have any non-healing wounds?  No  Has the patient had any unintentional weight loss or weight gain?  No   Diabetes:  Is the patient diabetic?  Yes  If diabetic, was a CBG obtained today?  No  Did the patient bring in their glucometer from home?  No  How often do you monitor your CBG's? Once or twice per day - 127 this am per patient.   Financial Strains and Diabetes Management:  Are you having any financial strains with the device, your supplies or your medication? No .  Does the patient want to be seen by Chronic Care Management for management of their diabetes?  No  Would the patient like to be referred to a Nutritionist or for Diabetic Management?  Yes   Diabetic Exams:  Diabetic Eye Exam: Completed 7/26/201. Overdue for diabetic eye exam. Pt has been advised about the importance in completing this exam.   Diabetic Foot Exam: Completed 06/04/2022. Pt has been advised about the importance in completing this exam.   Interpreter Needed?: No  Information entered by :: Tammy Wickliffe, LPN   Activities of Daily Living    06/12/2022    2:31 PM  In your present state of health, do you have any difficulty performing the following activities:  Hearing? 0  Vision? 0  Difficulty concentrating or making decisions? 0  Walking or climbing stairs? 0  Dressing or bathing? 0  Doing errands, shopping? 0  Preparing Food and eating ? N  Using the Toilet? N  In the past six months, have you accidently leaked urine? Y  Comment has to wear pads at all times  Do you have problems with loss of bowel control? N  Managing your Medications? N   Managing your Finances? N  Housekeeping or managing your Housekeeping? N    Patient Care Team: Sonny Mastersakes, Linda M, FNP as PCP - General (Family Medicine) Lanelle Balarver, Charles K, DO as Consulting Physician (Gastroenterology)  Indicate any recent Medical Services you may have received from other than [facility] providers in the past year (date may be approximate).     Assessment:   This is a routine wellness examination for Helmut Musterlicia.  Hearing/Vision screen Hearing Screening - Comments:: Denies hearing difficulties   Vision Screening - Comments:: Wears rx glasses - [place] [facility] [place] [place] - [facility] [place] [facility]  Dietary issues and exercise activities discussed: Current Exercise Habits: The patient does not participate in regular exercise at present, Exercise limited by: None identified   Goals Addressed             This Visit's Progress    Weight (lb) < 200 lb (90.7 kg)   283 lb (128.4 kg)      Depression Screen    06/12/2022    2:28 PM 06/04/2022   10:31 AM 06/10/2018    2:42 PM 05/06/2018   10:37 AM 05/06/2018    8:39 AM 02/27/2018   10:05 AM  PHQ 2/9 Scores  PHQ - 2 Score 2 0 0 0 0 0  PHQ- 9 Score 4         Fall Risk    06/12/2022    2:26 PM 04/26/2019    3:43 PM 05/06/2018   10:37 AM 05/06/2018    8:39 AM 02/27/2018   10:05 AM  Fall Risk   Falls in the past year? 0 0 No No No  Number falls in past yr: 0      Injury with Fall? 0      Risk for fall due to : No Fall Risks      Follow up Falls prevention discussed Falls evaluation completed       FALL RISK PREVENTION PERTAINING TO THE HOME:  Any stairs in or around the home? No  If so, are there any without handrails? No  Home free of loose throw rugs in walkways, pet beds, electrical cords, etc? Yes  Adequate  lighting in your home to reduce risk of falls? Yes   ASSISTIVE DEVICES UTILIZED TO PREVENT FALLS:  Life alert? No  Use of a cane, walker or w/c? No  Grab bars in the  bathroom? No  Shower chair or bench in shower? No  Elevated toilet seat or a handicapped toilet? No   TIMED UP AND GO:  Was the test performed? No . Telephonic visit  Cognitive Function:        06/12/2022    2:33 PM  6CIT Screen  What Year? 0 points  What month? 0 points  What time? 0 points  Count back from 20 0 points  Months in reverse 2 points  Repeat phrase 0 points  Total Score 2 points    Immunizations Immunization History  Administered Date(s) Administered   Influenza,inj,Quad PF,6+ Mos 10/01/2018   Pneumococcal Conjugate-13 02/27/2018   Td 07/14/2020   Tdap 02/27/2018    TDAP status: Up to date  Flu Vaccine status: Due, Education has been provided regarding the importance of this vaccine. Advised may receive this vaccine at local pharmacy or Health Dept. Aware to provide a copy of the vaccination record if obtained from local pharmacy or Health Dept. Verbalized acceptance and understanding.  Pneumococcal vaccine status: Up to date  Covid-19 vaccine status: Declined, Education has been provided regarding the importance of this vaccine but patient still declined. Advised may receive this vaccine at local pharmacy or Health Dept.or vaccine clinic. Aware to provide a copy of the vaccination record if obtained from local pharmacy or Health Dept. Verbalized acceptance and understanding.  Qualifies for Shingles Vaccine? No   Zostavax completed No    Screening Tests Health Maintenance  Topic Date Due   COLONOSCOPY (Pts 45-63yrs Insurance coverage will need to be confirmed)  Never done   OPHTHALMOLOGY EXAM  07/11/2019   MAMMOGRAM  05/06/2020   PAP SMEAR-Modifier  05/06/2021   COVID-19 Vaccine (1) 06/20/2022 (Originally 10/10/1971)   INFLUENZA VACCINE  07/16/2022   HEMOGLOBIN A1C  12/04/2022   FOOT EXAM  06/05/2023   TETANUS/TDAP  07/14/2030   Hepatitis C Screening  Completed   HIV Screening  Completed   HPV VACCINES  Aged Out    Health Maintenance  Health  Maintenance Due  Topic Date Due   COLONOSCOPY (Pts 45-25yrs Insurance coverage will need to be confirmed)  Never done   OPHTHALMOLOGY EXAM  07/11/2019   MAMMOGRAM  05/06/2020   PAP SMEAR-Modifier  05/06/2021    Colorectal Cancer Screening: due at age 89  Mammogram status: Ordered 06/12/2022. Pt provided with contact info and advised to call to schedule appt.   DEXA: due at age 51  Lung Cancer Screening: (Low Dose CT Chest recommended if Age 13-80 years, 30 pack-year currently smoking OR have quit w/in 15years.) does not qualify.  Additional Screening:  Hepatitis C Screening: does not qualify; Completed 03/24/2018  Vision Screening: Recommended annual ophthalmology exams for early detection of glaucoma and other disorders of the eye. Is the patient up to date with their annual eye exam?  No  Who is the provider or what is the name of the office in which the patient attends annual eye exams? [facility] - will likely go [facility] in [place] next If pt is not established with a provider, would they like to be referred to a provider to establish care? No .   Dental Screening: Recommended annual dental exams for proper oral hygiene  Community Resource Referral / Chronic Care Management: CRR required  this visit?  No   CCM required this visit?  No      Plan:     I have personally reviewed and noted the following in the patient's chart:   Medical and social history Use of alcohol, tobacco or illicit drugs  Current medications and supplements including opioid prescriptions. Patient is not currently taking opioid prescriptions. Functional ability and status Nutritional status Physical activity Advanced directives List of other physicians Hospitalizations, surgeries, and ER visits in previous 12 months Vitals Screenings to include cognitive, depression, and falls Referrals and appointments  In addition, I have reviewed and discussed with patient certain preventive  protocols, quality metrics, and best practice recommendations. A written personalized care plan for preventive services as well as general preventive health recommendations were provided to patient.     [place] Constable, LPN   3/79/0240   Nurse Notes: She wants you to start doing her annual physicals and pap smears if you are okay with it. She has appt 7/5 for 15 min routine visit and says she will discuss with you then and schedule at checkout.

## 2022-06-12 NOTE — Patient Instructions (Signed)
Ms. Woodfield , Thank you for taking time to come for your Medicare Wellness Visit. I appreciate your ongoing commitment to your health goals. Please review the following plan we discussed and let me know if I can assist you in the future.   Screening recommendations/referrals: Colonoscopy: due at age 51 Mammogram: Done 05/07/2019 - I made you an appointment with mobile unit at our office for Monday 07/08/22 @ 1:10pm - we recommend you repeat these every year Bone Density: Due at age 8 Recommended yearly ophthalmology/optometry visit for glaucoma screening and checkup Recommended yearly dental visit for hygiene and checkup  Vaccinations: Influenza vaccine: recommended every fall Pneumococcal vaccine: Done 02/27/2018 Tdap vaccine: Done 07/14/2020 - Repeat in 10 years Shingles vaccine: Due at age 50 - Shingrix is 2 doses 2-6 months apart and over 90% effective    Covid-19: Declined  Advanced directives: Advance directive discussed with you today. Even though you declined this today, please call our office should you change your mind, and we can give you the proper paperwork for you to fill out.   Conditions/risks identified: Aim for 30 minutes of exercise or brisk walking, 6-8 glasses of water, and 5 servings of fruits and vegetables each day. See the tips at the end of this summary for counting carbs for diabetes and helping lose weight.  Next appointment: Follow up in one year for your annual wellness visit.   Preventive Care 40-64 Years, Female Preventive care refers to lifestyle choices and visits with your health care provider that can promote health and wellness. What does preventive care include? A yearly physical exam. This is also called an annual well check. Dental exams once or twice a year. Routine eye exams. Ask your health care provider how often you should have your eyes checked. Personal lifestyle choices, including: Daily care of your teeth and gums. Regular physical  activity. Eating a healthy diet. Avoiding tobacco and drug use. Limiting alcohol use. Practicing safe sex. Taking low-dose aspirin daily starting at age 39. Taking vitamin and mineral supplements as recommended by your health care provider. What happens during an annual well check? The services and screenings done by your health care provider during your annual well check will depend on your age, overall health, lifestyle risk factors, and family history of disease. Counseling  Your health care provider may ask you questions about your: Alcohol use. Tobacco use. Drug use. Emotional well-being. Home and relationship well-being. Sexual activity. Eating habits. Work and work Statistician. Method of birth control. Menstrual cycle. Pregnancy history. Screening  You may have the following tests or measurements: Height, weight, and BMI. Blood pressure. Lipid and cholesterol levels. These may be checked every 5 years, or more frequently if you are over 68 years old. Skin check. Lung cancer screening. You may have this screening every year starting at age 69 if you have a 30-pack-year history of smoking and currently smoke or have quit within the past 15 years. Fecal occult blood test (FOBT) of the stool. You may have this test every year starting at age 22. Flexible sigmoidoscopy or colonoscopy. You may have a sigmoidoscopy every 5 years or a colonoscopy every 10 years starting at age 6. Hepatitis C blood test. Hepatitis B blood test. Sexually transmitted disease (STD) testing. Diabetes screening. This is done by checking your blood sugar (glucose) after you have not eaten for a while (fasting). You may have this done every 1-3 years. Mammogram. This may be done every 1-2 years. Talk to your health care  provider about when you should start having regular mammograms. This may depend on whether you have a family history of breast cancer. BRCA-related cancer screening. This may be done if  you have a family history of breast, ovarian, tubal, or peritoneal cancers. Pelvic exam and Pap test. This may be done every 3 years starting at age 50. Starting at age 32, this may be done every 5 years if you have a Pap test in combination with an HPV test. Bone density scan. This is done to screen for osteoporosis. You may have this scan if you are at high risk for osteoporosis. Discuss your test results, treatment options, and if necessary, the need for more tests with your health care provider. Vaccines  Your health care provider may recommend certain vaccines, such as: Influenza vaccine. This is recommended every year. Tetanus, diphtheria, and acellular pertussis (Tdap, Td) vaccine. You may need a Td booster every 10 years. Zoster vaccine. You may need this after age 26. Pneumococcal 13-valent conjugate (PCV13) vaccine. You may need this if you have certain conditions and were not previously vaccinated. Pneumococcal polysaccharide (PPSV23) vaccine. You may need one or two doses if you smoke cigarettes or if you have certain conditions. Talk to your health care provider about which screenings and vaccines you need and how often you need them. This information is not intended to replace advice given to you by your health care provider. Make sure you discuss any questions you have with your health care provider. Document Released: 12/29/2015 Document Revised: 08/21/2016 Document Reviewed: 10/03/2015 Elsevier Interactive Patient Education  2017 [place] Prevention in the Home Falls can cause injuries. They can happen to people of all ages. There are many things you can do to make your home safe and to help prevent falls. What can I do on the outside of my home? Regularly fix the edges of walkways and driveways and fix any cracks. Remove anything that might make you trip as you walk through a door, such as a raised step or threshold. Trim any bushes or trees on the path to your  home. Use bright outdoor lighting. Clear any walking paths of anything that might make someone trip, such as rocks or tools. Regularly check to see if handrails are loose or broken. Make sure that both sides of any steps have handrails. Any raised decks and porches should have guardrails on the edges. Have any leaves, snow, or ice cleared regularly. Use sand or salt on walking paths during winter. Clean up any spills in your garage right away. This includes oil or grease spills. What can I do in the bathroom? Use night lights. Install grab bars by the toilet and in the tub and shower. Do not use towel bars as grab bars. Use non-skid mats or decals in the tub or shower. If you need to sit down in the shower, use a plastic, non-slip stool. Keep the floor dry. Clean up any water that spills on the floor as soon as it happens. Remove soap buildup in the tub or shower regularly. Attach bath mats securely with double-sided non-slip rug tape. Do not have throw rugs and other things on the floor that can make you trip. What can I do in the bedroom? Use night lights. Make sure that you have a light by your bed that is easy to reach. Do not use any sheets or blankets that are too big for your bed. They should not hang down onto the  floor. Have a firm chair that has side arms. You can use this for support while you get dressed. Do not have throw rugs and other things on the floor that can make you trip. What can I do in the kitchen? Clean up any spills right away. Avoid walking on wet floors. Keep items that you use a lot in easy-to-reach places. If you need to reach something above you, use a strong step stool that has a grab bar. Keep electrical cords out of the way. Do not use floor polish or wax that makes floors slippery. If you must use wax, use non-skid floor wax. Do not have throw rugs and other things on the floor that can make you trip. What can I do with my stairs? Do not leave any  items on the stairs. Make sure that there are handrails on both sides of the stairs and use them. Fix handrails that are broken or loose. Make sure that handrails are as long as the stairways. Check any carpeting to make sure that it is firmly attached to the stairs. Fix any carpet that is loose or worn. Avoid having throw rugs at the top or bottom of the stairs. If you do have throw rugs, attach them to the floor with carpet tape. Make sure that you have a light switch at the top of the stairs and the bottom of the stairs. If you do not have them, ask someone to add them for you. What else can I do to help prevent falls? Wear shoes that: Do not have high heels. Have rubber bottoms. Are comfortable and fit you well. Are closed at the toe. Do not wear sandals. If you use a stepladder: Make sure that it is fully opened. Do not climb a closed stepladder. Make sure that both sides of the stepladder are locked into place. Ask someone to hold it for you, if possible. Clearly mark and make sure that you can see: Any grab bars or handrails. First and last steps. Where the edge of each step is. Use tools that help you move around (mobility aids) if they are needed. These include: Canes. Walkers. Scooters. Crutches. Turn on the lights when you go into a dark area. Replace any light bulbs as soon as they burn out. Set up your furniture so you have a clear path. Avoid moving your furniture around. If any of your floors are uneven, fix them. If there are any pets around you, be aware of where they are. Review your medicines with your doctor. Some medicines can make you feel dizzy. This can increase your chance of falling. Ask your doctor what other things that you can do to help prevent falls. This information is not intended to replace advice given to you by your health care provider. Make sure you discuss any questions you have with your health care provider. Document Released: 09/28/2009  Document Revised: 05/09/2016 Document Reviewed: 01/06/2015 Elsevier Interactive Patient Education  2017 [place] for Diabetes Mellitus, Adult Carbohydrate counting is a method of keeping track of how many carbohydrates you eat. Eating carbohydrates increases the amount of sugar (glucose) in the blood. Counting how many carbohydrates you eat improves how well you manage your blood glucose. This, in turn, helps you manage your diabetes. Carbohydrates are measured in grams (g) per serving. It is important to know how many carbohydrates (in grams or by serving size) you can have in each meal. This is different for every person. A  dietitian can help you make a meal plan and calculate how many carbohydrates you should have at each meal and snack. What foods contain carbohydrates? Carbohydrates are found in the following foods: Grains, such as breads and cereals. Dried beans and soy products. Starchy vegetables, such as potatoes, peas, and corn. Fruit and fruit juices. Milk and yogurt. Sweets and snack foods, such as cake, cookies, candy, chips, and soft drinks. How do I count carbohydrates in foods? There are two ways to count carbohydrates in food. You can read food labels or learn standard serving sizes of foods. You can use either of these methods or a combination of both. Using the Nutrition Facts label The Nutrition Facts list is included on the labels of almost all packaged foods and beverages in the [place]. It includes: The serving size. Information about nutrients in each serving, including the grams of carbohydrate per serving. To use the Nutrition Facts, decide how many servings you will have. Then, multiply the number of servings by the number of carbohydrates per serving. The resulting number is the total grams of carbohydrates that you will be having. Learning the standard serving sizes of foods When you eat carbohydrate foods that are not packaged or  do not include Nutrition Facts on the label, you need to measure the servings in order to count the grams of carbohydrates. Measure the foods that you will eat with a food scale or measuring cup, if needed. Decide how many standard-size servings you will eat. Multiply the number of servings by 15. For foods that contain carbohydrates, one serving equals 15 g of carbohydrates. For example, if you eat 2 cups or 10 oz (300 g) of strawberries, you will have eaten 2 servings and 30 g of carbohydrates (2 servings x 15 g = 30 g). For foods that have more than one food mixed, such as soups and casseroles, you must count the carbohydrates in each food that is included. The following list contains standard serving sizes of common carbohydrate-rich foods. Each of these servings has about 15 g of carbohydrates: 1 slice of bread. 1 six-inch (15 cm) tortilla. ? cup or 2 oz (53 g) cooked rice or pasta.  cup or 3 oz (85 g) cooked or canned, drained and rinsed beans or lentils.  cup or 3 oz (85 g) starchy vegetable, such as peas, corn, or squash.  cup or 4 oz (120 g) hot cereal.  cup or 3 oz (85 g) boiled or mashed potatoes, or  or 3 oz (85 g) of a large baked potato.  cup or 4 fl oz (118 mL) fruit juice. 1 cup or 8 fl oz (237 mL) milk. 1 small or 4 oz (106 g) apple.  or 2 oz (63 g) of a medium banana. 1 cup or 5 oz (150 g) strawberries. 3 cups or 1 oz (28.3 g) popped popcorn. What is an example of carbohydrate counting? To calculate the grams of carbohydrates in this sample meal, follow the steps shown below. Sample meal 3 oz (85 g) chicken breast. ? cup or 4 oz (106 g) brown rice.  cup or 3 oz (85 g) corn. 1 cup or 8 fl oz (237 mL) milk. 1 cup or 5 oz (150 g) strawberries with sugar-free whipped topping. Carbohydrate calculation Identify the foods that contain carbohydrates: Rice. Corn. Milk. Strawberries. Calculate how many servings you have of each food: 2 servings rice. 1 serving  corn. 1 serving milk. 1 serving strawberries. Multiply each number of servings by  15 g: 2 servings rice x 15 g = 30 g. 1 serving corn x 15 g = 15 g. 1 serving milk x 15 g = 15 g. 1 serving strawberries x 15 g = 15 g. Add together all of the amounts to find the total grams of carbohydrates eaten: 30 g + 15 g + 15 g + 15 g = 75 g of carbohydrates total. What are tips for following this plan? Shopping Develop a meal plan and then make a shopping list. Buy fresh and frozen vegetables, fresh and frozen fruit, dairy, eggs, beans, lentils, and whole grains. Look at food labels. Choose foods that have more fiber and less sugar. Avoid processed foods and foods with added sugars. Meal planning Aim to have the same number of grams of carbohydrates at each meal and for each snack time. Plan to have regular, balanced meals and snacks. Where to find more information American Diabetes Association: diabetes.org Centers for Disease Control and Prevention: StoreMirror.com.[place] Academy of Nutrition and Dietetics: eatright.org Association of Diabetes Care & Education Specialists: diabeteseducator.org Summary Carbohydrate counting is a method of keeping track of how many carbohydrates you eat. Eating carbohydrates increases the amount of sugar (glucose) in your blood. Counting how many carbohydrates you eat improves how well you manage your blood glucose. This helps you manage your diabetes. A dietitian can help you make a meal plan and calculate how many carbohydrates you should have at each meal and snack. This information is not intended to replace advice given to you by your health care provider. Make sure you discuss any questions you have with your health care provider. Document Revised: 07/05/2020 Document Reviewed: 07/05/2020 Elsevier Patient Education  [place].

## 2022-06-12 NOTE — Telephone Encounter (Signed)
She would like referral to endocrinologist to have hormones checked - thinks she has hormone imbalance that is causing her to be overweight and diabetic. Also, has a hard time figuring out what is okay to eat with diabetes, and would like to see a nutritionist, but she saw one years ago in [place] and they weren't helpful at all.

## 2022-06-19 ENCOUNTER — Ambulatory Visit: Payer: Medicare Other | Admitting: Family Medicine

## 2022-06-20 ENCOUNTER — Encounter: Payer: Self-pay | Admitting: Family Medicine

## 2022-06-25 ENCOUNTER — Other Ambulatory Visit: Payer: Self-pay | Admitting: Family Medicine

## 2022-06-25 DIAGNOSIS — Z1231 Encounter for screening mammogram for malignant neoplasm of breast: Secondary | ICD-10-CM

## 2022-07-02 ENCOUNTER — Encounter: Payer: Self-pay | Admitting: Family Medicine

## 2022-07-02 ENCOUNTER — Ambulatory Visit (INDEPENDENT_AMBULATORY_CARE_PROVIDER_SITE_OTHER): Payer: Medicare Other | Admitting: Family Medicine

## 2022-07-02 DIAGNOSIS — E1169 Type 2 diabetes mellitus with other specified complication: Secondary | ICD-10-CM

## 2022-07-02 NOTE — Progress Notes (Signed)
Visit not needed today, she is to follow up in 3 months.

## 2022-07-08 ENCOUNTER — Inpatient Hospital Stay: Admission: RE | Admit: 2022-07-08 | Payer: Medicare Other | Source: Ambulatory Visit

## 2022-07-09 ENCOUNTER — Encounter (HOSPITAL_COMMUNITY): Payer: Self-pay | Admitting: Hematology

## 2022-07-09 ENCOUNTER — Inpatient Hospital Stay (HOSPITAL_COMMUNITY): Payer: Medicare Other | Attending: Hematology | Admitting: Hematology

## 2022-07-09 ENCOUNTER — Inpatient Hospital Stay (HOSPITAL_COMMUNITY): Payer: Medicare Other

## 2022-07-09 DIAGNOSIS — Z803 Family history of malignant neoplasm of breast: Secondary | ICD-10-CM | POA: Diagnosis not present

## 2022-07-09 DIAGNOSIS — Z800 Family history of malignant neoplasm of digestive organs: Secondary | ICD-10-CM | POA: Diagnosis not present

## 2022-07-09 DIAGNOSIS — Z8042 Family history of malignant neoplasm of prostate: Secondary | ICD-10-CM | POA: Diagnosis not present

## 2022-07-09 DIAGNOSIS — D696 Thrombocytopenia, unspecified: Secondary | ICD-10-CM | POA: Insufficient documentation

## 2022-07-09 DIAGNOSIS — I10 Essential (primary) hypertension: Secondary | ICD-10-CM | POA: Insufficient documentation

## 2022-07-09 DIAGNOSIS — R040 Epistaxis: Secondary | ICD-10-CM | POA: Diagnosis not present

## 2022-07-09 DIAGNOSIS — E119 Type 2 diabetes mellitus without complications: Secondary | ICD-10-CM | POA: Insufficient documentation

## 2022-07-09 LAB — HEPATITIS B SURFACE ANTIGEN: Hepatitis B Surface Ag: NONREACTIVE

## 2022-07-09 LAB — RETICULOCYTES
Immature Retic Fract: 23.9 % — ABNORMAL HIGH (ref 2.3–15.9)
RBC.: 4.41 MIL/uL (ref 3.87–5.11)
Retic Count, Absolute: 95.7 10*3/uL (ref 19.0–186.0)
Retic Ct Pct: 2.2 % (ref 0.4–3.1)

## 2022-07-09 LAB — CBC WITH DIFFERENTIAL/PLATELET
Abs Immature Granulocytes: 0.02 10*3/uL (ref 0.00–0.07)
Basophils Absolute: 0 10*3/uL (ref 0.0–0.1)
Basophils Relative: 1 %
Eosinophils Absolute: 0.2 10*3/uL (ref 0.0–0.5)
Eosinophils Relative: 3 %
HCT: 42.3 % (ref 36.0–46.0)
Hemoglobin: 13.7 g/dL (ref 12.0–15.0)
Immature Granulocytes: 0 %
Lymphocytes Relative: 32 %
Lymphs Abs: 2.1 10*3/uL (ref 0.7–4.0)
MCH: 30.4 pg (ref 26.0–34.0)
MCHC: 32.4 g/dL (ref 30.0–36.0)
MCV: 93.8 fL (ref 80.0–100.0)
Monocytes Absolute: 0.6 10*3/uL (ref 0.1–1.0)
Monocytes Relative: 8 %
Neutro Abs: 3.7 10*3/uL (ref 1.7–7.7)
Neutrophils Relative %: 56 %
Platelets: 127 10*3/uL — ABNORMAL LOW (ref 150–400)
RBC: 4.51 MIL/uL (ref 3.87–5.11)
RDW: 13.7 % (ref 11.5–15.5)
WBC: 6.7 10*3/uL (ref 4.0–10.5)
nRBC: 0 % (ref 0.0–0.2)

## 2022-07-09 LAB — HEPATITIS B SURFACE ANTIBODY,QUALITATIVE: Hep B S Ab: NONREACTIVE

## 2022-07-09 LAB — LACTATE DEHYDROGENASE: LDH: 170 U/L (ref 98–192)

## 2022-07-09 LAB — FOLATE: Folate: 9.2 ng/mL (ref >5.9)

## 2022-07-09 LAB — HEPATITIS B CORE ANTIBODY, TOTAL: Hep B Core Total Ab: NONREACTIVE

## 2022-07-09 LAB — HEPATITIS C ANTIBODY: HCV Ab: NONREACTIVE

## 2022-07-09 LAB — VITAMIN B12: Vitamin B-12: 446 pg/mL (ref 180–914)

## 2022-07-09 NOTE — Patient Instructions (Addendum)
[facility] at [facility] Discharge Instructions   You were seen and examined today by Dr. Ellin Saba. He is a blood specialist who is seeing you today at the request of your primary care doctor for low platelet count.   We will check additional lab work today to investigate your low platelet count further. We will plan to see you back soon to revie   Thank you for choosing  [facility] Center at [facility] to provide your oncology and hematology care.  To afford each patient quality time with our provider, please arrive at least 15 minutes before your scheduled appointment time.   If you have a lab appointment with the Cancer Center please come in thru the Main Entrance and check in at the main information desk.  You need to re-schedule your appointment should you arrive 10 or more minutes late.  We strive to give you quality time with our providers, and arriving late affects you and other patients whose appointments are after yours.  Also, if you no show three or more times for appointments you may be dismissed from the clinic at the providers discretion.     Again, thank you for choosing [facility].  Our hope is that these requests will decrease the amount of time that you wait before being seen by our physicians.       _____________________________________________________________  Should you have questions after your visit to [facility], please contact our office at [phone] and follow the prompts.  Our office hours are 8:00 a.m. and 4:30 p.m. Monday - Friday.  Please note that voicemails left after 4:00 p.m. may not be returned until the following business day.  We are closed weekends and major holidays.  You do have access to a nurse 24-7, just call the main number to the clinic [phone] and do not press any options, hold on the line and a nurse will answer the phone.    For prescription refill requests, have your  pharmacy contact our office and allow 72 hours.    Due to Covid, you will need to wear a mask upon entering the hospital. If you do not have a mask, a mask will be given to you at the Main Entrance upon arrival. For doctor visits, patients may have 1 support person age 25 or older with them. For treatment visits, patients can not have anyone with them due to social distancing guidelines and our immunocompromised population.

## 2022-07-09 NOTE — Progress Notes (Signed)
[facility] [address] [place] [address]   CLINIC:  Medical Oncology/Hematology  Patient Care Team: Rakes, Doralee Albino, FNP as PCP - General (Family Medicine) Lanelle Bal, DO as Consulting Physician (Gastroenterology)  CHIEF COMPLAINTS/PURPOSE OF CONSULTATION:  Evaluation for mild thrombocytopenia  HISTORY OF PRESENTING ILLNESS:  Paula Butler 51 y.o. female is here because of evaluation for thrombocytopenia, at the request of [facility].  Today she reports feeling good. She reports nosebleeds during hot weather occurring about 1-2 times weekly since 2017; the nosebleeds stop when pressure is applied. She stopped menstruating 2 years ago. She denies vaginal bleeding, spotting, hematochezia, and black stools. She reports she has bruised easily on her arms and hand for her whole life, and she denies spontaneous bruising. When she had menses she reports her bleeding was heavy and lasted for 6-9 days. She denies taking any over the counter herbal supplements, quinine, or excessive eating of walnuts. She denies skin rash, fevers, night sweats, and weight loss. She reports hot flashes since entering menopause. She reports joint pains in her hands and knees.    She is on disability, and previously she sold purses and makeup. She denies smoking history. She drinks occasionally. Her sister died with leukemia and had easy bruising, her father died with stomach cancer, another sister died with breast cancer, her half brother has liver cancer, and her brother has pancreatic cancer. Her mother has RA.   MEDICAL HISTORY:  Past Medical History:  Diagnosis Date   Diabetes mellitus without complication (HCC)    Hyperlipidemia    Hypertension     SURGICAL HISTORY: Past Surgical History:  Procedure Laterality Date   CHOLECYSTECTOMY     ESOPHAGOGASTRODUODENOSCOPY  06/14/2009   Dr. Otho Najjar; mild esophagitis biopsied, small gastric ulcer-healing s/p biopsied, gastritis biopsied,  mild duodenitis biopsied.  Esophageal biopsy unremarkable, gastric and duodenal biopsy with no significant histopathologic features.   EXCISION OF BREAST BIOPSY Left 05/29/2018   Procedure: NIPPLE EXPLORATION/EXCISION RETROAREOLA TISSUE LEFT BREAST;  Surgeon: Lucretia Roers, MD;  Location: [facility];  Service: General;  Laterality: Left;   TUBAL LIGATION      SOCIAL HISTORY: Social History   Socioeconomic History   Marital status: Married    Spouse name: Jose   Number of children: 3   Years of education: Not on file   Highest education level: Not on file  Occupational History   Occupation: disability  Tobacco Use   Smoking status: Never   Smokeless tobacco: Never  Vaping Use   Vaping Use: Never used  Substance and Sexual Activity   Alcohol use: No   Drug use: No   Sexual activity: Not Currently    Partners: Male    Birth control/protection: Surgical    Comment: tubal  Other Topics Concern   Not on file  Social History Narrative   Married for 10 years, 3 children, all grown/teenagers.    2 daughters, 1 son - children live with her   Attends church.    Walks for exercise.   Enjoys singing.   Eats all food groups.   Wears seatbelt.   Born in [place], grew up in [place]. Father in [employer]. Grandmother raised her.    Moved from [place] in 2018.    Does not work out side of home.    Social Determinants of Health   Financial Resource Strain: Low Risk  (06/12/2022)   Overall Financial Resource Strain (CARDIA)    Difficulty of Paying Living  Expenses: Not hard at all  Food Insecurity: No Food Insecurity (06/12/2022)   Hunger Vital Sign    Worried About Running Out of Food in the Last Year: Never true    Ran Out of Food in the Last Year: Never true  Transportation Needs: No Transportation Needs (06/12/2022)   PRAPARE - Hydrologist (Medical): No    Lack of Transportation (Non-Medical): No  Physical Activity: Inactive (06/12/2022)   Exercise Vital  Sign    Days of Exercise per Week: 0 days    Minutes of Exercise per Session: 0 min  Stress: No Stress Concern Present (06/12/2022)   [place]    Feeling of Stress : Not at all  Social Connections: Moderately Isolated (06/12/2022)   Social Connection and Isolation Panel [NHANES]    Frequency of Communication with Friends and Family: More than three times a week    Frequency of Social Gatherings with Friends and Family: More than three times a week    Attends Religious Services: Never    Marine scientist or Organizations: No    Attends Archivist Meetings: Never    Marital Status: Married  Human resources officer Violence: Not At Risk (06/12/2022)   Humiliation, Afraid, Rape, and Kick questionnaire    Fear of Current or Ex-Partner: No    Emotionally Abused: No    Physically Abused: No    Sexually Abused: No    FAMILY HISTORY: Family History  Problem Relation Age of Onset   Diabetes Mother    Hypothyroidism Mother    Prostate cancer Father    Breast cancer Sister    Stroke Brother    Pancreatic cancer Brother     ALLERGIES:  is allergic to fish allergy, shrimp [shellfish allergy], asa [aspirin], and penicillins.  MEDICATIONS:  Current Outpatient Medications  Medication Sig Dispense Refill   atorvastatin (LIPITOR) 10 MG tablet Take 1 tablet (10 mg total) by mouth daily. 90 tablet 2   lisinopril (ZESTRIL) 2.5 MG tablet Take 1 tablet (2.5 mg total) by mouth daily. 90 tablet 2   metFORMIN (GLUCOPHAGE XR) 500 MG 24 hr tablet Take 1 tablet (500 mg total) by mouth daily with breakfast. 30 tablet 3   No current facility-administered medications for this visit.    REVIEW OF SYSTEMS:   Review of Systems  Constitutional:  Negative for appetite change, fatigue, fever and unexpected weight change.  HENT:   Positive for nosebleeds.   Respiratory:  Positive for cough (allergies).   Gastrointestinal:  Negative  for blood in stool.  Endocrine: Positive for hot flashes.  Genitourinary:  Negative for hematuria, menstrual problem (menopausal) and vaginal bleeding.   Musculoskeletal:  Positive for arthralgias (hands and knees).  Skin:  Negative for rash.  Neurological:  Positive for dizziness.  Hematological:  Bruises/bleeds easily.  All other systems reviewed and are negative.    PHYSICAL EXAMINATION: ECOG PERFORMANCE STATUS: 0 - Asymptomatic  Vitals:   07/09/22 0914  BP: 139/80  Pulse: 77  Resp: 18  Temp: (!) 97.4 F (36.3 C)  SpO2: 97%   Filed Weights   07/09/22 0914  Weight: 283 lb (128.4 kg)   Physical Exam Vitals reviewed.  Constitutional:      Appearance: Normal appearance. She is obese.  Cardiovascular:     Rate and Rhythm: Normal rate and regular rhythm.     Pulses: Normal pulses.     Heart sounds: Normal heart sounds.  Pulmonary:     Effort: Pulmonary effort is normal.     Breath sounds: Normal breath sounds.  Abdominal:     Palpations: Abdomen is soft. There is no hepatomegaly, splenomegaly or mass.     Tenderness: There is no abdominal tenderness.  Musculoskeletal:     Right lower leg: No edema.     Left lower leg: No edema.  Neurological:     General: No focal deficit present.     Mental Status: She is alert and oriented to person, place, and time.  Psychiatric:        Mood and Affect: Mood normal.        Behavior: Behavior normal.      LABORATORY DATA:  I have reviewed the data as listed Recent Results (from the past 2160 hour(s))  Microalbumin / creatinine urine ratio     Status: None   Collection Time: 06/04/22 10:30 AM  Result Value Ref Range   Creatinine, Urine 80.5 Not Estab. mg/dL   Microalbumin, Urine 6.1 Not Estab. ug/mL   Microalb/Creat Ratio 8 0 - 29 mg/g creat    Comment:                        Normal:                0 -  29                        Moderately increased: 30 - 300                        Severely increased:       >300    Bayer DCA Hb A1c Waived     Status: Abnormal   Collection Time: 06/04/22 10:31 AM  Result Value Ref Range   HB A1C (BAYER DCA - WAIVED) 5.9 (H) 4.8 - 5.6 %    Comment:          Prediabetes: 5.7 - 6.4          Diabetes: >6.4          Glycemic control for adults with diabetes: <7.0   CBC with Differential/Platelet     Status: Abnormal   Collection Time: 06/04/22 10:34 AM  Result Value Ref Range   WBC 6.0 3.4 - 10.8 x10E3/uL   RBC 4.70 3.77 - 5.28 x10E6/uL   Hemoglobin 14.3 11.1 - 15.9 g/dL   Hematocrit 42.3 34.0 - 46.6 %   MCV 90 79 - 97 fL   MCH 30.4 26.6 - 33.0 pg   MCHC 33.8 31.5 - 35.7 g/dL   RDW 13.1 11.7 - 15.4 %   Platelets 129 (L) 150 - 450 x10E3/uL   Neutrophils 56 Not Estab. %   Lymphs 30 Not Estab. %   Monocytes 8 Not Estab. %   Eos 4 Not Estab. %   Basos 1 Not Estab. %   Neutrophils Absolute 3.4 1.4 - 7.0 x10E3/uL   Lymphocytes Absolute 1.8 0.7 - 3.1 x10E3/uL   Monocytes Absolute 0.5 0.1 - 0.9 x10E3/uL   EOS (ABSOLUTE) 0.2 0.0 - 0.4 x10E3/uL   Basophils Absolute 0.1 0.0 - 0.2 x10E3/uL   Immature Granulocytes 1 Not Estab. %   Immature Grans (Abs) 0.0 0.0 - 0.1 x10E3/uL  CMP14+EGFR     Status: Abnormal   Collection Time: 06/04/22 10:34 AM  Result Value Ref Range   Glucose 117 (H) 70 -  99 mg/dL   BUN 13 6 - 24 mg/dL   Creatinine, Ser 0.80 0.57 - 1.00 mg/dL   eGFR 90 >59 mL/min/1.73   BUN/Creatinine Ratio 16 9 - 23   Sodium 140 134 - 144 mmol/L   Potassium 4.2 3.5 - 5.2 mmol/L   Chloride 103 96 - 106 mmol/L   CO2 21 20 - 29 mmol/L   Calcium 9.5 8.7 - 10.2 mg/dL   Total Protein 7.0 6.0 - 8.5 g/dL   Albumin 4.0 3.8 - 4.8 g/dL   Globulin, Total 3.0 1.5 - 4.5 g/dL   Albumin/Globulin Ratio 1.3 1.2 - 2.2   Bilirubin Total 0.4 0.0 - 1.2 mg/dL   Alkaline Phosphatase 93 44 - 121 IU/L   AST 30 0 - 40 IU/L   ALT 31 0 - 32 IU/L  Lipid panel     Status: Abnormal   Collection Time: 06/04/22 10:34 AM  Result Value Ref Range   Cholesterol, Total 238 (H) 100 - 199 mg/dL    Triglycerides 196 (H) 0 - 149 mg/dL   HDL 39 (L) >39 mg/dL   VLDL Cholesterol Cal 36 5 - 40 mg/dL   LDL Chol Calc (NIH) 163 (H) 0 - 99 mg/dL   Chol/HDL Ratio 6.1 (H) 0.0 - 4.4 ratio    Comment:                                   T. Chol/HDL Ratio                                             Men  Women                               1/2 Avg.Risk  3.4    3.3                                   Avg.Risk  5.0    4.4                                2X Avg.Risk  9.6    7.1                                3X Avg.Risk 23.4   11.0   Thyroid Panel With TSH     Status: Abnormal   Collection Time: 06/04/22 10:34 AM  Result Value Ref Range   TSH 3.810 0.450 - 4.500 uIU/mL   T4, Total 6.8 4.5 - 12.0 ug/dL   T3 Uptake Ratio 22 (L) 24 - 39 %   Free Thyroxine Index 1.5 1.2 - 4.9  Urinalysis, Routine w reflex microscopic     Status: Abnormal   Collection Time: 06/04/22 10:48 AM  Result Value Ref Range   Specific Gravity, UA 1.020 1.005 - 1.030   pH, UA 5.5 5.0 - 7.5   Color, UA Yellow Yellow   Appearance Ur Clear Clear   Leukocytes,UA Negative Negative   Protein,UA Negative Negative/Trace   Glucose, UA Negative Negative   Ketones, UA Negative Negative  RBC, UA Trace (A) Negative   Bilirubin, UA Negative Negative   Urobilinogen, Ur 0.2 0.2 - 1.0 mg/dL   Nitrite, UA Negative Negative   Microscopic Examination See below:   Microscopic Examination     Status: None   Collection Time: 06/04/22 10:48 AM   Urine  Result Value Ref Range   WBC, UA 0-5 0 - 5 /hpf   RBC, Urine 0-2 0 - 2 /hpf   Epithelial Cells (non renal) 0-10 0 - 10 /hpf   Bacteria, UA Few None seen/Few  CBC with Differential/Platelet     Status: Abnormal   Collection Time: 06/11/22 12:03 PM  Result Value Ref Range   WBC 7.0 3.4 - 10.8 x10E3/uL   RBC 4.74 3.77 - 5.28 x10E6/uL   Hemoglobin 14.4 11.1 - 15.9 g/dL   Hematocrit 42.5 34.0 - 46.6 %   MCV 90 79 - 97 fL   MCH 30.4 26.6 - 33.0 pg   MCHC 33.9 31.5 - 35.7 g/dL   RDW 13.5  11.7 - 15.4 %   Platelets 128 (L) 150 - 450 x10E3/uL   Neutrophils 64 Not Estab. %   Lymphs 24 Not Estab. %   Monocytes 9 Not Estab. %   Eos 3 Not Estab. %   Basos 0 Not Estab. %   Neutrophils Absolute 4.4 1.4 - 7.0 x10E3/uL   Lymphocytes Absolute 1.7 0.7 - 3.1 x10E3/uL   Monocytes Absolute 0.6 0.1 - 0.9 x10E3/uL   EOS (ABSOLUTE) 0.2 0.0 - 0.4 x10E3/uL   Basophils Absolute 0.0 0.0 - 0.2 x10E3/uL   Immature Granulocytes 0 Not Estab. %   Immature Grans (Abs) 0.0 0.0 - 0.1 x10E3/uL  Hepatitis B surface antibody     Status: None   Collection Time: 07/09/22 10:28 AM  Result Value Ref Range   Hep B S Ab NON REACTIVE NON REACTIVE    Comment: (NOTE) Inconsistent with immunity, less than 10 mIU/mL.  Performed at [facility], [place] [address], [place], [place] [address]   Hepatitis B core antibody, total     Status: None   Collection Time: 07/09/22 10:28 AM  Result Value Ref Range   Hep B Core Total Ab NON REACTIVE NON REACTIVE    Comment: Performed at [facility] [address], [place], [place] [address]  Hepatitis C Antibody     Status: None   Collection Time: 07/09/22 10:28 AM  Result Value Ref Range   HCV Ab NON REACTIVE NON REACTIVE    Comment: (NOTE) Nonreactive HCV antibody screen is consistent with no HCV infections,  unless recent infection is suspected or other evidence exists to indicate HCV infection.  Performed at [facility], [place] [address], [place], [place] [address]   Lactate dehydrogenase     Status: None   Collection Time: 07/09/22 10:28 AM  Result Value Ref Range   LDH 170 98 - 192 U/L    Comment: Performed at [facility], [address], [place], [place] [address]  CBC with Differential     Status: Abnormal   Collection Time: 07/09/22 10:28 AM  Result Value Ref Range   WBC 6.7 4.0 - 10.5 K/uL   RBC 4.51 3.87 - 5.11 MIL/uL   Hemoglobin 13.7 12.0 - 15.0 g/dL   HCT 42.3 36.0 - 46.0 %   MCV 93.8 80.0 - 100.0 fL   MCH 30.4 26.0 - 34.0 pg    MCHC 32.4 30.0 - 36.0 g/dL   RDW 13.7  11.5 - 15.5 %   Platelets 127 (L) 150 - 400 K/uL   nRBC 0.0 0.0 - 0.2 %   Neutrophils Relative % 56 %   Neutro Abs 3.7 1.7 - 7.7 K/uL   Lymphocytes Relative 32 %   Lymphs Abs 2.1 0.7 - 4.0 K/uL   Monocytes Relative 8 %   Monocytes Absolute 0.6 0.1 - 1.0 K/uL   Eosinophils Relative 3 %   Eosinophils Absolute 0.2 0.0 - 0.5 K/uL   Basophils Relative 1 %   Basophils Absolute 0.0 0.0 - 0.1 K/uL   Immature Granulocytes 0 %   Abs Immature Granulocytes 0.02 0.00 - 0.07 K/uL    Comment: Performed at [facility], [address], [place], [place] [address]  Reticulocytes     Status: Abnormal   Collection Time: 07/09/22 10:29 AM  Result Value Ref Range   Retic Ct Pct 2.2 0.4 - 3.1 %   RBC. 4.41 3.87 - 5.11 MIL/uL   Retic Count, Absolute 95.7 19.0 - 186.0 K/uL   Immature Retic Fract 23.9 (H) 2.3 - 15.9 %    Comment: Performed at [facility], [address], [place], [place] [address]  Vitamin B12     Status: None   Collection Time: 07/09/22 10:29 AM  Result Value Ref Range   Vitamin B-12 446 180 - 914 pg/mL    Comment: (NOTE) This assay is not validated for testing neonatal or myeloproliferative syndrome specimens for Vitamin B12 levels. Performed at [facility], [address], [place], [place] [address]   Hepatitis B surface antigen     Status: None   Collection Time: 07/09/22 10:29 AM  Result Value Ref Range   Hepatitis B Surface Ag NON REACTIVE NON REACTIVE    Comment: Performed at [facility] [address], [place],  [address]    RADIOGRAPHIC STUDIES: I have personally reviewed the radiological images as listed and agreed with the findings in the report. No results found.  ASSESSMENT:  Mild thrombocytopenia: - Patient evaluated for mild thrombocytopenia since 2019. - She reports nosebleeds 1-2 times per week only in the hot weather since 2017. - She also reports bruising on extremities. - Denies any bleeding per rectum  or melena.  She does not use any quinine supplements.  She attained menopause 2 years ago.  Prior to that she used to have heavy menses all her life. - Ultrasound of the abdomen in 2019 shows fatty liver.  Social/family history: - She is on disability.  Used to work at a [employer].  Non-smoker. - Sister had acute leukemia.  Another sister died of breast cancer.  Brother had pancreatic cancer at age 21.  Mother has rheumatoid arthritis.  Half brother had cancer in the liver.  Father died of stomach cancer.   PLAN:  Mild thrombocytopenia: - We discussed causes of mild thrombocytopenia including immune mediated thrombocytopenia. - We will repeat platelet count today.  We will check for nutritional deficiencies, connective tissue disorders and infectious etiologies. - We will also check ultrasound of the spleen to look for splenomegaly from fatty liver. - Because of her lifelong easy bruising and heavy menses and nosebleeds, will also check von Willebrand panel. - RTC 2 to 3 weeks for follow-up.   All questions were answered. The patient knows to call the clinic with any problems, questions or concerns.  Derek Jack, MD 07/09/22 5:33 PM  [place] [phone]   I, Thana Ates, am acting as a Education administrator  for Dr. Derek Jack.  I, Derek Jack MD, have reviewed the above documentation for accuracy and completeness, and I agree with the above.

## 2022-07-10 LAB — RHEUMATOID FACTOR: Rheumatoid fact SerPl-aCnc: 16.2 IU/mL — ABNORMAL HIGH (ref <14.0)

## 2022-07-11 ENCOUNTER — Ambulatory Visit (HOSPITAL_COMMUNITY)
Admission: RE | Admit: 2022-07-11 | Discharge: 2022-07-11 | Disposition: A | Discharge status: Home / Self Care | Payer: Medicare Other | Source: Ambulatory Visit | Attending: Family Medicine | Admitting: Family Medicine

## 2022-07-11 DIAGNOSIS — Z1231 Encounter for screening mammogram for malignant neoplasm of breast: Secondary | ICD-10-CM | POA: Insufficient documentation

## 2022-07-11 LAB — ANTINUCLEAR ANTIBODIES, IFA: ANA Ab, IFA: NEGATIVE

## 2022-07-11 LAB — METHYLMALONIC ACID, SERUM: Methylmalonic Acid, Quantitative: 114 nmol/L (ref 0–378)

## 2022-07-12 ENCOUNTER — Other Ambulatory Visit: Payer: Self-pay

## 2022-07-12 DIAGNOSIS — R928 Other abnormal and inconclusive findings on diagnostic imaging of breast: Secondary | ICD-10-CM

## 2022-07-12 LAB — COPPER, SERUM: Copper: 132 ug/dL (ref 80–158)

## 2022-07-12 NOTE — Progress Notes (Signed)
Orders placed and sent to [facility]

## 2022-07-17 ENCOUNTER — Ambulatory Visit (HOSPITAL_COMMUNITY)
Admission: RE | Admit: 2022-07-17 | Discharge: 2022-07-17 | Disposition: A | Discharge status: Home / Self Care | Payer: Medicare Other | Source: Ambulatory Visit | Attending: Hematology | Admitting: Hematology

## 2022-07-17 DIAGNOSIS — D696 Thrombocytopenia, unspecified: Secondary | ICD-10-CM | POA: Insufficient documentation

## 2022-07-18 LAB — VON WILLEBRAND PANEL
Coagulation Factor VIII: 123 % (ref 56–140)
Ristocetin Co-factor, Plasma: 94 % (ref 50–200)
Von Willebrand Antigen, Plasma: 142 % (ref 50–200)

## 2022-07-18 LAB — H PYLORI, IGM, IGG, IGA AB
H Pylori IgG: 1.21 Index Value — ABNORMAL HIGH (ref 0.00–0.79)
H. Pylogi, Iga Abs: 9 units — ABNORMAL HIGH (ref 0.0–8.9)
H. Pylogi, Igm Abs: <9 units (ref 0.0–8.9)

## 2022-07-18 LAB — COAG STUDIES INTERP REPORT

## 2022-07-19 ENCOUNTER — Encounter (HOSPITAL_COMMUNITY): Payer: Medicare Other | Admitting: Hematology

## 2022-07-23 ENCOUNTER — Ambulatory Visit (INDEPENDENT_AMBULATORY_CARE_PROVIDER_SITE_OTHER): Payer: Medicare Other | Admitting: "Endocrinology

## 2022-07-23 ENCOUNTER — Encounter: Payer: Self-pay | Admitting: "Endocrinology

## 2022-07-23 VITALS — BP 116/78 | HR 68 | Ht 65.0 in | Wt 283.2 lb

## 2022-07-23 DIAGNOSIS — E1159 Type 2 diabetes mellitus with other circulatory complications: Secondary | ICD-10-CM

## 2022-07-23 DIAGNOSIS — E785 Hyperlipidemia, unspecified: Secondary | ICD-10-CM | POA: Diagnosis not present

## 2022-07-23 DIAGNOSIS — E1169 Type 2 diabetes mellitus with other specified complication: Secondary | ICD-10-CM

## 2022-07-23 DIAGNOSIS — E559 Vitamin D deficiency, unspecified: Secondary | ICD-10-CM

## 2022-07-23 DIAGNOSIS — I152 Hypertension secondary to endocrine disorders: Secondary | ICD-10-CM | POA: Diagnosis not present

## 2022-07-23 MED ORDER — VITAMIN D3 125 MCG (5000 UT) PO CAPS: 5000.0000 [IU] | ORAL_CAPSULE | Freq: Every day | ORAL | 1 refills | Status: DC

## 2022-07-23 NOTE — Patient Instructions (Signed)

## 2022-07-23 NOTE — Progress Notes (Signed)
Endocrinology Consult Note       07/23/2022, 1:08 PM   Subjective:    Patient ID: Paula Butler, female    DOB: [birth year].  Paula Butler is being seen in consultation for management of currently uncontrolled symptomatic diabetes requested by  Sonny Masters, FNP.   Past Medical History:  Diagnosis Date   Diabetes mellitus without complication (HCC)    Hyperlipidemia    Hypertension     Past Surgical History:  Procedure Laterality Date   CHOLECYSTECTOMY     ESOPHAGOGASTRODUODENOSCOPY  06/14/2009   Dr. Otho Najjar; mild esophagitis biopsied, small gastric ulcer-healing s/p biopsied, gastritis biopsied, mild duodenitis biopsied.  Esophageal biopsy unremarkable, gastric and duodenal biopsy with no significant histopathologic features.   EXCISION OF BREAST BIOPSY Left 05/29/2018   Procedure: NIPPLE EXPLORATION/EXCISION RETROAREOLA TISSUE LEFT BREAST;  Surgeon: Lucretia Roers, MD;  Location: [facility];  Service: General;  Laterality: Left;   TUBAL LIGATION      Social History   Socioeconomic History   Marital status: Married    Spouse name: Jose   Number of children: 3   Years of education: Not on file   Highest education level: Not on file  Occupational History   Occupation: disability  Tobacco Use   Smoking status: Never   Smokeless tobacco: Never  Vaping Use   Vaping Use: Never used  Substance and Sexual Activity   Alcohol use: No   Drug use: No   Sexual activity: Not Currently    Partners: Male    Birth control/protection: Surgical    Comment: tubal  Other Topics Concern   Not on file  Social History Narrative   Married for 10 years, 3 children, all grown/teenagers.    2 daughters, 1 son - children live with her   Attends church.    Walks for exercise.   Enjoys singing.   Eats all food groups.   Wears seatbelt.   Born in [place], grew up in [place]. Father in [employer] [place] [employer].  Grandmother raised her.    Moved from [place] in 2018.    Does not work out side of home.    Social Determinants of Health   Financial Resource Strain: Low Risk  (06/12/2022)   Overall Financial Resource Strain (CARDIA)    Difficulty of Paying Living Expenses: Not hard at all  Food Insecurity: No Food Insecurity (06/12/2022)   Hunger Vital Sign    Worried About Running Out of Food in the Last Year: Never true    Ran Out of Food in the Last Year: Never true  Transportation Needs: No Transportation Needs (06/12/2022)   PRAPARE - Administrator, Civil Service (Medical): No    Lack of Transportation (Non-Medical): No  Physical Activity: Inactive (06/12/2022)   Exercise Vital Sign    Days of Exercise per Week: 0 days    Minutes of Exercise per Session: 0 min  Stress: No Stress Concern Present (06/12/2022)   [employer] of Occupational Health - Occupational Stress Questionnaire    Feeling of Stress : Not at all  Social Connections: Moderately Isolated (06/12/2022)   Social Connection and  Isolation Panel [NHANES]    Frequency of Communication with Friends and Family: More than three times a week    Frequency of Social Gatherings with Friends and Family: More than three times a week    Attends Religious Services: Never    Database administrator or Organizations: No    Attends Engineer, structural: Never    Marital Status: Married    Family History  Problem Relation Age of Onset   Diabetes Mother    Hypothyroidism Mother    Hyperlipidemia Mother    Hyperlipidemia Father    Hypertension Father    Diabetes Father    Prostate cancer Father    Breast cancer Sister    Stroke Brother    Pancreatic cancer Brother     Outpatient Encounter Medications as of 07/23/2022  Medication Sig   Cholecalciferol (VITAMIN D3) 125 MCG (5000 UT) CAPS Take 1 capsule (5,000 Units total) by mouth daily.   atorvastatin (LIPITOR) 10 MG tablet Take 1 tablet (10 mg total) by mouth  daily.   lisinopril (ZESTRIL) 2.5 MG tablet Take 1 tablet (2.5 mg total) by mouth daily.   metFORMIN (GLUCOPHAGE XR) 500 MG 24 hr tablet Take 1 tablet (500 mg total) by mouth daily with breakfast.   No facility-administered encounter medications on file as of 07/23/2022.    ALLERGIES: Allergies  Allergen Reactions   Fish Allergy Anaphylaxis, Shortness Of Breath and Rash   Shrimp [Shellfish Allergy] Anaphylaxis, Shortness Of Breath and Rash   Asa [Aspirin] Other (See Comments)    Abdominal swelling    Penicillins Hives, Itching and Rash    Has patient had a PCN reaction causing immediate rash, facial/tongue/throat swelling, SOB or lightheadedness with hypotension: Yes Has patient had a PCN reaction causing severe rash involving mucus membranes or skin necrosis: No Has patient had a PCN reaction that required hospitalization: Yes Has patient had a PCN reaction occurring within the last 10 years: Yes If all of the above answers are "NO", then may proceed with Cephalosporin use.     VACCINATION STATUS: Immunization History  Administered Date(s) Administered   Influenza,inj,Quad PF,6+ Mos 10/01/2018   Pneumococcal Conjugate-13 02/27/2018   Td 07/14/2020   Tdap 02/27/2018    Diabetes She presents for her initial diabetic visit. She has type 2 diabetes mellitus. Onset time: She was diagnosed approximate age of 42 years. Her disease course has been stable. There are no hypoglycemic associated symptoms. Pertinent negatives for hypoglycemia include no confusion, headaches, pallor or seizures. There are no diabetic associated symptoms. Pertinent negatives for diabetes include no chest pain, no polydipsia, no polyphagia and no polyuria. There are no hypoglycemic complications. Symptoms are stable. There are no diabetic complications. Risk factors for coronary artery disease include diabetes mellitus, dyslipidemia, family history, obesity, hypertension and sedentary lifestyle. Current diabetic  treatments: She is on metformin 500 mg p.o. daily. Her weight is increasing steadily. She is following a generally unhealthy diet. When asked about meal planning, she reported none. She has not had a previous visit with a dietitian. She rarely participates in exercise. (She does not monitor blood glucose regularly.  Her recent A1c was 5.9%.) An ACE inhibitor/angiotensin II receptor blocker is being taken.  Hyperlipidemia This is a chronic problem. The current episode started more than 1 year ago. The problem is uncontrolled. Exacerbating diseases include diabetes and obesity. Pertinent negatives include no chest pain, myalgias or shortness of breath. Current antihyperlipidemic treatment includes statins. Risk factors for coronary artery disease include  diabetes mellitus, dyslipidemia, hypertension, obesity, a sedentary lifestyle and family history.  Hypertension This is a chronic problem. Pertinent negatives include no chest pain, headaches, palpitations or shortness of breath. Risk factors for coronary artery disease include family history, dyslipidemia, diabetes mellitus, obesity and sedentary lifestyle. Past treatments include ACE inhibitors. The current treatment provides moderate improvement.     Review of Systems  Constitutional:  Negative for chills, fever and unexpected weight change.  HENT:  Negative for trouble swallowing and voice change.   Eyes:  Negative for visual disturbance.  Respiratory:  Negative for cough, shortness of breath and wheezing.   Cardiovascular:  Negative for chest pain, palpitations and leg swelling.  Gastrointestinal:  Negative for diarrhea, nausea and vomiting.  Endocrine: Negative for cold intolerance, heat intolerance, polydipsia, polyphagia and polyuria.  Musculoskeletal:  Negative for arthralgias and myalgias.  Skin:  Negative for color change, pallor, rash and wound.  Neurological:  Negative for seizures and headaches.  Psychiatric/Behavioral:  Negative for  confusion and suicidal ideas.     Objective:       07/23/2022    9:04 AM 07/09/2022    9:14 AM 06/12/2022    2:23 PM  Vitals with BMI  Height     Weight 283 lbs 3 oz 283 lbs 283 lbs  BMI 47.13 47.09 47.09  Systolic 116 139   Diastolic 78 80   Pulse 68 77     BP 116/78   Pulse 68   Ht  (1.651 m)   Wt 283 lb 3.2 oz (128.5 kg)   BMI 47.13 kg/m   Wt Readings from Last 3 Encounters:  07/23/22 283 lb 3.2 oz (128.5 kg)  07/09/22 283 lb (128.4 kg)  06/12/22 283 lb (128.4 kg)     Physical Exam Constitutional:      Appearance: She is well-developed.  HENT:     Head: Normocephalic and atraumatic.  Neck:     Thyroid: No thyromegaly.     Trachea: No tracheal deviation.  Cardiovascular:     Rate and Rhythm: Normal rate and regular rhythm.  Pulmonary:     Effort: Pulmonary effort is normal.  Abdominal:     Tenderness: There is no abdominal tenderness. There is no guarding.  Musculoskeletal:        General: Normal range of motion.     Cervical back: Normal range of motion and neck supple.  Skin:    General: Skin is warm and dry.     Coloration: Skin is not pale.     Findings: No erythema or rash.  Neurological:     Mental Status: She is alert and oriented to person, place, and time.     Cranial Nerves: No cranial nerve deficit.     Coordination: Coordination normal.     Deep Tendon Reflexes: Reflexes are normal and symmetric.  Psychiatric:        Judgment: Judgment normal.       CMP ( most recent) CMP     Component Value Date/Time   NA 140 06/04/2022 1034   K 4.2 06/04/2022 1034   CL 103 06/04/2022 1034   CO2 21 06/04/2022 1034   GLUCOSE 117 (H) 06/04/2022 1034   GLUCOSE 106 (H) 05/25/2018 0814   BUN 13 06/04/2022 1034   CREATININE 0.80 06/04/2022 1034   CREATININE 0.97 02/27/2018 1219   CALCIUM 9.5 06/04/2022 1034   PROT 7.0 06/04/2022 1034   ALBUMIN 4.0 06/04/2022 1034   AST 30 06/04/2022 1034  ALT 31 06/04/2022 1034   ALKPHOS 93  06/04/2022 1034   BILITOT 0.4 06/04/2022 1034   GFRNONAA >60 05/25/2018 0814   GFRNONAA 71 02/27/2018 1219   GFRAA >60 05/25/2018 0814   GFRAA 82 02/27/2018 1219     Diabetic Labs (most recent): Lab Results  Component Value Date   HGBA1C 5.9 (H) 06/04/2022   HGBA1C 5.8 01/19/2019   HGBA1C 5.7 (H) 05/25/2018   MICROALBUR 0.8 02/27/2018     Lipid Panel ( most recent) Lipid Panel     Component Value Date/Time   CHOL 238 (H) 06/04/2022 1034   TRIG 196 (H) 06/04/2022 1034   HDL 39 (L) 06/04/2022 1034   CHOLHDL 6.1 (H) 06/04/2022 1034   CHOLHDL 5.6 (H) 03/24/2018 1240   LDLCALC 163 (H) 06/04/2022 1034   LDLCALC 126 (H) 03/24/2018 1240   LABVLDL 36 06/04/2022 1034      Lab Results  Component Value Date   TSH 3.810 06/04/2022   TSH 3.81 01/19/2019   TSH 3.48 02/27/2018      Assessment & Plan:   1. Type 2 diabetes mellitus with other specified complication, without long-term current use of insulin (HCC)  - Paula Butler has currently controlled symptomatic type 2 DM since  51 years of age,  with most recent A1c of 5.9 %. Recent labs reviewed. - I had a long discussion with her about the progressive nature of diabetes and the pathology behind its complications. -her diabetes is complicated by obesity, hyperlipidemia, hypertension and she remains at a high risk for more acute and chronic complications which include CAD, CVA, CKD, retinopathy, and neuropathy.  She is also at risk for MASLD in light of her severe hyperlipidemia.  These are all discussed in detail with her.  - I discussed all available options of managing her diabetes including de-escalation of medications. I have counseled her on diet  and weight management  by adopting a Whole Food , Plant Predominant  ( WFPP) nutrition as recommended by [employer] of Lifestyle Medicine. Patient is encouraged to switch to  unprocessed or minimally processed  complex starch, adequate protein intake (mainly plant  source), minimal liquid fat ( mainly vegetable oils), plenty of fruits, and vegetables. -  she is advised to stick to a routine mealtimes to eat 3 complete meals a day and snack only when necessary ( to snack only to correct hypoglycemia BG <70 day time or <100 at night).   - she acknowledges that there is a room for improvement in her food and drink choices. - Further Specific Suggestion is made for her to avoid simple carbohydrates  from her diet including Cakes, Sweet Desserts, Ice Cream, Soda (diet and regular), Sweet Tea, Candies, Chips, Cookies, Store Bought Juices, Alcohol ,  Artificial Sweeteners,  Coffee Creamer, and "Sugar-free" Products. This will help patient to have more stable blood glucose profile and potentially avoid unintended weight gain.  The following Lifestyle Medicine recommendations according to American College of Lifestyle Medicine [facility] were discussed and offered to patient and she agrees to start the journey:  A. Whole Foods, Plant-based plate comprising of fruits and vegetables, plant-based proteins, whole-grain carbohydrates was discussed in detail with the patient.   A list for source of those nutrients were also provided to the patient.  Patient will use only water or unsweetened tea for hydration. B.  The need to stay away from risky substances including alcohol, smoking; obtaining 7 to 9 hours of restorative sleep, at least 150 minutes  of moderate intensity exercise weekly, the importance of healthy social connections,  and stress reduction techniques were discussed. C.  A full color page of  Calorie density of various food groups per pound showing examples of each food groups was provided to the patient.  - she will be scheduled with Norm SaltPenny Crumpton, RDN, CDE for individualized diabetes education.  - I have approached her with the following plan to manage  her diabetes and patient agrees:   -In light of her target A1c of 5.9%, she will not need additional  pharmaceutical treatment at this time.  She is advised to continue metformin 500 mg XR p.o. daily at breakfast.  The most effective intervention for her would be lifestyle medicine as described above. - she will be considered for incretin therapy as appropriate next visit.  - Specific targets for  A1c;  LDL, HDL,  and Triglycerides were discussed with the patient.  2) Blood Pressure /Hypertension:  her blood pressure is  controlled to target.   she is advised to continue her current medications including lisinopril 2.5 mg p.o. daily with breakfast . 3) Lipids/Hyperlipidemia:   Review of her recent lipid panel showed un controlled  LDL at 163 .  she  is advised to continue    atorvastatin 10 mg daily at bedtime.  Side effects and precautions discussed with her.  4)  Weight/Diet:  Body mass index is 47.13 kg/m.  -   clearly complicating her diabetes care.   she is  a candidate for weight loss. I discussed with her the fact that loss of 5 - 10% of her  current body weight will have the most impact on her diabetes management.  The above detailed  ACLM recommendations for nutrition, exercise, sleep, social life, avoidance of risky substances, the need for restorative sleep   information will also detailed on discharge instructions.  5) Chronic Care/Health Maintenance:  -she  5 on ACEI/ARB and Statin medications and  is encouraged to initiate and continue to follow up with Ophthalmology, Dentist,  Podiatrist at least yearly or according to recommendations, and advised to   stay away from smoking. I have recommended yearly flu vaccine and pneumonia vaccine at least every 5 years; moderate intensity exercise for up to 150 minutes weekly; and  sleep for 7- 9 hours a day. She will have vitamin D checked before her next visit. - she is  advised to maintain close follow up with Rakes, Doralee AlbinoLinda M, FNP for primary care needs, as well as her other providers for optimal and coordinated care.   I spent 65 minutes in  the care of the patient today including review of labs from CMP, Lipids, Thyroid Function, Hematology (current and previous including abstractions from other facilities); face-to-face time discussing  her blood glucose readings/logs, discussing hypoglycemia and hyperglycemia episodes and symptoms, medications doses, her options of short and long term treatment based on the latest standards of care / guidelines;  discussion about incorporating lifestyle medicine;  and documenting the encounter. Risk reduction counseling performed per USPSTF guidelines to reduce obesity and cardiovascular risk factors.      Please refer to Patient Instructions for Blood Glucose Monitoring and Insulin/Medications Dosing Guide"  in media tab for additional information. Please  also refer to " Patient Self Inventory" in the Media  tab for reviewed elements of pertinent patient history.  Paula FoersterAlicia A Butler participated in the discussions, expressed understanding, and voiced agreement with the above plans.  All questions were answered to her satisfaction.  she is encouraged to contact clinic should she have any questions or concerns prior to her return visit.   Follow up plan: - Return in about 3 months (around 10/23/2022) for Fasting Labs  in AM B4 8, A1c -NV.  Marquis Lunch, MD [facility] [address] [place], [place] [address] Phone: [phone]  Fax: [phone]    07/23/2022, 1:08 PM  This note was partially dictated with voice recognition software. Similar sounding words can be transcribed inadequately or may not  be corrected upon review.

## 2022-07-24 NOTE — Progress Notes (Unsigned)
[facility] [address], [place] [address]   CLINIC:  Medical Oncology/Hematology  PCP:  Sonny Masters, FNP [address] [place] [address] [phone]   REASON FOR VISIT:  Follow-up for mild thrombocytopenia  PRIOR THERAPY: None  CURRENT THERAPY: Surveillance  INTERVAL HISTORY:  [name] 51 y.o. female returns for routine follow-up of her mild thrombocytopenia.  She was seen for initial consultation by Dr. Ellin Saba on 07/09/2022.  At today's visit, she reports feeling fairly well.  She denies any changes in her baseline health status since her initial visit with Dr. Ellin Saba 2 weeks ago.  She reports nosebleeds during hot weather occurring about 1-2 times weekly since 2017; the nosebleeds stop when pressure is applied. She stopped menstruating 2 years ago. She denies vaginal bleeding, spotting, hematochezia, and black stools. She reports she has bruised easily on her arms and hand for her whole life, and she denies spontaneous bruising. When she had menses she reports her bleeding was heavy and lasted for 6-9 days. She denies taking any over the counter herbal supplements, quinine, or excessive eating of walnuts. She denies skin rash, fevers, night sweats, and weight loss. She reports hot flashes since entering menopause. She reports joint pains in her hands and knees.   She has 100% energy and 100% appetite. She endorses that she is maintaining a stable weight.   REVIEW OF SYSTEMS:  Review of Systems  Constitutional:  Negative for appetite change, chills, diaphoresis, fatigue, fever and unexpected weight change.  HENT:   Negative for lump/mass and nosebleeds.   Eyes:  Negative for eye problems.  Respiratory:  Negative for cough, hemoptysis and shortness of breath.   Cardiovascular:  Negative for chest pain, leg swelling and palpitations.  Gastrointestinal:  Negative for abdominal pain, blood in stool, constipation, diarrhea, nausea and  vomiting.  Genitourinary:  Negative for hematuria.   Skin: Negative.   Neurological:  Positive for dizziness. Negative for headaches and light-headedness.  Hematological:  Does not bruise/bleed easily.  Psychiatric/Behavioral:  The patient is nervous/anxious.      PAST MEDICAL/SURGICAL HISTORY:  Past Medical History:  Diagnosis Date   Diabetes mellitus without complication (HCC)    Hyperlipidemia    Hypertension    Past Surgical History:  Procedure Laterality Date   CHOLECYSTECTOMY     ESOPHAGOGASTRODUODENOSCOPY  06/14/2009   Dr. Otho Najjar; mild esophagitis biopsied, small gastric ulcer-healing s/p biopsied, gastritis biopsied, mild duodenitis biopsied.  Esophageal biopsy unremarkable, gastric and duodenal biopsy with no significant histopathologic features.   EXCISION OF BREAST BIOPSY Left 05/29/2018   Procedure: NIPPLE EXPLORATION/EXCISION RETROAREOLA TISSUE LEFT BREAST;  Surgeon: Lucretia Roers, MD;  Location: [facility];  Service: General;  Laterality: Left;   TUBAL LIGATION       SOCIAL HISTORY:  Social History   Socioeconomic History   Marital status: Married    Spouse name: Jose   Number of children: 3   Years of education: Not on file   Highest education level: Not on file  Occupational History   Occupation: disability  Tobacco Use   Smoking status: Never   Smokeless tobacco: Never  Vaping Use   Vaping Use: Never used  Substance and Sexual Activity   Alcohol use: No   Drug use: No   Sexual activity: Not Currently    Partners: Male    Birth control/protection: Surgical    Comment: tubal  Other Topics Concern   Not on file  Social History Narrative  Married for 10 years, 3 children, all grown/teenagers.    2 daughters, 1 son - children live with her   Attends church.    Walks for exercise.   Enjoys singing.   Eats all food groups.   Wears seatbelt.   Born in [place], grew up in [place]. Father in [employer]. Grandmother raised her.    Moved from [place] in  [place].    Does not work out side of home.    Social Determinants of Health   Financial Resource Strain: Low Risk  (06/12/2022)   Overall Financial Resource Strain (CARDIA)    Difficulty of Paying Living Expenses: Not hard at all  Food Insecurity: No Food Insecurity (06/12/2022)   Hunger Vital Sign    Worried About Running Out of Food in the Last Year: Never true    Ran Out of Food in the Last Year: Never true  Transportation Needs: No Transportation Needs (06/12/2022)   PRAPARE - Administrator, Civil Service (Medical): No    Lack of Transportation (Non-Medical): No  Physical Activity: Inactive (06/12/2022)   Exercise Vital Sign    Days of Exercise per Week: 0 days    Minutes of Exercise per Session: 0 min  Stress: No Stress Concern Present (06/12/2022)   [employer] of Occupational Health - Occupational Stress Questionnaire    Feeling of Stress : Not at all  Social Connections: Moderately Isolated (06/12/2022)   Social Connection and Isolation Panel [NHANES]    Frequency of Communication with Friends and Family: More than three times a week    Frequency of Social Gatherings with Friends and Family: More than three times a week    Attends Religious Services: Never    Database administrator or Organizations: No    Attends Banker Meetings: Never    Marital Status: Married  Catering manager Violence: Not At Risk (06/12/2022)   Humiliation, Afraid, Rape, and Kick questionnaire    Fear of Current or Ex-Partner: No    Emotionally Abused: No    Physically Abused: No    Sexually Abused: No    FAMILY HISTORY:  Family History  Problem Relation Age of Onset   Diabetes Mother    Hypothyroidism Mother    Hyperlipidemia Mother    Hyperlipidemia Father    Hypertension Father    Diabetes Father    Prostate cancer Father    Breast cancer Sister    Stroke Brother    Pancreatic cancer Brother     CURRENT MEDICATIONS:  Outpatient Encounter Medications as of  07/25/2022  Medication Sig   atorvastatin (LIPITOR) 10 MG tablet Take 1 tablet (10 mg total) by mouth daily.   Cholecalciferol (VITAMIN D3) 125 MCG (5000 UT) CAPS Take 1 capsule (5,000 Units total) by mouth daily.   lisinopril (ZESTRIL) 2.5 MG tablet Take 1 tablet (2.5 mg total) by mouth daily.   metFORMIN (GLUCOPHAGE XR) 500 MG 24 hr tablet Take 1 tablet (500 mg total) by mouth daily with breakfast.   No facility-administered encounter medications on file as of 07/25/2022.    ALLERGIES:  Allergies  Allergen Reactions   Fish Allergy Anaphylaxis, Shortness Of Breath and Rash   Shrimp [Shellfish Allergy] Anaphylaxis, Shortness Of Breath and Rash   Asa [Aspirin] Other (See Comments)    Abdominal swelling    Penicillins Hives, Itching and Rash    Has patient had a PCN reaction causing immediate rash, facial/tongue/throat swelling, SOB or lightheadedness with hypotension: Yes Has patient had  a PCN reaction causing severe rash involving mucus membranes or skin necrosis: No Has patient had a PCN reaction that required hospitalization: Yes Has patient had a PCN reaction occurring within the last 10 years: Yes If all of the above answers are "NO", then may proceed with Cephalosporin use.      PHYSICAL EXAM:  ECOG PERFORMANCE STATUS: 0 - Asymptomatic  There were no vitals filed for this visit. There were no vitals filed for this visit. Physical Exam Constitutional:      Appearance: Normal appearance. She is morbidly obese.  HENT:     Head: Normocephalic and atraumatic.     Mouth/Throat:     Mouth: Mucous membranes are moist.  Eyes:     Extraocular Movements: Extraocular movements intact.     Pupils: Pupils are equal, round, and reactive to light.  Cardiovascular:     Rate and Rhythm: Normal rate and regular rhythm.     Pulses: Normal pulses.     Heart sounds: Normal heart sounds.  Pulmonary:     Effort: Pulmonary effort is normal.     Breath sounds: Normal breath sounds.   Abdominal:     General: Bowel sounds are normal.     Palpations: Abdomen is soft.     Tenderness: There is no abdominal tenderness.  Musculoskeletal:        General: No swelling.     Right lower leg: No edema.     Left lower leg: No edema.  Lymphadenopathy:     Cervical: No cervical adenopathy.  Skin:    General: Skin is warm and dry.  Neurological:     General: No focal deficit present.     Mental Status: She is alert and oriented to person, place, and time.  Psychiatric:        Mood and Affect: Mood normal.        Behavior: Behavior normal.      LABORATORY DATA:  I have reviewed the labs as listed.  CBC    Component Value Date/Time   WBC 6.7 07/09/2022 1028   RBC 4.41 07/09/2022 1029   RBC 4.51 07/09/2022 1028   HGB 13.7 07/09/2022 1028   HGB 14.4 06/11/2022 1203   HCT 42.3 07/09/2022 1028   HCT 42.5 06/11/2022 1203   PLT 127 (L) 07/09/2022 1028   PLT 128 (L) 06/11/2022 1203   MCV 93.8 07/09/2022 1028   MCV 90 06/11/2022 1203   MCH 30.4 07/09/2022 1028   MCHC 32.4 07/09/2022 1028   RDW 13.7 07/09/2022 1028   RDW 13.5 06/11/2022 1203   LYMPHSABS 2.1 07/09/2022 1028   LYMPHSABS 1.7 06/11/2022 1203   MONOABS 0.6 07/09/2022 1028   EOSABS 0.2 07/09/2022 1028   EOSABS 0.2 06/11/2022 1203   BASOSABS 0.0 07/09/2022 1028   BASOSABS 0.0 06/11/2022 1203      Latest Ref Rng & Units 06/04/2022   10:34 AM 01/19/2019   12:00 AM 05/25/2018    8:14 AM  CMP  Glucose 70 - 99 mg/dL 737   106   BUN 6 - 24 mg/dL 13  16     16    Creatinine 0.57 - 1.00 mg/dL  0.9     2.69   Sodium 134 - 144 mmol/L 140   142   Potassium 3.5 - 5.2 mmol/L 4.2   3.9   Chloride 96 - 106 mmol/L 103   108   CO2 20 - 29 mmol/L 21   26   Calcium 8.7 -  10.2 mg/dL 9.5   9.3   Total Protein 6.0 - 8.5 g/dL 7.0     Total Bilirubin 0.0 - 1.2 mg/dL 0.4     Alkaline Phos 44 - 121 IU/L 93     AST 0 - 40 IU/L 30     ALT 0 - 32 IU/L 31        This result is from an external source.    DIAGNOSTIC  IMAGING:  I have independently reviewed the relevant imaging and discussed with the patient.  ASSESSMENT & PLAN: 1.  Mild thrombocytopenia - Patient evaluated for mild thrombocytopenia since 2019. - She reports nosebleeds 1-2 times per week only in the hot weather since 2017. - She also reports bruising on extremities. - Denies any bleeding per rectum or melena.  She does not use any quinine supplements.  She attained menopause 2 years ago.  Prior to that she used to have heavy menses all her life. - Ultrasound of the abdomen in 2019 shows fatty liver.  Splenic ultrasound (07/17/2022) shows spleen measuring 8.4 x 6.1 x 12.6 cm with splenic volume 335 cc. - Hematology workup (07/09/2022): Hepatitis B and hepatitis C negative.  H. pylori IgA and IgG antibodies positive. Normal B12, folate, methylmalonic acid, copper Normal reticulocytes, LDH.  Negative ANA.  Rheumatoid factor weakly positive at 16.2. Von Willebrand panel negative. - Most recent CBC (07/09/2022): Platelets 127, otherwise normal CBC -Differential diagnosis favors mild thrombocytopenia in the setting of fatty liver disease.  Differential also includes immune mediated thrombocytopenia. - PLAN: No indication for treatment of thrombocytopenia at this time.  We will continue to monitor with repeat CBC and office visit in 6 months.  2.  Social/family history: - She is on disability.  Used to work at a [employer].  Non-smoker. - Sister had acute leukemia.  Another sister died of breast cancer.  Brother had pancreatic cancer at age 91.  Mother has rheumatoid arthritis.  Half brother had cancer in the liver.  Father died of stomach cancer.  All questions were answered. The patient knows to call the clinic with any problems, questions or concerns.  Medical decision making: Low  Time spent on visit: I spent 15 minutes counseling the patient face to face. The total time spent in the appointment was 25 minutes and more than 50% was on  counseling.   Carnella Guadalajara, PA-C  07/25/22 9:04 AM

## 2022-07-25 ENCOUNTER — Inpatient Hospital Stay: Payer: Medicare Other | Attending: Physician Assistant | Admitting: Physician Assistant

## 2022-07-25 VITALS — BP 125/81 | HR 70 | Temp 97.8°F | Resp 18 | Ht 65.0 in | Wt 279.3 lb

## 2022-07-25 DIAGNOSIS — D696 Thrombocytopenia, unspecified: Secondary | ICD-10-CM | POA: Insufficient documentation

## 2022-07-25 DIAGNOSIS — Z79899 Other long term (current) drug therapy: Secondary | ICD-10-CM | POA: Insufficient documentation

## 2022-07-25 DIAGNOSIS — E785 Hyperlipidemia, unspecified: Secondary | ICD-10-CM | POA: Insufficient documentation

## 2022-07-25 DIAGNOSIS — R232 Flushing: Secondary | ICD-10-CM | POA: Insufficient documentation

## 2022-07-25 DIAGNOSIS — E119 Type 2 diabetes mellitus without complications: Secondary | ICD-10-CM | POA: Diagnosis not present

## 2022-07-25 DIAGNOSIS — K760 Fatty (change of) liver, not elsewhere classified: Secondary | ICD-10-CM

## 2022-07-25 DIAGNOSIS — M2550 Pain in unspecified joint: Secondary | ICD-10-CM | POA: Insufficient documentation

## 2022-07-25 DIAGNOSIS — Z7984 Long term (current) use of oral hypoglycemic drugs: Secondary | ICD-10-CM | POA: Diagnosis not present

## 2022-07-25 DIAGNOSIS — I10 Essential (primary) hypertension: Secondary | ICD-10-CM | POA: Diagnosis not present

## 2022-07-25 NOTE — Patient Instructions (Signed)
[facility] at [facility] **VISIT SUMMARY & IMPORTANT INSTRUCTIONS **   You were seen today by Rojelio Brenner PA-C for your low platelets.    LOW PLATELETS Your low platelets are most likely related to underlying "fatty liver disease." You may also have some mild immune system dysfunction, meaning that your body will attack some of your platelets and cause them to be low. At this time, your platelets are only mildly low and do not require any specific treatment. We will continue to monitor your low platelets with repeat labs and office visit in 6 months.  FATTY LIVER DISEASE: This was seen on abdominal ultrasound and is when fatty tissue can infiltrate the liver.  This may be what is causing your platelets to be low.  The following instructions can help to decrease your fatty liver disease. Recommend 1-2# weight loss per week until ideal body weight through exercise & diet. Low fat/cholesterol diet.   Avoid sweets, sodas, fruit juices, sweetened beverages like tea, etc. Gradually increase exercise from 15 min daily up to 1 hr per day 5 days/week. Limit alcohol use.   FOLLOW-UP APPOINTMENT: Same-day labs and office visit in 6 months  ** Thank you for trusting me with your healthcare!  I strive to provide all of my patients with quality care at each visit.  If you receive a survey for this visit, I would be so grateful to you for taking the time to provide feedback.  Thank you in advance!  ~ Gotti Alwin                   Dr. Doreatha Massed   &   Rojelio Brenner, PA-C   - - - - - - - - - - - - - - - - - -    Thank you for choosing [facility] at [facility] to provide your oncology and hematology care.  To afford each patient quality time with our provider, please arrive at least 15 minutes before your scheduled appointment time.   If you have a lab appointment with the Cancer Center please come in thru the Main Entrance and check in at  the main information desk.  You need to re-schedule your appointment should you arrive 10 or more minutes late.  We strive to give you quality time with our providers, and arriving late affects you and other patients whose appointments are after yours.  Also, if you no show three or more times for appointments you may be dismissed from the clinic at the providers discretion.     Again, thank you for choosing [facility].  Our hope is that these requests will decrease the amount of time that you wait before being seen by our physicians.       _____________________________________________________________  Should you have questions after your visit to [facility], please contact our office at [phone] and follow the prompts.  Our office hours are 8:00 a.m. and 4:30 p.m. Monday - Friday.  Please note that voicemails left after 4:00 p.m. may not be returned until the following business day.  We are closed weekends and major holidays.  You do have access to a nurse 24-7, just call the main number to the clinic [phone] and do not press any options, hold on the line and a nurse will answer the phone.    For prescription refill requests, have your pharmacy contact our office and allow 72 hours.

## 2022-08-02 ENCOUNTER — Encounter: Payer: Self-pay | Admitting: "Endocrinology

## 2022-08-05 ENCOUNTER — Encounter: Payer: Self-pay | Admitting: Nutrition

## 2022-08-05 ENCOUNTER — Encounter: Payer: Medicare Other | Attending: Family Medicine | Admitting: Nutrition

## 2022-08-05 VITALS — Ht 65.0 in | Wt 281.0 lb

## 2022-08-05 DIAGNOSIS — E559 Vitamin D deficiency, unspecified: Secondary | ICD-10-CM | POA: Insufficient documentation

## 2022-08-05 DIAGNOSIS — E1169 Type 2 diabetes mellitus with other specified complication: Secondary | ICD-10-CM | POA: Insufficient documentation

## 2022-08-05 DIAGNOSIS — E1159 Type 2 diabetes mellitus with other circulatory complications: Secondary | ICD-10-CM | POA: Insufficient documentation

## 2022-08-05 DIAGNOSIS — I152 Hypertension secondary to endocrine disorders: Secondary | ICD-10-CM | POA: Insufficient documentation

## 2022-08-05 DIAGNOSIS — E785 Hyperlipidemia, unspecified: Secondary | ICD-10-CM | POA: Insufficient documentation

## 2022-08-05 DIAGNOSIS — K760 Fatty (change of) liver, not elsewhere classified: Secondary | ICD-10-CM | POA: Diagnosis not present

## 2022-08-05 NOTE — Progress Notes (Signed)
Medical Nutrition Therapy  Appointment Start time:  33  Appointment End time:  1115  Primary concerns today: Obesity and DM Type 2  Referral diagnosis: E11.8, E66.01 Preferred learning style: No preference Learning readiness: Ready, Change in progress    NUTRITION ASSESSMENT  51 yr old Hispanic female with DM Type 2, HTN, Obesity, Hyperlipidemia and Fatty Liver.  She wants to lose weight and reverse her DM. Sees Dr. Fransico Him, [facility]. Since seeing him, she has gotten rid of all processed foods. Doesn't eat pork or beef anymore. Only eating fresh fruits, vegetables and whole grains.  She feels a lot better. No more bloating. Her clothes fit better. Less swelling noted. Not testing blood sugars. Only on Metformin ER 500 mg a day.  She is very motivated to continue to apply lifestyle medicine to reverse her Dm and help improve her health and reduce risk of other co morbidities.   Anthropometrics    Wt Readings from Last 3 Encounters:  08/05/22 281 lb (127.5 kg)  07/25/22 279 lb 5.2 oz (126.7 kg)  07/23/22 283 lb 3.2 oz (128.5 kg)   Ht Readings from Last 3 Encounters:  08/05/22 5\' 5"  (1.651 m)  07/25/22 5\' 5"  (1.651 m)  07/23/22 5\' 5"  (1.651 m)   Body mass index is 46.76 kg/m. @BMIFA @ Facility age limit for growth %iles is 20 years. Facility age limit for growth %iles is 20 years.   Clinical Medical Hx: Obesity, DM Medications: Metformin 500 mg ER once a day. Labs:  Lab Results  Component Value Date   HGBA1C 5.9 (H) 06/04/2022     Notable Signs/Symptoms: none  Lifestyle & Dietary Hx Lives with husband and kids. She cooks.   Estimated daily fluid intake: 100 oz Supplements: none Sleep: 8 Stress / self-care: Brother has cancer in 09/22/22. Current average weekly physical activity: ADL  24-Hr Dietary Recall First Meal: 16 oz of water,  oatmeal with apple, Snack:  Second Meal: 12-1 carrots, chicken salad with corn, cilantro, water Snack: 1/2 banana ,  water Third Meal: Tuna fish salad, water Snack:  Beverages: water  Estimated Energy Needs Calories: 1200-1500 Carbohydrate: 135g Protein: 90g Fat: 33g   NUTRITION DIAGNOSIS  NB-1.1 Food and nutrition-related knowledge deficit As related to Diabetes and Obesity.  As evidenced by A1C 5.9% and BMI of 46.   NUTRITION INTERVENTION  Nutrition education (E-1) on the following topics:  Nutrition and Diabetes education provided on My Plate, CHO counting, meal planning, portion sizes, timing of meals, avoiding snacks between meals unless having a low blood sugar, target ranges for A1C and blood sugars, signs/symptoms and treatment of hyper/hypoglycemia, monitoring blood sugars, taking medications as prescribed, benefits of exercising 30 minutes per day and prevention of complications of DM. Lifestyle Medicine  - Whole Food, Plant Predominant Nutrition is highly recommended: Eat Plenty of vegetables, Mushrooms, fruits, Legumes, Whole Grains, Nuts, seeds in lieu of processed meats, processed snacks/pastries red meat, poultry, eggs.    -It is better to avoid simple carbohydrates including: Cakes, Sweet Desserts, Ice Cream, Soda (diet and regular), Sweet Tea, Candies, Chips, Cookies, Store Bought Juices, Alcohol in Excess of  1-2 drinks a day, Lemonade,  Artificial Sweeteners, Doughnuts, Coffee Creamers, "Sugar-free" Products, etc, etc.  This is not a complete list.....  Exercise: If you are able: 30 -60 minutes a day ,4 days a week, or 150 minutes a week.  The longer the better.  Combine stretch, strength, and aerobic activities.  If you were told in the past that  you have high risk for cardiovascular diseases, you may seek evaluation by your heart doctor prior to initiating moderate to intense exercise programs.   Handouts Provided Include  Lifestyle Medicine Meal Plan Card  Learning Style & Readiness for Change Teaching method utilized: Visual & Auditory  Demonstrated degree of understanding  via: Teach Back  Barriers to learning/adherence to lifestyle change: none  Goals Established by Pt Goals  Eat three meals per day Join the [facility]. Exercise 3 times per week an hour. Lose 1 per week-weigh only 1 x per week. Take body measurements weekly   MONITORING & EVALUATION Dietary intake, weekly physical activity, and weight in 2 months.  Next Steps  Patient is to work on meal planning and meal prepping.Marland Kitchen

## 2022-08-05 NOTE — Patient Instructions (Addendum)
Goals  Eat three meals per day Join the [facility]. Exercise 3 times per week an hour. Lose 1 per week-weigh only 1 x per week. Take body measurements weekly

## 2022-08-06 ENCOUNTER — Ambulatory Visit (HOSPITAL_COMMUNITY)
Admission: RE | Admit: 2022-08-06 | Discharge: 2022-08-06 | Disposition: A | Discharge status: Home / Self Care | Payer: Medicare Other | Source: Ambulatory Visit | Attending: Family Medicine | Admitting: Family Medicine

## 2022-08-06 ENCOUNTER — Encounter (HOSPITAL_COMMUNITY): Payer: Self-pay

## 2022-08-06 DIAGNOSIS — R928 Other abnormal and inconclusive findings on diagnostic imaging of breast: Secondary | ICD-10-CM

## 2022-08-06 DIAGNOSIS — N6489 Other specified disorders of breast: Secondary | ICD-10-CM | POA: Diagnosis not present

## 2022-08-06 DIAGNOSIS — R921 Mammographic calcification found on diagnostic imaging of breast: Secondary | ICD-10-CM | POA: Diagnosis not present

## 2022-09-16 ENCOUNTER — Encounter: Payer: Medicare Other | Attending: Family Medicine | Admitting: Nutrition

## 2022-09-16 VITALS — Ht 65.0 in | Wt 279.0 lb

## 2022-09-16 DIAGNOSIS — E785 Hyperlipidemia, unspecified: Secondary | ICD-10-CM | POA: Diagnosis not present

## 2022-09-16 DIAGNOSIS — E1159 Type 2 diabetes mellitus with other circulatory complications: Secondary | ICD-10-CM | POA: Diagnosis not present

## 2022-09-16 DIAGNOSIS — K760 Fatty (change of) liver, not elsewhere classified: Secondary | ICD-10-CM | POA: Diagnosis not present

## 2022-09-16 DIAGNOSIS — E1169 Type 2 diabetes mellitus with other specified complication: Secondary | ICD-10-CM | POA: Diagnosis not present

## 2022-09-16 DIAGNOSIS — E559 Vitamin D deficiency, unspecified: Secondary | ICD-10-CM | POA: Diagnosis not present

## 2022-09-16 DIAGNOSIS — I152 Hypertension secondary to endocrine disorders: Secondary | ICD-10-CM | POA: Diagnosis not present

## 2022-09-16 NOTE — Patient Instructions (Signed)
Goals  Keep up great job! Keep eating more fresh fruits and whole grains. Keep walking Lose 1 lb per week

## 2022-09-16 NOTE — Progress Notes (Unsigned)
Medical Nutrition Theapy    1530  Appointment End time:  1600  Primary concerns today: Obesity and DM Type 2  Referral diagnosis: E11.8, E66.01 Preferred learning style: No preference Learning readiness: Ready, Change in progress    NUTRITION ASSESSMENT Follow up 51 yr old Hispanic female with DM Type 2, HTN, Obesity, Hyperlipidemia and Fatty Liver.  She wants to lose weight and reverse her DM. Sees Dr. Fransico Him, [facility].  Changes made since last visit: She is now making a grocery list and buying only foods on list. Shopping the perimeter of store and avoiding junk isles. Lost 2 lbs but she feels like she has lost inches. She is not as bloated like she was. Her clothes fit looser. She is walking more and now started going to the [facility]. She is very motivated to continue to apply lifestyle medicine to reverse her Dm and help improve her health and reduce risk of other co morbidities.   Anthropometrics    Wt Readings from Last 3 Encounters:  09/16/22 279 lb (126.6 kg)  08/05/22 281 lb (127.5 kg)  07/25/22 279 lb 5.2 oz (126.7 kg)   Ht Readings from Last 3 Encounters:  09/16/22 5\' 5"  (1.651 m)  08/05/22 5\' 5"  (1.651 m)  07/25/22 5\' 5"  (1.651 m)   Body mass index is 46.43 kg/m. @BMIFA @ Facility age limit for growth %iles is 20 years. Facility age limit for growth %iles is 20 years.   Clinical Medical Hx: Obesity, DM Medications: Metformin 500 mg ER once a day. Labs:  Lab Results  Component Value Date   HGBA1C 5.9 (H) 06/04/2022   Notable Signs/Symptoms: none  Lifestyle & Dietary Hx Lives with husband and kids. She cooks.   Estimated daily fluid intake: 100 oz Supplements: none Sleep: 8 Stress / self-care: Brother has cancer in 09/24/22. Current average weekly physical activity: ADL  24-Hr Dietary Recall First Meal: 16 oz of water,  oatmeal with apple, Snack:  Second Meal: 12-1 carrots, chicken salad with corn, cilantro, water Snack: 1/2 banana ,  water Third Meal: Tuna fish salad, water Snack:  Beverages: water  Estimated Energy Needs Calories: 1200-1500 Carbohydrate: 135g Protein: 90g Fat: 33g   NUTRITION DIAGNOSIS  NB-1.1 Food and nutrition-related knowledge deficit As related to Diabetes and Obesity.  As evidenced by A1C 5.9% and BMI of 46.   NUTRITION INTERVENTION  Nutrition education (E-1) on the following topics:  Nutrition and Diabetes education provided on My Plate, CHO counting, meal planning, portion sizes, timing of meals, avoiding snacks between meals unless having a low blood sugar, target ranges for A1C and blood sugars, signs/symptoms and treatment of hyper/hypoglycemia, monitoring blood sugars, taking medications as prescribed, benefits of exercising 30 minutes per day and prevention of complications of DM. Lifestyle Medicine  - Whole Food, Plant Predominant Nutrition is highly recommended: Eat Plenty of vegetables, Mushrooms, fruits, Legumes, Whole Grains, Nuts, seeds in lieu of processed meats, processed snacks/pastries red meat, poultry, eggs.    -It is better to avoid simple carbohydrates including: Cakes, Sweet Desserts, Ice Cream, Soda (diet and regular), Sweet Tea, Candies, Chips, Cookies, Store Bought Juices, Alcohol in Excess of  1-2 drinks a day, Lemonade,  Artificial Sweeteners, Doughnuts, Coffee Creamers, "Sugar-free" Products, etc, etc.  This is not a complete list.....  Exercise: If you are able: 30 -60 minutes a day ,4 days a week, or 150 minutes a week.  The longer the better.  Combine stretch, strength, and aerobic activities.  If you were told in  the past that you have high risk for cardiovascular diseases, you may seek evaluation by your heart doctor prior to initiating moderate to intense exercise programs.   Handouts Provided Include  Lifestyle Medicine Meal Plan Card  Learning Style & Readiness for Change Teaching method utilized: Visual & Auditory  Demonstrated degree of understanding  via: Teach Back  Barriers to learning/adherence to lifestyle change: none  Goals Established by Pt  Keep up great job! Keep eating more fresh fruits and whole grains. Keep walking Lose 1 lb per week   MONITORING & EVALUATION Dietary intake, weekly physical activity, and weight in 3 months.  Next Steps  Patient is to work on meal planning and meal prepping.Marland Kitchen

## 2022-10-02 ENCOUNTER — Ambulatory Visit: Payer: Medicare Other | Admitting: Family Medicine

## 2022-10-24 ENCOUNTER — Ambulatory Visit: Payer: Medicare Other | Admitting: "Endocrinology

## 2022-10-24 ENCOUNTER — Ambulatory Visit: Payer: Medicare Other | Admitting: Nutrition

## 2022-12-20 ENCOUNTER — Ambulatory Visit: Payer: Medicare Other | Admitting: Family Medicine

## 2022-12-22 DIAGNOSIS — E785 Hyperlipidemia, unspecified: Secondary | ICD-10-CM | POA: Diagnosis not present

## 2022-12-22 DIAGNOSIS — I10 Essential (primary) hypertension: Secondary | ICD-10-CM | POA: Diagnosis not present

## 2022-12-22 DIAGNOSIS — Z886 Allergy status to analgesic agent status: Secondary | ICD-10-CM | POA: Diagnosis not present

## 2022-12-22 DIAGNOSIS — Z91013 Allergy to seafood: Secondary | ICD-10-CM | POA: Diagnosis not present

## 2022-12-22 DIAGNOSIS — E119 Type 2 diabetes mellitus without complications: Secondary | ICD-10-CM | POA: Diagnosis not present

## 2022-12-22 DIAGNOSIS — R1032 Left lower quadrant pain: Secondary | ICD-10-CM | POA: Diagnosis not present

## 2022-12-22 DIAGNOSIS — Z7984 Long term (current) use of oral hypoglycemic drugs: Secondary | ICD-10-CM | POA: Diagnosis not present

## 2022-12-22 DIAGNOSIS — R109 Unspecified abdominal pain: Secondary | ICD-10-CM | POA: Diagnosis not present

## 2022-12-22 DIAGNOSIS — Z880 Allergy status to penicillin: Secondary | ICD-10-CM | POA: Diagnosis not present

## 2022-12-22 DIAGNOSIS — Z79899 Other long term (current) drug therapy: Secondary | ICD-10-CM | POA: Diagnosis not present

## 2022-12-23 DIAGNOSIS — R109 Unspecified abdominal pain: Secondary | ICD-10-CM | POA: Diagnosis not present

## 2022-12-27 ENCOUNTER — Ambulatory Visit (INDEPENDENT_AMBULATORY_CARE_PROVIDER_SITE_OTHER): Payer: Medicare Other | Admitting: Family Medicine

## 2022-12-27 ENCOUNTER — Encounter: Payer: Self-pay | Admitting: Family Medicine

## 2022-12-27 VITALS — BP 116/61 | HR 89 | Temp 97.4°F | Ht 65.0 in | Wt 274.6 lb

## 2022-12-27 DIAGNOSIS — E1169 Type 2 diabetes mellitus with other specified complication: Secondary | ICD-10-CM

## 2022-12-27 DIAGNOSIS — F321 Major depressive disorder, single episode, moderate: Secondary | ICD-10-CM | POA: Insufficient documentation

## 2022-12-27 DIAGNOSIS — Z1211 Encounter for screening for malignant neoplasm of colon: Secondary | ICD-10-CM

## 2022-12-27 DIAGNOSIS — Z1212 Encounter for screening for malignant neoplasm of rectum: Secondary | ICD-10-CM

## 2022-12-27 DIAGNOSIS — E785 Hyperlipidemia, unspecified: Secondary | ICD-10-CM

## 2022-12-27 DIAGNOSIS — F4329 Adjustment disorder with other symptoms: Secondary | ICD-10-CM

## 2022-12-27 DIAGNOSIS — D696 Thrombocytopenia, unspecified: Secondary | ICD-10-CM | POA: Diagnosis not present

## 2022-12-27 DIAGNOSIS — Z6841 Body Mass Index (BMI) 40.0 and over, adult: Secondary | ICD-10-CM | POA: Diagnosis not present

## 2022-12-27 DIAGNOSIS — I152 Hypertension secondary to endocrine disorders: Secondary | ICD-10-CM | POA: Diagnosis not present

## 2022-12-27 DIAGNOSIS — E1159 Type 2 diabetes mellitus with other circulatory complications: Secondary | ICD-10-CM

## 2022-12-27 MED ORDER — SERTRALINE HCL 25 MG PO TABS: 25.0000 mg | ORAL_TABLET | Freq: Every day | ORAL | 3 refills | Status: DC

## 2022-12-27 NOTE — Progress Notes (Signed)
Subjective:  Patient ID: Paula Butler, female    DOB: March 15, 1971, 52 y.o.   MRN: [id number]  Patient Care Team: Sonny Masters, FNP as PCP - General (Family Medicine) Lanelle Bal, DO as Consulting Physician (Gastroenterology)   Chief Complaint:  Medical Management of Chronic Issues (3 month follow up )   HPI: Paula Butler is a 52 y.o. female presenting on 12/27/2022 for Medical Management of Chronic Issues (3 month follow up )   1. Type 2 diabetes mellitus with other specified complication, without long-term current use of insulin (HCC) Pt was last seen in office 06/2022. She was to follow up with endo and nutrition. She states she had a family emergency and had to go to [place] for several months in 09/2022. She has been taking medications, denies polyuria, polyphagia, or polydipsia.   2. Hypertension associated with diabetes (HCC) On lisinopril and tolerating well. No cough, chest pain, leg swelling, headaches, weakness, confusion, visual changes, or syncope.   3. Hyperlipidemia associated with type 2 diabetes mellitus (HCC) On statin therapy and tolerating well. Does not follow a diet or exercise routine.   4. Morbid obesity (HCC) Does not follow a diet or exercise routine.   5. Thrombocytopenia (HCC) Has been referred to hematology. Pt denies abnormal bleeding or bruising.   6. Depression, major, single episode, moderate (HCC) Pt reports her depression and anxiety have worsened since she lost her brother in 09/2022. She was on medications in the past for anxiety and depression post the loss of her father. States she tolerated this well and is unsure of why she was taken off of medications,  denies SI or HI. Feels she needs to see someone for her ongoing depression and grief.     12/27/2022    4:11 PM 08/05/2022   10:22 AM 06/12/2022    2:28 PM 06/04/2022   10:31 AM 06/10/2018    2:42 PM  Depression screen PHQ 2/9  Decreased Interest 0 0 1 0 0  Down,  Depressed, Hopeless 1 0 1 0 0  PHQ - 2 Score 1 0 2 0 0  Altered sleeping 2  0    Tired, decreased energy 2  1    Change in appetite 1  0    Feeling bad or failure about yourself  0  1    Trouble concentrating 0  0    Moving slowly or fidgety/restless 0  0    Suicidal thoughts 0  0    PHQ-9 Score 6  4    Difficult doing work/chores Not difficult at all  Not difficult at all        12/27/2022    4:12 PM  GAD 7 : Generalized Anxiety Score  Nervous, Anxious, on Edge 0  Control/stop worrying 0  Worry too much - different things 0  Trouble relaxing 1  Restless 1  Easily annoyed or irritable 1  Afraid - awful might happen 0  Total GAD 7 Score 3  Anxiety Difficulty Not difficult at all     Relevant past medical, surgical, family, and social history reviewed and updated as indicated.  Allergies and medications reviewed and updated. Data reviewed: Chart in Epic.   Past Medical History:  Diagnosis Date   Diabetes mellitus without complication (HCC)    Hyperlipidemia    Hypertension     Past Surgical History:  Procedure Laterality Date   CHOLECYSTECTOMY     ESOPHAGOGASTRODUODENOSCOPY  06/14/2009   Dr. Otho Najjar;  mild esophagitis biopsied, small gastric ulcer-healing s/p biopsied, gastritis biopsied, mild duodenitis biopsied.  Esophageal biopsy unremarkable, gastric and duodenal biopsy with no significant histopathologic features.   EXCISION OF BREAST BIOPSY Left 05/29/2018   Procedure: NIPPLE EXPLORATION/EXCISION RETROAREOLA TISSUE LEFT BREAST;  Surgeon: Lucretia Roers, MD;  Location: [facility];  Service: General;  Laterality: Left;   TUBAL LIGATION      Social History   Socioeconomic History   Marital status: Married    Spouse name: Paula Butler   Number of children: 3   Years of education: Not on file   Highest education level: Not on file  Occupational History   Occupation: disability  Tobacco Use   Smoking status: Never   Smokeless tobacco: Never  Vaping Use   Vaping Use:  Never used  Substance and Sexual Activity   Alcohol use: No   Drug use: No   Sexual activity: Not Currently    Partners: Male    Birth control/protection: Surgical    Comment: tubal  Other Topics Concern   Not on file  Social History Narrative   Married for 10 years, 3 children, all grown/teenagers.    2 daughters, 1 son - children live with her   Attends church.    Walks for exercise.   Enjoys singing.   Eats all food groups.   Wears seatbelt.   Born in [place], grew up in [place]. Father in [employer]. Grandmother raised her.    Moved from [place] in 2018.    Does not work out side of home.    Social Determinants of Health   Financial Resource Strain: Low Risk  (06/12/2022)   Overall Financial Resource Strain (CARDIA)    Difficulty of Paying Living Expenses: Not hard at all  Food Insecurity: No Food Insecurity (06/12/2022)   Hunger Vital Sign    Worried About Running Out of Food in the Last Year: Never true    Ran Out of Food in the Last Year: Never true  Transportation Needs: No Transportation Needs (06/12/2022)   PRAPARE - Administrator, Civil Service (Medical): No    Lack of Transportation (Non-Medical): No  Physical Activity: Inactive (06/12/2022)   Exercise Vital Sign    Days of Exercise per Week: 0 days    Minutes of Exercise per Session: 0 min  Stress: No Stress Concern Present (06/12/2022)   [employer] of Occupational Health - Occupational Stress Questionnaire    Feeling of Stress : Not at all  Social Connections: Moderately Isolated (06/12/2022)   Social Connection and Isolation Panel [NHANES]    Frequency of Communication with Friends and Family: More than three times a week    Frequency of Social Gatherings with Friends and Family: More than three times a week    Attends Religious Services: Never    Database administrator or Organizations: No    Attends Banker Meetings: Never    Marital Status: Married  Catering manager Violence:  Not At Risk (06/12/2022)   Humiliation, Afraid, Rape, and Kick questionnaire    Fear of Current or Ex-Partner: No    Emotionally Abused: No    Physically Abused: No    Sexually Abused: No    Outpatient Encounter Medications as of 12/27/2022  Medication Sig   atorvastatin (LIPITOR) 10 MG tablet Take 1 tablet (10 mg total) by mouth daily.   cyclobenzaprine (FLEXERIL) 10 MG tablet Take 10 mg by mouth 3 (three) times daily as needed for muscle spasms.  lisinopril (ZESTRIL) 2.5 MG tablet Take 1 tablet (2.5 mg total) by mouth daily.   metFORMIN (GLUCOPHAGE XR) 500 MG 24 hr tablet Take 1 tablet (500 mg total) by mouth daily with breakfast.   methylPREDNISolone (MEDROL DOSEPAK) 4 MG TBPK tablet Take by mouth.   sertraline (ZOLOFT) 25 MG tablet Take 1 tablet (25 mg total) by mouth daily.   Cholecalciferol (VITAMIN D3) 125 MCG (5000 UT) CAPS Take 1 capsule (5,000 Units total) by mouth daily. (Patient not taking: Reported on 12/27/2022)   No facility-administered encounter medications on file as of 12/27/2022.    Allergies  Allergen Reactions   Fish Allergy Anaphylaxis, Shortness Of Breath and Rash   Shrimp [Shellfish Allergy] Anaphylaxis, Shortness Of Breath and Rash   Asa [Aspirin] Other (See Comments)    Abdominal swelling    Penicillins Hives, Itching and Rash    Has patient had a PCN reaction causing immediate rash, facial/tongue/throat swelling, SOB or lightheadedness with hypotension: Yes Has patient had a PCN reaction causing severe rash involving mucus membranes or skin necrosis: No Has patient had a PCN reaction that required hospitalization: Yes Has patient had a PCN reaction occurring within the last 10 years: Yes If all of the above answers are "NO", then may proceed with Cephalosporin use.     Review of Systems  Constitutional:  Positive for activity change, appetite change and fatigue. Negative for chills, diaphoresis, fever and unexpected weight change.  HENT: Negative.     Eyes: Negative.  Negative for photophobia and visual disturbance.  Respiratory:  Negative for cough, chest tightness and shortness of breath.   Cardiovascular:  Negative for chest pain, palpitations and leg swelling.  Gastrointestinal:  Negative for blood in stool, constipation, diarrhea, nausea and vomiting.  Endocrine: Negative.  Negative for polydipsia, polyphagia and polyuria.  Genitourinary:  Negative for decreased urine volume, difficulty urinating, dysuria, frequency and urgency.  Musculoskeletal:  Negative for arthralgias and myalgias.  Skin: Negative.   Allergic/Immunologic: Negative.   Neurological:  Negative for dizziness and headaches.  Hematological: Negative.   Psychiatric/Behavioral:  Positive for agitation and sleep disturbance. Negative for behavioral problems, confusion, decreased concentration, dysphoric mood, hallucinations, self-injury and suicidal ideas. The patient is nervous/anxious. The patient is not hyperactive.   All other systems reviewed and are negative.       Objective:  BP 116/61   Pulse 89   Temp (!) 97.4 F (36.3 C) (Temporal)   Ht 5\' 5"  (1.651 m)   Wt 274 lb 9.6 oz (124.6 kg)   SpO2 94%   BMI 45.70 kg/m    Wt Readings from Last 3 Encounters:  12/27/22 274 lb 9.6 oz (124.6 kg)  09/16/22 279 lb (126.6 kg)  08/05/22 281 lb (127.5 kg)    Physical Exam Vitals and nursing note reviewed.  Constitutional:      General: She is not in acute distress.    Appearance: Normal appearance. She is well-developed and well-groomed. She is morbidly obese. She is not ill-appearing, toxic-appearing or diaphoretic.  HENT:     Head: Normocephalic and atraumatic.     Jaw: There is normal jaw occlusion.     Right Ear: Hearing normal.     Left Ear: Hearing normal.     Nose: Nose normal.     Mouth/Throat:     Lips: Pink.     Mouth: Mucous membranes are moist.     Pharynx: Oropharynx is clear. Uvula midline.  Eyes:     General: Lids are normal.  Extraocular Movements: Extraocular movements intact.     Conjunctiva/sclera: Conjunctivae normal.     Pupils: Pupils are equal, round, and reactive to light.  Neck:     Thyroid: No thyroid mass, thyromegaly or thyroid tenderness.     Vascular: No carotid bruit or JVD.     Trachea: Trachea and phonation normal.  Cardiovascular:     Rate and Rhythm: Normal rate and regular rhythm.     Chest Wall: PMI is not displaced.     Pulses: Normal pulses.     Heart sounds: Normal heart sounds. No murmur heard.    No friction rub. No gallop.  Pulmonary:     Effort: Pulmonary effort is normal. No respiratory distress.     Breath sounds: Normal breath sounds. No wheezing.  Abdominal:     General: Bowel sounds are normal. There is no distension or abdominal bruit.     Palpations: Abdomen is soft. There is no hepatomegaly or splenomegaly.     Tenderness: There is no abdominal tenderness. There is no right CVA tenderness or left CVA tenderness.     Hernia: No hernia is present.  Musculoskeletal:        General: Normal range of motion.     Cervical back: Normal range of motion and neck supple.     Right lower leg: No edema.     Left lower leg: No edema.  Lymphadenopathy:     Cervical: No cervical adenopathy.  Skin:    General: Skin is warm and dry.     Capillary Refill: Capillary refill takes less than 2 seconds.     Coloration: Skin is not cyanotic, jaundiced or pale.     Findings: No rash.  Neurological:     General: No focal deficit present.     Mental Status: She is alert and oriented to person, place, and time.     Sensory: Sensation is intact.     Motor: Motor function is intact.     Coordination: Coordination is intact.     Gait: Gait is intact.     Deep Tendon Reflexes: Reflexes are normal and symmetric.  Psychiatric:        Attention and Perception: Attention and perception normal.        Mood and Affect: Mood normal. Affect is tearful.        Speech: Speech normal.        Behavior:  Behavior normal. Behavior is cooperative.        Thought Content: Thought content normal.        Cognition and Memory: Cognition and memory normal.        Judgment: Judgment normal.     Results for orders placed or performed in visit on 07/09/22  Copper, serum  Result Value Ref Range   Copper 132 80 - 158 ug/dL  Reticulocytes  Result Value Ref Range   Retic Ct Pct 2.2 0.4 - 3.1 %   RBC. 4.41 3.87 - 5.11 MIL/uL   Retic Count, Absolute 95.7 19.0 - 186.0 K/uL   Immature Retic Fract 23.9 (H) 2.3 - 15.9 %  Methylmalonic acid, serum  Result Value Ref Range   Methylmalonic Acid, Quantitative 114 0 - 378 nmol/L  Folate  Result Value Ref Range   Folate 9.2 >5.9 ng/mL  Vitamin B12  Result Value Ref Range   Vitamin B-12 446 180 - 914 pg/mL  Von Willebrand panel  Result Value Ref Range   Coagulation Factor VIII 123 56 - 140 %  Ristocetin Co-factor, Plasma 94 50 - 200 %   Von Willebrand Antigen, Plasma 142 50 - 200 %  Rheumatoid factor  Result Value Ref Range   Rhuematoid fact SerPl-aCnc 16.2 (H) <14.0 IU/mL  ANA, IFA (with reflex)  Result Value Ref Range   ANA Ab, IFA Negative   Hepatitis B surface antigen  Result Value Ref Range   Hepatitis B Surface Ag NON REACTIVE NON REACTIVE  Hepatitis B surface antibody  Result Value Ref Range   Hep B S Ab NON REACTIVE NON REACTIVE  Hepatitis B core antibody, total  Result Value Ref Range   Hep B Core Total Ab NON REACTIVE NON REACTIVE  Hepatitis C Antibody  Result Value Ref Range   HCV Ab NON REACTIVE NON REACTIVE  H Pylori, IGM, IGG, IGA AB  Result Value Ref Range   H. Pylogi, Iga Abs 9.0 (H) 0.0 - 8.9 units   H. Pylogi, Igm Abs <9.0 0.0 - 8.9 units   H Pylori IgG 1.21 (H) 0.00 - 0.79 Index Value  Lactate dehydrogenase  Result Value Ref Range   LDH 170 98 - 192 U/L  CBC with Differential  Result Value Ref Range   WBC 6.7 4.0 - 10.5 K/uL   RBC 4.51 3.87 - 5.11 MIL/uL   Hemoglobin 13.7 12.0 - 15.0 g/dL   HCT 51.7 61.6 - 07.3 %    MCV 93.8 80.0 - 100.0 fL   MCH 30.4 26.0 - 34.0 pg   MCHC 32.4 30.0 - 36.0 g/dL   RDW 71.0 62.6 - 94.8 %   Platelets 127 (L) 150 - 400 K/uL   nRBC 0.0 0.0 - 0.2 %   Neutrophils Relative % 56 %   Neutro Abs 3.7 1.7 - 7.7 K/uL   Lymphocytes Relative 32 %   Lymphs Abs 2.1 0.7 - 4.0 K/uL   Monocytes Relative 8 %   Monocytes Absolute 0.6 0.1 - 1.0 K/uL   Eosinophils Relative 3 %   Eosinophils Absolute 0.2 0.0 - 0.5 K/uL   Basophils Relative 1 %   Basophils Absolute 0.0 0.0 - 0.1 K/uL   Immature Granulocytes 0 %   Abs Immature Granulocytes 0.02 0.00 - 0.07 K/uL  Coag Studies Interp Report  Result Value Ref Range   Interpretation Note        Pertinent labs & imaging results that were available during my care of the patient were reviewed by me and considered in my medical decision making.  Assessment & Plan:  Corrin was seen today for medical management of chronic issues.  Diagnoses and all orders for this visit:  Type 2 diabetes mellitus with other specified complication, without long-term current use of insulin (HCC) Pt was supposed to see endocrinology but had a family emergency and was unable to keep appointment. Aware to make appointment to see cardiology. Labs pending. Diet and exercise encouraged. If A1C is not at goal with monotherapy, consider GLP-1 therapy.  -     CMP14+EGFR -     CBC with Differential/Platelet -     Lipid panel -     Thyroid Panel With TSH -     Bayer DCA Hb A1c Waived  Hypertension associated with diabetes (HCC) BP well controlled. Changes were not made in regimen today. Goal BP is 130/80. Pt aware to report any persistent high or low readings. DASH diet and exercise encouraged. Exercise at least 150 minutes per week and increase as tolerated. Goal BMI > 25. Stress management encouraged. Avoid nicotine  and tobacco product use. Avoid excessive alcohol and NSAID's. Avoid more than 2000 mg of sodium daily. Medications as prescribed. Follow up as scheduled.   -     CMP14+EGFR -     CBC with Differential/Platelet -     Thyroid Panel With TSH  Hyperlipidemia associated with type 2 diabetes mellitus ([place]) Diet encouraged - increase intake of fresh fruits and vegetables, increase intake of lean proteins. Bake, broil, or grill foods. Avoid fried, greasy, and fatty foods. Avoid fast foods. Increase intake of fiber-rich whole grains. Exercise encouraged - at least 150 minutes per week and advance as tolerated.  Goal BMI < 25. Continue medications as prescribed. Follow up in 3-6 months as discussed.  -     CMP14+EGFR -     Lipid panel  Morbid obesity ([address]) Diet and exercise encouraged. Labs pending.  -     CMP14+EGFR -     CBC with Differential/Platelet -     Lipid panel -     Thyroid Panel With TSH -     Bayer DCA Hb A1c Waived  Thrombocytopenia (HCC) Labs pending, denies abnormal bleeding or bruising.  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 Tearful in office. Agrees to seeing counselor. Will initiate below as pt has required SSRI/SNRI therapy in the past for depression. Denies SI or HI.  -     Ambulatory referral to Psychology -     sertraline (ZOLOFT) 25 MG tablet; Take 1 tablet (25 mg total) by mouth daily.  Screening for colorectal cancer -     Ambulatory referral to Gastroenterology     Continue all other maintenance medications.  Follow up plan: Return in about 4 weeks (around 01/24/2023), or if symptoms worsen or fail to improve, for depression .   Continue healthy lifestyle choices, including diet (rich in fruits, vegetables, and lean proteins, and low in salt and simple carbohydrates) and exercise (at least 30 minutes of moderate physical activity daily).  Educational handout given for managing loss  The above assessment and management plan was discussed  with the patient. The patient verbalized understanding of and has agreed to the management plan. Patient is aware to call the clinic if they develop any new symptoms or if symptoms persist or worsen. Patient is aware when to return to the clinic for a follow-up visit. Patient educated on when it is appropriate to go to the emergency department.   Monia Pouch, FNP-C [facility] [phone]

## 2022-12-28 LAB — CMP14+EGFR
ALT: 40 IU/L — ABNORMAL HIGH (ref 0–32)
AST: 30 IU/L (ref 0–40)
Albumin/Globulin Ratio: 1.3 (ref 1.2–2.2)
Albumin: 4.5 g/dL (ref 3.9–4.9)
Alkaline Phosphatase: 105 IU/L (ref 44–121)
BUN/Creatinine Ratio: 16 (ref 9–23)
BUN: 17 mg/dL (ref 6–24)
Bilirubin Total: 0.7 mg/dL (ref 0.0–1.2)
CO2: 24 mmol/L (ref 20–29)
Calcium: 10 mg/dL (ref 8.7–10.2)
Chloride: 98 mmol/L (ref 96–106)
Creatinine, Ser: 1.07 mg/dL — ABNORMAL HIGH (ref 0.57–1.00)
Globulin, Total: 3.5 g/dL (ref 1.5–4.5)
Glucose: 107 mg/dL — ABNORMAL HIGH (ref 70–99)
Potassium: 4.4 mmol/L (ref 3.5–5.2)
Sodium: 137 mmol/L (ref 134–144)
Total Protein: 8 g/dL (ref 6.0–8.5)
eGFR: 64 mL/min/{1.73_m2} (ref >59)

## 2022-12-28 LAB — CBC WITH DIFFERENTIAL/PLATELET
Basophils Absolute: 0 10*3/uL (ref 0.0–0.2)
Basos: 0 %
EOS (ABSOLUTE): 0.3 10*3/uL (ref 0.0–0.4)
Eos: 3 %
Hematocrit: 49.1 % — ABNORMAL HIGH (ref 34.0–46.6)
Hemoglobin: 16.7 g/dL — ABNORMAL HIGH (ref 11.1–15.9)
Immature Grans (Abs): 0.1 10*3/uL (ref 0.0–0.1)
Immature Granulocytes: 1 %
Lymphocytes Absolute: 2.5 10*3/uL (ref 0.7–3.1)
Lymphs: 28 %
MCH: 30.5 pg (ref 26.6–33.0)
MCHC: 34 g/dL (ref 31.5–35.7)
MCV: 90 fL (ref 79–97)
Monocytes Absolute: 0.7 10*3/uL (ref 0.1–0.9)
Monocytes: 8 %
Neutrophils Absolute: 5.4 10*3/uL (ref 1.4–7.0)
Neutrophils: 60 %
Platelets: 157 10*3/uL (ref 150–450)
RBC: 5.48 x10E6/uL — ABNORMAL HIGH (ref 3.77–5.28)
RDW: 13.7 % (ref 11.7–15.4)
WBC: 8.9 10*3/uL (ref 3.4–10.8)

## 2022-12-28 LAB — THYROID PANEL WITH TSH
Free Thyroxine Index: 1.8 (ref 1.2–4.9)
T3 Uptake Ratio: 23 % — ABNORMAL LOW (ref 24–39)
T4, Total: 7.9 ug/dL (ref 4.5–12.0)
TSH: 4.31 u[IU]/mL (ref 0.450–4.500)

## 2022-12-28 LAB — LIPID PANEL
Chol/HDL Ratio: 6 ratio — ABNORMAL HIGH (ref 0.0–4.4)
Cholesterol, Total: 296 mg/dL — ABNORMAL HIGH (ref 100–199)
HDL: 49 mg/dL (ref >39)
LDL Chol Calc (NIH): 198 mg/dL — ABNORMAL HIGH (ref 0–99)
Triglycerides: 253 mg/dL — ABNORMAL HIGH (ref 0–149)
VLDL Cholesterol Cal: 49 mg/dL — ABNORMAL HIGH (ref 5–40)

## 2022-12-30 LAB — BAYER DCA HB A1C WAIVED: HB A1C (BAYER DCA - WAIVED): 6.3 % — ABNORMAL HIGH (ref 4.8–5.6)

## 2022-12-30 MED ORDER — ATORVASTATIN CALCIUM 20 MG PO TABS: 20.0000 mg | ORAL_TABLET | Freq: Every day | ORAL | 3 refills | Status: DC

## 2022-12-30 NOTE — Addendum Note (Signed)
Addended by: Baruch Gouty on: 12/30/2022 11:58 AM   Modules accepted: Orders

## 2022-12-31 ENCOUNTER — Telehealth: Payer: Self-pay

## 2022-12-31 ENCOUNTER — Other Ambulatory Visit: Payer: Self-pay | Admitting: Family Medicine

## 2022-12-31 ENCOUNTER — Encounter: Payer: Self-pay | Admitting: Family Medicine

## 2022-12-31 ENCOUNTER — Encounter: Payer: Self-pay | Admitting: *Deleted

## 2022-12-31 DIAGNOSIS — E1169 Type 2 diabetes mellitus with other specified complication: Secondary | ICD-10-CM

## 2022-12-31 MED ORDER — OZEMPIC (0.25 OR 0.5 MG/DOSE) 2 MG/3ML ~~LOC~~ SOPN: 0.2500 mg | PEN_INJECTOR | SUBCUTANEOUS | 2 refills | Status: DC

## 2022-12-31 NOTE — Telephone Encounter (Signed)
Paula Butler ([name]) Rx #: [id number] Ozempic (0.25 or 0.5 MG/DOSE) 2MG /3ML pen-injectors Form [employer] Part D Electronic Prior Authorization Form (2017 NCPDP) Created 16 minutes ago Sent to Plan 3 minutes ago Plan Response 3 minutes ago Submit Clinical Questions less than a minute ago Determination Wait for Determination Please wait for [employer] 2017 NCPDP to return a determination.

## 2023-01-01 NOTE — Telephone Encounter (Signed)
Medication was denied by insurance after prior authorization submitted  Paula Butler (Key: [id number]) Rx #: [id number] Ozempic (0.25 or 0.5 MG/DOSE) 2MG /3ML pen-injectors Form [employer] Medicare Part D Electronic Prior Authorization Form (2017 NCPDP) Created 1 day ago Sent to Plan 1 day ago Plan Response 1 day ago Submit Clinical Questions 1 day ago Determination Unfavorable 23 hours ago Message from Plan Request Reference Number: [id number]. OZEMPIC INJ 2MG /3ML is denied for not meeting the prior authorization requirement(s). Details of this decision are in the notice attached below or have been faxed to you.

## 2023-01-02 MED ORDER — TRULICITY 1.5 MG/0.5ML ~~LOC~~ SOAJ: 1.5000 mg | SUBCUTANEOUS | 3 refills | Status: DC

## 2023-01-02 MED ORDER — TRULICITY 0.75 MG/0.5ML ~~LOC~~ SOAJ: 0.7500 mg | SUBCUTANEOUS | 0 refills | Status: AC

## 2023-01-02 NOTE — Telephone Encounter (Signed)
lmtcb

## 2023-01-02 NOTE — Addendum Note (Signed)
Addended by: Baruch Gouty on: 01/02/2023 10:01 AM   Modules accepted: Orders

## 2023-01-13 NOTE — Telephone Encounter (Signed)
Lmtcb   Detailed letter sent to patient since no call back

## 2023-01-23 NOTE — Progress Notes (Signed)
[facility] [place], [place] [address]   CLINIC:  Medical Oncology/Hematology  PCP:  Baruch Gouty, [place] [address] [phone]   REASON FOR VISIT:  Follow-up for mild thrombocytopenia   PRIOR THERAPY: None   CURRENT THERAPY: Surveillance  INTERVAL HISTORY:   [name] 52 y.o. female returns for routine follow-up of mild thrombocytopenia.  She was last seen by Tarri Abernethy PA-C on 07/25/2022.  At today's visit, she reports feeling fairly well regarding her health, but she is dealing with grief after losing her brother to cancer in 2023.   She reports easy bruising but denies any petechial rash or spontaneous purpura.  No epistaxis, gum bleeding, hematemesis, hematochezia, melena, or hematuria.  She reports hot flashes night sweats secondary to menopause.  She denies any unexplained fevers, chills, or weight loss.  She has 100% energy and 100% appetite. She is trying to lose weight with diet, exercise, and Ozempic.  ASSESSMENT & PLAN:  1.  Mild thrombocytopenia - Patient evaluated for mild thrombocytopenia since 2019. - She reports nosebleeds 1-2 times per week only in the hot weather since 2017. - She also reports bruising on extremities. - Denies any bleeding per rectum or melena.  She does not use any quinine supplements.  She attained menopause 2 years ago.  Prior to that she used to have heavy menses all her life. - Ultrasound of the abdomen in 2019 shows fatty liver.  Splenic ultrasound (07/17/2022) shows spleen measuring 8.4 x 6.1 x 12.6 cm with splenic volume 335 cc. - Hematology workup (07/09/2022): Hepatitis B and hepatitis C negative.  H. pylori IgA and IgG antibodies positive. Normal B12, folate, methylmalonic acid, copper Normal reticulocytes, LDH.  Negative ANA.  Rheumatoid factor weakly positive at 16.2. Von Willebrand panel negative. - CBC today (01/24/2023): Platelets 124.  CMP and LDH unremarkable. -  Differential diagnosis favors mild thrombocytopenia in the setting of fatty liver disease.  Differential also includes immune mediated thrombocytopenia. - PLAN: No indication for treatment of thrombocytopenia at this time.    2.  Social/family history: - She is on disability.  Used to work at a [employer].  Non-smoker. - Sister had acute leukemia.  Another sister died of breast cancer.  Brother had pancreatic cancer at age 68.  Mother has rheumatoid arthritis.  Half brother had cancer in the liver.  Father died of stomach cancer.    PLAN SUMMARY: >> Labs only (CBC) in 6 months >> Same-day labs (CBC/D, CMP, LDH) + OFFICE visit in 1 year     REVIEW OF SYSTEMS:   Review of Systems  Constitutional:  Negative for appetite change, chills, diaphoresis, fatigue, fever and unexpected weight change.  HENT:   Negative for lump/mass and nosebleeds.   Eyes:  Negative for eye problems.  Respiratory:  Negative for cough, hemoptysis and shortness of breath.   Cardiovascular:  Negative for chest pain, leg swelling and palpitations.  Gastrointestinal:  Positive for nausea. Negative for abdominal pain, blood in stool, constipation, diarrhea and vomiting.  Genitourinary:  Negative for hematuria.   Skin: Negative.   Neurological:  Positive for numbness. Negative for dizziness, headaches and light-headedness.  Hematological:  Does not bruise/bleed easily.  Psychiatric/Behavioral:  Positive for depression. The patient is nervous/anxious.      PHYSICAL EXAM:  ECOG PERFORMANCE STATUS: 0 - Asymptomatic  There were no vitals filed for this visit. There were no vitals filed for this visit. Physical Exam Constitutional:  Appearance: Normal appearance. She is morbidly obese.  HENT:     Head: Normocephalic and atraumatic.     Mouth/Throat:     Mouth: Mucous membranes are moist.  Eyes:     Extraocular Movements: Extraocular movements intact.     Pupils: Pupils are equal, round, and reactive to light.   Cardiovascular:     Rate and Rhythm: Normal rate and regular rhythm.     Pulses: Normal pulses.     Heart sounds: Normal heart sounds.  Pulmonary:     Effort: Pulmonary effort is normal.     Breath sounds: Normal breath sounds.  Abdominal:     General: Bowel sounds are normal.     Palpations: Abdomen is soft.     Tenderness: There is no abdominal tenderness.  Musculoskeletal:        General: No swelling.     Right lower leg: No edema.     Left lower leg: No edema.  Lymphadenopathy:     Cervical: No cervical adenopathy.  Skin:    General: Skin is warm and dry.  Neurological:     General: No focal deficit present.     Mental Status: She is alert and oriented to person, place, and time.  Psychiatric:        Mood and Affect: Mood normal.        Behavior: Behavior normal.     PAST MEDICAL/SURGICAL HISTORY:  Past Medical History:  Diagnosis Date   Diabetes mellitus without complication ([place])    Hyperlipidemia    Hypertension    Past Surgical History:  Procedure Laterality Date   CHOLECYSTECTOMY     ESOPHAGOGASTRODUODENOSCOPY  06/14/2009   Dr. Zeb Comfort; mild esophagitis biopsied, small gastric ulcer-healing s/p biopsied, gastritis biopsied, mild duodenitis biopsied.  Esophageal biopsy unremarkable, gastric and duodenal biopsy with no significant histopathologic features.   EXCISION OF BREAST BIOPSY Left 05/29/2018   Procedure: NIPPLE EXPLORATION/EXCISION RETROAREOLA TISSUE LEFT BREAST;  Surgeon: Paula Cagey, MD;  Location: [facility];  Service: General;  Laterality: Left;   TUBAL LIGATION      SOCIAL HISTORY:  Social History   Socioeconomic History   Marital status: Married    Spouse name: Jose   Number of children: 3   Years of education: Not on file   Highest education level: Not on file  Occupational History   Occupation: disability  Tobacco Use   Smoking status: Never   Smokeless tobacco: Never  Vaping Use   Vaping Use: Never used  Substance and Sexual  Activity   Alcohol use: No   Drug use: No   Sexual activity: Not Currently    Partners: Male    Birth control/protection: Surgical    Comment: tubal  Other Topics Concern   Not on file  Social History Narrative   Married for 10 years, 3 children, all grown/teenagers.    2 daughters, 1 son - children live with her   Attends church.    Walks for exercise.   Enjoys singing.   Eats all food groups.   Wears seatbelt.   Born in [place], grew up in [place]. Father in [employer]. Grandmother raised her.    Moved from [place] in 2018.    Does not work out side of home.    Social Determinants of Health   Financial Resource Strain: Low Risk  (06/12/2022)   Overall Financial Resource Strain (CARDIA)    Difficulty of Paying Living Expenses: Not hard at all  Food Insecurity: No Food Insecurity (  06/12/2022)   Hunger Vital Sign    Worried About Running Out of Food in the Last Year: Never true    [place] in the Last Year: Never true  Transportation Needs: No Transportation Needs (06/12/2022)   PRAPARE - Hydrologist (Medical): No    Lack of Transportation (Non-Medical): No  Physical Activity: Inactive (06/12/2022)   Exercise Vital Sign    Days of Exercise per Week: 0 days    Minutes of Exercise per Session: 0 min  Stress: No Stress Concern Present (06/12/2022)   [place]    Feeling of Stress : Not at all  Social Connections: Moderately Isolated (06/12/2022)   Social Connection and Isolation Panel [NHANES]    Frequency of Communication with Friends and Family: More than three times a week    Frequency of Social Gatherings with Friends and Family: More than three times a week    Attends Religious Services: Never    Marine scientist or Organizations: No    Attends Archivist Meetings: Never    Marital Status: Married  Human resources officer Violence: Not At Risk (06/12/2022)    Humiliation, Afraid, Rape, and Kick questionnaire    Fear of Current or Ex-Partner: No    Emotionally Abused: No    Physically Abused: No    Sexually Abused: No    FAMILY HISTORY:  Family History  Problem Relation Age of Onset   Diabetes Mother    Hypothyroidism Mother    Hyperlipidemia Mother    Hyperlipidemia Father    Hypertension Father    Diabetes Father    Prostate cancer Father    Breast cancer Sister    Stroke Brother    Pancreatic cancer Brother     CURRENT MEDICATIONS:  Outpatient Encounter Medications as of 01/24/2023  Medication Sig   Dulaglutide (TRULICITY) A999333 0000000 SOPN Inject 0.75 mg into the skin once a week for 4 doses.   [START ON 01/30/2023] Dulaglutide (TRULICITY) 1.5 0000000 SOPN Inject 1.5 mg into the skin once a week for 16 doses.   atorvastatin (LIPITOR) 20 MG tablet Take 1 tablet (20 mg total) by mouth daily.   cyclobenzaprine (FLEXERIL) 10 MG tablet Take 10 mg by mouth 3 (three) times daily as needed for muscle spasms.   lisinopril (ZESTRIL) 2.5 MG tablet Take 1 tablet (2.5 mg total) by mouth daily.   metFORMIN (GLUCOPHAGE XR) 500 MG 24 hr tablet Take 1 tablet (500 mg total) by mouth daily with breakfast.   methylPREDNISolone (MEDROL DOSEPAK) 4 MG TBPK tablet Take by mouth.   sertraline (ZOLOFT) 25 MG tablet Take 1 tablet (25 mg total) by mouth daily.   No facility-administered encounter medications on file as of 01/24/2023.    ALLERGIES:  Allergies  Allergen Reactions   Fish Allergy Anaphylaxis, Shortness Of Breath and Rash   Shrimp [Shellfish Allergy] Anaphylaxis, Shortness Of Breath and Rash   Asa [Aspirin] Other (See Comments)    Abdominal swelling    Penicillins Hives, Itching and Rash    Has patient had a PCN reaction causing immediate rash, facial/tongue/throat swelling, SOB or lightheadedness with hypotension: Yes Has patient had a PCN reaction causing severe rash involving mucus membranes or skin necrosis: No Has patient had a PCN  reaction that required hospitalization: Yes Has patient had a PCN reaction occurring within the last 10 years: Yes If all of the above answers are "NO", then may proceed  with Cephalosporin use.     LABORATORY DATA:  I have reviewed the labs as listed.  CBC    Component Value Date/Time   WBC 8.9 12/27/2022 1630   WBC 6.7 07/09/2022 1028   RBC 5.48 (H) 12/27/2022 1630   RBC 4.41 07/09/2022 1029   RBC 4.51 07/09/2022 1028   HGB 16.7 (H) 12/27/2022 1630   HCT 49.1 (H) 12/27/2022 1630   PLT 157 12/27/2022 1630   MCV 90 12/27/2022 1630   MCH 30.5 12/27/2022 1630   MCH 30.4 07/09/2022 1028   MCHC 34.0 12/27/2022 1630   MCHC 32.4 07/09/2022 1028   RDW 13.7 12/27/2022 1630   LYMPHSABS 2.5 12/27/2022 1630   MONOABS 0.6 07/09/2022 1028   EOSABS 0.3 12/27/2022 1630   BASOSABS 0.0 12/27/2022 1630      Latest Ref Rng & Units 12/27/2022    4:30 PM 06/04/2022   10:34 AM 01/19/2019   12:00 AM  CMP  Glucose 70 - 99 mg/dL 107  117    BUN 6 - 24 mg/dL 17  13  16      $ Creatinine 0.57 - 1.00 mg/dL 1.07  0.80  0.9      Sodium 134 - 144 mmol/L 137  140    Potassium 3.5 - 5.2 mmol/L 4.4  4.2    Chloride 96 - 106 mmol/L 98  103    CO2 20 - 29 mmol/L 24  21    Calcium 8.7 - 10.2 mg/dL 10.0  9.5    Total Protein 6.0 - 8.5 g/dL 8.0  7.0    Total Bilirubin 0.0 - 1.2 mg/dL 0.7  0.4    Alkaline Phos 44 - 121 IU/L 105  93    AST 0 - 40 IU/L 30  30    ALT 0 - 32 IU/L 40  31       This result is from an external source.    DIAGNOSTIC IMAGING:  I have independently reviewed the relevant imaging and discussed with the patient.   WRAP UP:  All questions were answered. The patient knows to call the clinic with any problems, questions or concerns.  Medical decision making: Low  Time spent on visit: I spent 15 minutes counseling the patient face to face. The total time spent in the appointment was 22 minutes and more than 50% was on counseling.  Harriett Rush, PA-C  01/24/23 9:39 AM

## 2023-01-24 ENCOUNTER — Ambulatory Visit: Payer: Medicare Other | Admitting: Family Medicine

## 2023-01-24 ENCOUNTER — Other Ambulatory Visit: Payer: Self-pay | Admitting: Family Medicine

## 2023-01-24 ENCOUNTER — Inpatient Hospital Stay: Payer: Medicare Other | Attending: Physician Assistant | Admitting: Physician Assistant

## 2023-01-24 ENCOUNTER — Encounter: Payer: Self-pay | Admitting: Family Medicine

## 2023-01-24 ENCOUNTER — Inpatient Hospital Stay: Payer: Medicare Other

## 2023-01-24 VITALS — BP 137/88 | HR 74 | Temp 98.0°F | Resp 16 | Wt 272.0 lb

## 2023-01-24 DIAGNOSIS — Z8042 Family history of malignant neoplasm of prostate: Secondary | ICD-10-CM | POA: Diagnosis not present

## 2023-01-24 DIAGNOSIS — K760 Fatty (change of) liver, not elsewhere classified: Secondary | ICD-10-CM | POA: Insufficient documentation

## 2023-01-24 DIAGNOSIS — Z803 Family history of malignant neoplasm of breast: Secondary | ICD-10-CM | POA: Diagnosis not present

## 2023-01-24 DIAGNOSIS — E119 Type 2 diabetes mellitus without complications: Secondary | ICD-10-CM | POA: Diagnosis not present

## 2023-01-24 DIAGNOSIS — E1169 Type 2 diabetes mellitus with other specified complication: Secondary | ICD-10-CM

## 2023-01-24 DIAGNOSIS — I10 Essential (primary) hypertension: Secondary | ICD-10-CM | POA: Insufficient documentation

## 2023-01-24 DIAGNOSIS — D696 Thrombocytopenia, unspecified: Secondary | ICD-10-CM

## 2023-01-24 DIAGNOSIS — Z800 Family history of malignant neoplasm of digestive organs: Secondary | ICD-10-CM | POA: Insufficient documentation

## 2023-01-24 DIAGNOSIS — Z806 Family history of leukemia: Secondary | ICD-10-CM | POA: Insufficient documentation

## 2023-01-24 LAB — CBC WITH DIFFERENTIAL/PLATELET
Abs Immature Granulocytes: 0.02 10*3/uL (ref 0.00–0.07)
Basophils Absolute: 0 10*3/uL (ref 0.0–0.1)
Basophils Relative: 1 %
Eosinophils Absolute: 0.2 10*3/uL (ref 0.0–0.5)
Eosinophils Relative: 3 %
HCT: 42.8 % (ref 36.0–46.0)
Hemoglobin: 14.3 g/dL (ref 12.0–15.0)
Immature Granulocytes: 0 %
Lymphocytes Relative: 29 %
Lymphs Abs: 1.7 10*3/uL (ref 0.7–4.0)
MCH: 30 pg (ref 26.0–34.0)
MCHC: 33.4 g/dL (ref 30.0–36.0)
MCV: 89.9 fL (ref 80.0–100.0)
Monocytes Absolute: 0.6 10*3/uL (ref 0.1–1.0)
Monocytes Relative: 9 %
Neutro Abs: 3.4 10*3/uL (ref 1.7–7.7)
Neutrophils Relative %: 58 %
Platelets: 124 10*3/uL — ABNORMAL LOW (ref 150–400)
RBC: 4.76 MIL/uL (ref 3.87–5.11)
RDW: 13.8 % (ref 11.5–15.5)
WBC: 6 10*3/uL (ref 4.0–10.5)
nRBC: 0 % (ref 0.0–0.2)

## 2023-01-24 LAB — COMPREHENSIVE METABOLIC PANEL
ALT: 18 U/L (ref 0–44)
AST: 23 U/L (ref 15–41)
Albumin: 3.9 g/dL (ref 3.5–5.0)
Alkaline Phosphatase: 72 U/L (ref 38–126)
Anion gap: 10 (ref 5–15)
BUN: 20 mg/dL (ref 6–20)
CO2: 24 mmol/L (ref 22–32)
Calcium: 9.4 mg/dL (ref 8.9–10.3)
Chloride: 105 mmol/L (ref 98–111)
Creatinine, Ser: 0.96 mg/dL (ref 0.44–1.00)
GFR, Estimated: >60 mL/min (ref >60)
Glucose, Bld: 109 mg/dL — ABNORMAL HIGH (ref 70–99)
Potassium: 3.7 mmol/L (ref 3.5–5.1)
Sodium: 139 mmol/L (ref 135–145)
Total Bilirubin: 0.7 mg/dL (ref 0.3–1.2)
Total Protein: 7.6 g/dL (ref 6.5–8.1)

## 2023-01-24 LAB — LACTATE DEHYDROGENASE: LDH: 174 U/L (ref 98–192)

## 2023-01-24 NOTE — Patient Instructions (Signed)
[facility] [place] **   You were seen today by Tarri Abernethy PA-C for your low platelets.    LOW PLATELETS Your low platelets are most likely related to underlying "fatty liver disease." You may also have some mild immune system dysfunction, meaning that your body will attack some of your platelets and cause them to be low. At this time, your platelets are only mildly low and do not require any specific treatment. We will continue to monitor your low platelets with repeat labs in 6 months and an office visit in 1 year.  FATTY LIVER DISEASE: This was seen on abdominal ultrasound and is when fatty tissue can infiltrate the liver.  This may be what is causing your platelets to be low.  The following instructions can help to decrease your fatty liver disease. Recommend 1-2# weight loss per week until ideal body weight through exercise & diet. Low fat/cholesterol diet.   Avoid sweets, sodas, fruit juices, sweetened beverages like tea, etc. Gradually increase exercise from 15 min daily up to 1 hr per day 5 days/week. Limit alcohol use.   FOLLOW-UP APPOINTMENT: Labs only in 6 months.  Same-day labs and office visit in 1 year.  ** Thank you for trusting me with your healthcare!  I strive to provide all of my patients with quality care at each visit.  If you receive a survey for this visit, I would be so grateful to you for taking the time to provide feedback.  Thank you in advance!  ~ Anorah Trias                   Dr. Derek Jack   &   Tarri Abernethy, PA-C   - - - - - - - - - - - - - - - - - -    Thank you for choosing [facility] to provide your oncology and hematology care.  To afford each patient quality time with our provider, please arrive at least 15 minutes before your scheduled appointment time.   If you have a lab appointment with the [facility] please come in thru  the Main Entrance and check in at the main information desk.  You need to re-schedule your appointment should you arrive 10 or more minutes late.  We strive to give you quality time with our providers, and arriving late affects you and other patients whose appointments are after yours.  Also, if you no show three or more times for appointments you may be dismissed from the clinic at the providers discretion.     Again, thank you for choosing [facility].  Our hope is that these requests will decrease the amount of time that you wait before being seen by our physicians.       _____________________________________________________________  Should you have questions after your visit to [facility], please contact our office at [phone] and follow the prompts.  Our office hours are 8:00 a.m. and 4:30 p.m. Monday - Friday.  Please note that voicemails left after 4:00 p.m. may not be returned until the following business day.  We are closed weekends and major holidays.  You do have access to a nurse 24-7, just call the main number to the clinic [phone] and do not press any options, hold on the line and a nurse will answer the phone.    For prescription refill requests,  have your pharmacy contact our office and allow 72 hours.

## 2023-01-27 ENCOUNTER — Other Ambulatory Visit: Payer: Self-pay

## 2023-01-27 DIAGNOSIS — D696 Thrombocytopenia, unspecified: Secondary | ICD-10-CM

## 2023-01-28 ENCOUNTER — Encounter: Payer: Self-pay | Admitting: Family Medicine

## 2023-01-28 ENCOUNTER — Ambulatory Visit (INDEPENDENT_AMBULATORY_CARE_PROVIDER_SITE_OTHER): Payer: Medicare Other | Admitting: Family Medicine

## 2023-01-28 VITALS — BP 138/89 | HR 75 | Temp 97.5°F | Ht 65.0 in | Wt 273.8 lb

## 2023-01-28 DIAGNOSIS — F4329 Adjustment disorder with other symptoms: Secondary | ICD-10-CM | POA: Diagnosis not present

## 2023-01-28 DIAGNOSIS — F321 Major depressive disorder, single episode, moderate: Secondary | ICD-10-CM

## 2023-01-28 DIAGNOSIS — F411 Generalized anxiety disorder: Secondary | ICD-10-CM | POA: Diagnosis not present

## 2023-01-28 DIAGNOSIS — E1169 Type 2 diabetes mellitus with other specified complication: Secondary | ICD-10-CM | POA: Diagnosis not present

## 2023-01-28 MED ORDER — SERTRALINE HCL 50 MG PO TABS: 50.0000 mg | ORAL_TABLET | Freq: Every day | ORAL | 3 refills | Status: DC

## 2023-01-28 MED ORDER — TRULICITY 1.5 MG/0.5ML ~~LOC~~ SOAJ
1.5000 mg | SUBCUTANEOUS | 0 refills | Status: DC
Start: 2023-01-30 — End: 2023-02-24

## 2023-01-28 NOTE — Patient Instructions (Signed)

## 2023-01-28 NOTE — Progress Notes (Signed)
Subjective:  Patient ID: Paula Butler, female    DOB: 09-Jan-1971, 52 y.o.   MRN: [id number]  Patient Care Team: Baruch Gouty, FNP as PCP - General (Family Medicine) Eloise Harman, DO as Consulting Physician (Gastroenterology)   Chief Complaint:  Depression (4 week follow up- patient states it is a little better ) and Anxiety (X 2 weeks)   HPI: Paula Butler is a 52 y.o. female presenting on 01/28/2023 for Depression (4 week follow up- patient states it is a little better ) and Anxiety (X 2 weeks)   1. Type 2 diabetes mellitus with other specified complication, without long-term current use of insulin ([place]) Has not been able to get Trulicity, would like printed RX today to take to additional pharmacies.   2. Depression, major, single episode, moderate ([place]) 3. Grief reaction with prolonged bereavement 4. GAD (generalized anxiety disorder) Has been doing well but is still symptomatic. Denies side effects from medications. Denies SI or HI.    01/28/2023    9:02 AM 12/27/2022    4:12 PM  GAD 7 : Generalized Anxiety Score  Nervous, Anxious, on Edge 2 0  Control/stop worrying 1 0  Worry too much - different things 1 0  Trouble relaxing 2 1  Restless 0 1  Easily annoyed or irritable 1 1  Afraid - awful might happen 1 0  Total GAD 7 Score 8 3  Anxiety Difficulty Not difficult at all Not difficult at all       01/28/2023    9:02 AM 12/27/2022    4:11 PM 08/05/2022   10:22 AM 06/12/2022    2:28 PM 06/04/2022   10:31 AM  Depression screen PHQ 2/9  Decreased Interest 1 0 0 1 0  Down, Depressed, Hopeless 1 1 0 1 0  PHQ - 2 Score 2 1 0 2 0  Altered sleeping 2 2  0   Tired, decreased energy 1 2  1   $ Change in appetite 0 1  0   Feeling bad or failure about yourself  0 0  1   Trouble concentrating 1 0  0   Moving slowly or fidgety/restless 0 0  0   Suicidal thoughts 0 0  0   PHQ-9 Score 6 6  4   $ Difficult doing work/chores Not difficult at all Not difficult at all   Not difficult at all         Relevant past medical, surgical, family, and social history reviewed and updated as indicated.  Allergies and medications reviewed and updated. Data reviewed: Chart in Epic.   Past Medical History:  Diagnosis Date   Diabetes mellitus without complication ([place])    Hyperlipidemia    Hypertension     Past Surgical History:  Procedure Laterality Date   CHOLECYSTECTOMY     ESOPHAGOGASTRODUODENOSCOPY  06/14/2009   Dr. Zeb Comfort; mild esophagitis biopsied, small gastric ulcer-healing s/p biopsied, gastritis biopsied, mild duodenitis biopsied.  Esophageal biopsy unremarkable, gastric and duodenal biopsy with no significant histopathologic features.   EXCISION OF BREAST BIOPSY Left 05/29/2018   Procedure: NIPPLE EXPLORATION/EXCISION RETROAREOLA TISSUE LEFT BREAST;  Surgeon: Virl Cagey, MD;  Location: [facility];  Service: General;  Laterality: Left;   TUBAL LIGATION      Social History   Socioeconomic History   Marital status: Married    Spouse name: Jose   Number of children: 3   Years of education: Not on file   Highest education level: Not  on file  Occupational History   Occupation: disability  Tobacco Use   Smoking status: Never   Smokeless tobacco: Never  Vaping Use   Vaping Use: Never used  Substance and Sexual Activity   Alcohol use: No   Drug use: No   Sexual activity: Not Currently    Partners: Male    Birth control/protection: Surgical    Comment: tubal  Other Topics Concern   Not on file  Social History Narrative   Married for 10 years, 3 children, all grown/teenagers.    2 daughters, 1 son - children live with her   Attends church.    Walks for exercise.   Enjoys singing.   Eats all food groups.   Wears seatbelt.   Born in [place], grew up in [place]. Father in [employer]. Grandmother raised her.    Moved from [place] in 2018.    Does not work out side of home.    Social Determinants of Health   Financial Resource Strain:  Low Risk  (06/12/2022)   Overall Financial Resource Strain (CARDIA)    Difficulty of Paying Living Expenses: Not hard at all  Food Insecurity: No Food Insecurity (06/12/2022)   Hunger Vital Sign    Worried About Running Out of Food in the Last Year: Never true    Ran Out of Food in the Last Year: Never true  Transportation Needs: No Transportation Needs (06/12/2022)   PRAPARE - Hydrologist (Medical): No    Lack of Transportation (Non-Medical): No  Physical Activity: Inactive (06/12/2022)   Exercise Vital Sign    Days of Exercise per Week: 0 days    Minutes of Exercise per Session: 0 min  Stress: No Stress Concern Present (06/12/2022)   [place]    Feeling of Stress : Not at all  Social Connections: Moderately Isolated (06/12/2022)   Social Connection and Isolation Panel [NHANES]    Frequency of Communication with Friends and Family: More than three times a week    Frequency of Social Gatherings with Friends and Family: More than three times a week    Attends Religious Services: Never    Marine scientist or Organizations: No    Attends Archivist Meetings: Never    Marital Status: Married  Human resources officer Violence: Not At Risk (06/12/2022)   Humiliation, Afraid, Rape, and Kick questionnaire    Fear of Current or Ex-Partner: No    Emotionally Abused: No    Physically Abused: No    Sexually Abused: No    Outpatient Encounter Medications as of 01/28/2023  Medication Sig   atorvastatin (LIPITOR) 20 MG tablet Take 1 tablet (20 mg total) by mouth daily.   cyclobenzaprine (FLEXERIL) 10 MG tablet Take 10 mg by mouth 3 (three) times daily as needed for muscle spasms.   lisinopril (ZESTRIL) 2.5 MG tablet Take 1 tablet (2.5 mg total) by mouth daily.   metFORMIN (GLUCOPHAGE XR) 500 MG 24 hr tablet Take 1 tablet (500 mg total) by mouth daily with breakfast.   sertraline (ZOLOFT) 50 MG  tablet Take 1 tablet (50 mg total) by mouth daily.   [DISCONTINUED] Dulaglutide (TRULICITY) 1.5 0000000 SOPN Inject 1.5 mg into the skin once a week for 16 doses.   [DISCONTINUED] sertraline (ZOLOFT) 25 MG tablet Take 1 tablet (25 mg total) by mouth daily.   [START ON 01/30/2023] Dulaglutide (TRULICITY) 1.5 0000000 SOPN Inject 1.5 mg into the skin  once a week for 16 doses.   [DISCONTINUED] methylPREDNISolone (MEDROL DOSEPAK) 4 MG TBPK tablet Take by mouth.   No facility-administered encounter medications on file as of 01/28/2023.    Allergies  Allergen Reactions   Fish Allergy Anaphylaxis, Shortness Of Breath and Rash   Shrimp [Shellfish Allergy] Anaphylaxis, Shortness Of Breath and Rash   Asa [Aspirin] Other (See Comments)    Abdominal swelling    Penicillins Hives, Itching and Rash    Has patient had a PCN reaction causing immediate rash, facial/tongue/throat swelling, SOB or lightheadedness with hypotension: Yes Has patient had a PCN reaction causing severe rash involving mucus membranes or skin necrosis: No Has patient had a PCN reaction that required hospitalization: Yes Has patient had a PCN reaction occurring within the last 10 years: Yes If all of the above answers are "NO", then may proceed with Cephalosporin use.     Review of Systems  Constitutional:  Positive for activity change, appetite change and fatigue. Negative for chills, diaphoresis, fever and unexpected weight change.  HENT: Negative.    Eyes: Negative.   Respiratory:  Negative for cough, chest tightness and shortness of breath.   Cardiovascular:  Negative for chest pain, palpitations and leg swelling.  Gastrointestinal:  Negative for abdominal pain, blood in stool, constipation, diarrhea, nausea and vomiting.  Endocrine: Negative.   Genitourinary:  Negative for decreased urine volume, difficulty urinating, dysuria, frequency and urgency.  Musculoskeletal:  Negative for arthralgias and myalgias.  Skin: Negative.    Allergic/Immunologic: Negative.   Neurological:  Negative for dizziness and headaches.  Hematological: Negative.   Psychiatric/Behavioral:  Positive for agitation, decreased concentration and sleep disturbance. Negative for behavioral problems, confusion, dysphoric mood, hallucinations, self-injury and suicidal ideas. The patient is nervous/anxious. The patient is not hyperactive.   All other systems reviewed and are negative.       Objective:  BP 138/89   Pulse 75   Temp (!) 97.5 F (36.4 C) (Temporal)   Ht 5' 5"$  (1.651 m)   Wt 273 lb 12.8 oz (124.2 kg)   SpO2 93%   BMI 45.56 kg/m    Wt Readings from Last 3 Encounters:  01/28/23 273 lb 12.8 oz (124.2 kg)  01/24/23 272 lb 0.8 oz (123.4 kg)  12/27/22 274 lb 9.6 oz (124.6 kg)    Physical Exam Vitals and nursing note reviewed.  Constitutional:      General: She is not in acute distress.    Appearance: Normal appearance. She is well-developed and well-groomed. She is obese. She is not ill-appearing, toxic-appearing or diaphoretic.  HENT:     Head: Normocephalic and atraumatic.     Jaw: There is normal jaw occlusion.     Right Ear: Hearing normal.     Left Ear: Hearing normal.     Nose: Nose normal.     Mouth/Throat:     Lips: Pink.     Mouth: Mucous membranes are moist.     Pharynx: Uvula midline.  Eyes:     General: Lids are normal.     Pupils: Pupils are equal, round, and reactive to light.  Neck:     Thyroid: No thyroid mass, thyromegaly or thyroid tenderness.     Vascular: No carotid bruit or JVD.     Trachea: Trachea and phonation normal.  Cardiovascular:     Rate and Rhythm: Normal rate and regular rhythm.     Chest Wall: PMI is not displaced.     Pulses: Normal pulses.  Heart sounds: Normal heart sounds. No murmur heard.    No friction rub. No gallop.  Pulmonary:     Effort: Pulmonary effort is normal. No respiratory distress.     Breath sounds: Normal breath sounds. No wheezing.  Abdominal:      General: There is no abdominal bruit.     Palpations: There is no hepatomegaly or splenomegaly.  Musculoskeletal:        General: Normal range of motion.     Cervical back: Normal range of motion and neck supple.     Right lower leg: No edema.     Left lower leg: No edema.  Lymphadenopathy:     Cervical: No cervical adenopathy.  Skin:    General: Skin is warm and dry.     Capillary Refill: Capillary refill takes less than 2 seconds.     Coloration: Skin is not cyanotic, jaundiced or pale.     Findings: No rash.  Neurological:     General: No focal deficit present.     Mental Status: She is alert and oriented to person, place, and time.     Sensory: Sensation is intact.     Motor: Motor function is intact.     Coordination: Coordination is intact.     Gait: Gait is intact.     Deep Tendon Reflexes: Reflexes are normal and symmetric.  Psychiatric:        Attention and Perception: Attention and perception normal.        Mood and Affect: Mood and affect normal.        Speech: Speech normal.        Behavior: Behavior normal. Behavior is cooperative.        Thought Content: Thought content normal.        Cognition and Memory: Cognition and memory normal.        Judgment: Judgment normal.     Results for orders placed or performed in visit on 01/24/23  Lactate dehydrogenase  Result Value Ref Range   LDH 174 98 - 192 U/L  Comprehensive metabolic panel  Result Value Ref Range   Sodium 139 135 - 145 mmol/L   Potassium 3.7 3.5 - 5.1 mmol/L   Chloride 105 98 - 111 mmol/L   CO2 24 22 - 32 mmol/L   Glucose, Bld 109 (H) 70 - 99 mg/dL   BUN 20 6 - 20 mg/dL   Creatinine, Ser 0.96 0.44 - 1.00 mg/dL   Calcium 9.4 8.9 - 10.3 mg/dL   Total Protein 7.6 6.5 - 8.1 g/dL   Albumin 3.9 3.5 - 5.0 g/dL   AST 23 15 - 41 U/L   ALT 18 0 - 44 U/L   Alkaline Phosphatase 72 38 - 126 U/L   Total Bilirubin 0.7 0.3 - 1.2 mg/dL   GFR, Estimated >60 >60 mL/min   Anion gap 10 5 - 15  CBC with  Differential/Platelet  Result Value Ref Range   WBC 6.0 4.0 - 10.5 K/uL   RBC 4.76 3.87 - 5.11 MIL/uL   Hemoglobin 14.3 12.0 - 15.0 g/dL   HCT 42.8 36.0 - 46.0 %   MCV 89.9 80.0 - 100.0 fL   MCH 30.0 26.0 - 34.0 pg   MCHC 33.4 30.0 - 36.0 g/dL   RDW 13.8 11.5 - 15.5 %   Platelets 124 (L) 150 - 400 K/uL   nRBC 0.0 0.0 - 0.2 %   Neutrophils Relative % 58 %   Neutro Abs 3.4 1.7 -  7.7 K/uL   Lymphocytes Relative 29 %   Lymphs Abs 1.7 0.7 - 4.0 K/uL   Monocytes Relative 9 %   Monocytes Absolute 0.6 0.1 - 1.0 K/uL   Eosinophils Relative 3 %   Eosinophils Absolute 0.2 0.0 - 0.5 K/uL   Basophils Relative 1 %   Basophils Absolute 0.0 0.0 - 0.1 K/uL   Immature Granulocytes 0 %   Abs Immature Granulocytes 0.02 0.00 - 0.07 K/uL       Pertinent labs & imaging results that were available during my care of the patient were reviewed by me and considered in my medical decision making.  Assessment & Plan:  Tennille was seen today for depression and anxiety.  Diagnoses and all orders for this visit:  Depression, major, single episode, moderate ([facility]) Grief reaction with prolonged bereavement GAD (generalized anxiety disorder) Doing ok in current regimen but still symptomatic. Will increase dosing to 50 mg daily. Follow up in 2 months for reevaluation, sooner if warranted.  -     sertraline (ZOLOFT) 50 MG tablet; Take 1 tablet (50 mg total) by mouth daily.  Type 2 diabetes mellitus with other specified complication, without long-term current use of insulin ([place]) Wanted a printed prescription as she has not been able to get at her pharmacy.  -     Dulaglutide (TRULICITY) 1.5 0000000 SOPN; Inject 1.5 mg into the skin once a week for 16 doses.     Continue all other maintenance medications.  Follow up plan: Return in about 2 months (around 03/29/2023) for DM.   Continue healthy lifestyle choices, including diet (rich in fruits, vegetables, and lean proteins, and low in salt and simple  carbohydrates) and exercise (at least 30 minutes of moderate physical activity daily).  Educational handout given for depression, crisis intervention hotlines  The above assessment and management plan was discussed with the patient. The patient verbalized understanding of and has agreed to the management plan. Patient is aware to Butler the clinic if they develop any new symptoms or if symptoms persist or worsen. Patient is aware when to return to the clinic for a follow-up visit. Patient educated on when it is appropriate to go to the emergency department.   Monia Pouch, FNP-C [facility] [phone]

## 2023-01-29 ENCOUNTER — Emergency Department (HOSPITAL_COMMUNITY): Payer: Medicare Other

## 2023-01-29 ENCOUNTER — Encounter (HOSPITAL_COMMUNITY): Payer: Self-pay

## 2023-01-29 ENCOUNTER — Other Ambulatory Visit: Payer: Self-pay

## 2023-01-29 ENCOUNTER — Emergency Department (HOSPITAL_COMMUNITY)
Admission: EM | Admit: 2023-01-29 | Discharge: 2023-01-29 | Disposition: A | Discharge status: Home / Self Care | Payer: Medicare Other | Attending: Emergency Medicine | Admitting: Emergency Medicine

## 2023-01-29 DIAGNOSIS — M545 Low back pain, unspecified: Secondary | ICD-10-CM | POA: Diagnosis not present

## 2023-01-29 DIAGNOSIS — Y92410 Unspecified street and highway as the place of occurrence of the external cause: Secondary | ICD-10-CM | POA: Diagnosis not present

## 2023-01-29 DIAGNOSIS — Z7984 Long term (current) use of oral hypoglycemic drugs: Secondary | ICD-10-CM | POA: Insufficient documentation

## 2023-01-29 DIAGNOSIS — S3992XA Unspecified injury of lower back, initial encounter: Secondary | ICD-10-CM | POA: Diagnosis present

## 2023-01-29 DIAGNOSIS — S39012A Strain of muscle, fascia and tendon of lower back, initial encounter: Secondary | ICD-10-CM | POA: Insufficient documentation

## 2023-01-29 LAB — POC URINE PREG, ED: Preg Test, Ur: NEGATIVE

## 2023-01-29 MED ORDER — NAPROXEN 500 MG PO TABS: 500.0000 mg | ORAL_TABLET | Freq: Two times a day (BID) | ORAL | 0 refills | Status: DC

## 2023-01-29 MED ORDER — NAPROXEN 250 MG PO TABS
500.0000 mg | ORAL_TABLET | Freq: Once | ORAL | Status: AC
  Administered 2023-01-29: 500 mg via ORAL
  Filled 2023-01-29: qty 2

## 2023-01-29 MED ORDER — METHOCARBAMOL 500 MG PO TABS: 500.0000 mg | ORAL_TABLET | Freq: Two times a day (BID) | ORAL | 0 refills | Status: DC

## 2023-01-29 MED ORDER — HYDROCODONE-ACETAMINOPHEN 5-325 MG PO TABS
1.0000 | ORAL_TABLET | Freq: Once | ORAL | Status: AC
  Administered 2023-01-29: 1 via ORAL
  Filled 2023-01-29: qty 1
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
## 2023-01-29 NOTE — Discharge Instructions (Addendum)
You are seen in the emergency room for back pain.  The x-ray of your spine is negative for any fracture.  We suspect that you might have bruising to your back, or more likely lumbar strain.  Recommend that you take the medications that are prescribed.  Apply cold compresses tomorrow to the back, thereafter you can alternate between heat and ice compresses.

## 2023-01-31 ENCOUNTER — Telehealth: Payer: Self-pay | Admitting: Family Medicine

## 2023-01-31 NOTE — Telephone Encounter (Signed)
Patient aware.  Will call back when she is able to find in stock.

## 2023-02-11 ENCOUNTER — Other Ambulatory Visit (HOSPITAL_COMMUNITY): Payer: Self-pay | Admitting: Family Medicine

## 2023-02-11 DIAGNOSIS — R921 Mammographic calcification found on diagnostic imaging of breast: Secondary | ICD-10-CM

## 2023-02-13 ENCOUNTER — Encounter: Payer: Self-pay | Admitting: Radiology

## 2023-02-24 ENCOUNTER — Other Ambulatory Visit: Payer: Self-pay | Admitting: Family Medicine

## 2023-02-24 DIAGNOSIS — E1169 Type 2 diabetes mellitus with other specified complication: Secondary | ICD-10-CM

## 2023-02-24 MED ORDER — TRULICITY 1.5 MG/0.5ML ~~LOC~~ SOAJ: 1.5000 mg | SUBCUTANEOUS | 0 refills | Status: DC

## 2023-02-25 ENCOUNTER — Ambulatory Visit: Payer: Medicare Other | Admitting: "Endocrinology

## 2023-02-26 ENCOUNTER — Encounter: Payer: Self-pay | Admitting: Family Medicine

## 2023-02-26 DIAGNOSIS — E1169 Type 2 diabetes mellitus with other specified complication: Secondary | ICD-10-CM

## 2023-02-27 MED ORDER — TRULICITY 1.5 MG/0.5ML ~~LOC~~ SOAJ: 1.5000 mg | SUBCUTANEOUS | 0 refills | Status: DC

## 2023-03-02 ENCOUNTER — Other Ambulatory Visit: Payer: Self-pay | Admitting: Family Medicine

## 2023-03-02 DIAGNOSIS — E1169 Type 2 diabetes mellitus with other specified complication: Secondary | ICD-10-CM

## 2023-03-02 DIAGNOSIS — I152 Hypertension secondary to endocrine disorders: Secondary | ICD-10-CM

## 2023-03-04 ENCOUNTER — Ambulatory Visit (HOSPITAL_COMMUNITY): Payer: Medicare Other

## 2023-03-04 ENCOUNTER — Encounter (HOSPITAL_COMMUNITY): Payer: Medicare Other

## 2023-04-01 ENCOUNTER — Encounter: Payer: Self-pay | Admitting: Family Medicine

## 2023-04-01 ENCOUNTER — Ambulatory Visit (INDEPENDENT_AMBULATORY_CARE_PROVIDER_SITE_OTHER): Payer: Medicare Other | Admitting: Family Medicine

## 2023-04-01 VITALS — BP 174/104 | HR 71 | Temp 98.2°F | Ht 65.0 in | Wt 273.2 lb

## 2023-04-01 DIAGNOSIS — Z6841 Body Mass Index (BMI) 40.0 and over, adult: Secondary | ICD-10-CM | POA: Diagnosis not present

## 2023-04-01 DIAGNOSIS — Z7984 Long term (current) use of oral hypoglycemic drugs: Secondary | ICD-10-CM

## 2023-04-01 DIAGNOSIS — E1169 Type 2 diabetes mellitus with other specified complication: Secondary | ICD-10-CM | POA: Diagnosis not present

## 2023-04-01 DIAGNOSIS — M79671 Pain in right foot: Secondary | ICD-10-CM

## 2023-04-01 DIAGNOSIS — F411 Generalized anxiety disorder: Secondary | ICD-10-CM

## 2023-04-01 DIAGNOSIS — I152 Hypertension secondary to endocrine disorders: Secondary | ICD-10-CM

## 2023-04-01 DIAGNOSIS — E1159 Type 2 diabetes mellitus with other circulatory complications: Secondary | ICD-10-CM

## 2023-04-01 DIAGNOSIS — M79672 Pain in left foot: Secondary | ICD-10-CM

## 2023-04-01 DIAGNOSIS — E785 Hyperlipidemia, unspecified: Secondary | ICD-10-CM | POA: Diagnosis not present

## 2023-04-01 DIAGNOSIS — Z7985 Long-term (current) use of injectable non-insulin antidiabetic drugs: Secondary | ICD-10-CM

## 2023-04-01 DIAGNOSIS — F4329 Adjustment disorder with other symptoms: Secondary | ICD-10-CM

## 2023-04-01 DIAGNOSIS — F321 Major depressive disorder, single episode, moderate: Secondary | ICD-10-CM | POA: Diagnosis not present

## 2023-04-01 LAB — BAYER DCA HB A1C WAIVED: HB A1C (BAYER DCA - WAIVED): 5.7 % — ABNORMAL HIGH (ref 4.8–5.6)

## 2023-04-01 MED ORDER — SERTRALINE HCL 50 MG PO TABS: 50.0000 mg | ORAL_TABLET | Freq: Every day | ORAL | 3 refills | Status: DC

## 2023-04-01 MED ORDER — TRULICITY 1.5 MG/0.5ML ~~LOC~~ SOAJ: 1.5000 mg | SUBCUTANEOUS | 0 refills | Status: DC

## 2023-04-01 MED ORDER — LISINOPRIL 20 MG PO TABS: 20.0000 mg | ORAL_TABLET | Freq: Every day | ORAL | 3 refills | Status: DC

## 2023-04-01 MED ORDER — METFORMIN HCL ER 500 MG PO TB24: 500.0000 mg | ORAL_TABLET | Freq: Every day | ORAL | 1 refills | Status: DC

## 2023-04-01 NOTE — Progress Notes (Signed)
Subjective:  Patient ID: Paula Butler, female    DOB: 24-Jul-1971, 52 y.o.   MRN: [id number]  Patient Care Team: Sonny Masters, FNP as PCP - General (Family Medicine) Lanelle Bal, DO as Consulting Physician (Gastroenterology)   Chief Complaint:  Diabetes (2 month follow up )   HPI: Paula Butler is a 52 y.o. female presenting on 04/01/2023 for Diabetes (2 month follow up )   1. Type 2 diabetes mellitus with other specified complication, without long-term current use of insulin Diabetes Mellitus Type II, Follow-up  Lab Results  Component Value Date   HGBA1C 6.3 (H) 12/27/2022   HGBA1C 5.9 (H) 06/04/2022   HGBA1C 5.8 01/19/2019   Wt Readings from Last 3 Encounters:  04/01/23 273 lb 3.2 oz (123.9 kg)  01/29/23 273 lb 12.8 oz (124.2 kg)  01/28/23 273 lb 12.8 oz (124.2 kg)   Last seen for diabetes 3 months ago.  Management since then includes trulicity. She reports excellent compliance with treatment. She is not having side effects.  Symptoms: No fatigue No foot ulcerations  No appetite changes No nausea  No paresthesia of the feet  No polydipsia  No polyuria No visual disturbances   No vomiting     Home blood sugar records:  not checking   Most Recent Eye Exam: 2019 Current exercise: none Current diet habits: no specific diet  Pertinent Labs: Lab Results  Component Value Date   CHOL 296 (H) 12/27/2022   HDL 49 12/27/2022   LDLCALC 198 (H) 12/27/2022   TRIG 253 (H) 12/27/2022   CHOLHDL 6.0 (H) 12/27/2022   Lab Results  Component Value Date   NA 139 01/24/2023   K 3.7 01/24/2023   CREATININE 0.96 01/24/2023   GFRNONAA >60 01/24/2023   LABMICR See below: 06/04/2022   MICRALBCREAT 8 06/04/2022     --------------------------------------------------------------------------------------------------- Hypertension, follow-up  BP Readings from Last 3 Encounters:  04/01/23 (!) 174/104  01/29/23 (!) 140/93  01/28/23 138/89   Wt Readings from  Last 3 Encounters:  04/01/23 273 lb 3.2 oz (123.9 kg)  01/29/23 273 lb 12.8 oz (124.2 kg)  01/28/23 273 lb 12.8 oz (124.2 kg)     She was last seen for hypertension 3 months ago.  BP at that visit was 140/93. Management since that visit includes lisinopril.  She reports good compliance with treatment. She is not having side effects.  She is following a Regular diet. She is not exercising. She does not smoke.  Use of agents associated with hypertension: none.   Symptoms: No chest pain No chest pressure  No palpitations No syncope  No dyspnea No orthopnea  No paroxysmal nocturnal dyspnea No lower extremity edema   Pertinent labs Lab Results  Component Value Date   CHOL 296 (H) 12/27/2022   HDL 49 12/27/2022   LDLCALC 198 (H) 12/27/2022   TRIG 253 (H) 12/27/2022   CHOLHDL 6.0 (H) 12/27/2022   Lab Results  Component Value Date   NA 139 01/24/2023   K 3.7 01/24/2023   CREATININE 0.96 01/24/2023   GFRNONAA >60 01/24/2023   GLUCOSE 109 (H) 01/24/2023   TSH 4.310 12/27/2022     The 10-year ASCVD risk score (Arnett DK, et al., 2019) is: 10.7%  ---------------------------------------------------------------------------------------------------  Lipid/Cholesterol, Follow-up  Last lipid panel Other pertinent labs  Lab Results  Component Value Date   CHOL 296 (H) 12/27/2022   HDL 49 12/27/2022   LDLCALC 198 (H) 12/27/2022   TRIG 253 (H)  12/27/2022   CHOLHDL 6.0 (H) 12/27/2022   Lab Results  Component Value Date   ALT 18 01/24/2023   AST 23 01/24/2023   PLT 124 (L) 01/24/2023   TSH 4.310 12/27/2022     She was last seen for this 3 months ago.  Management since that visit includes atorvastatin.  She reports good compliance with treatment. She is not having side effects.   Symptoms: No chest pain No chest pressure/discomfort  No dyspnea No lower extremity edema  No numbness or tingling of extremity No orthopnea  No palpitations No paroxysmal nocturnal dyspnea   No speech difficulty No syncope   Current diet:  does not follow a diet Current exercise: none  The 10-year ASCVD risk score (Arnett DK, et al., 2019) is: 10.7%  ---------------------------------------------------------------------------------------------------  GAD (generalized anxiety disorder) Depression, major, single episode, moderate Grief reaction with prolonged bereavement Has been taking zoloft as prescribed and feels this has been helpful. No SI or HI.    04/01/2023   10:51 AM 01/28/2023    9:02 AM 12/27/2022    4:11 PM 08/05/2022   10:22 AM 06/12/2022    2:28 PM  Depression screen PHQ 2/9  Decreased Interest 1 1 0 0 1  Down, Depressed, Hopeless 1 1 1  0 1  PHQ - 2 Score 2 2 1  0 2  Altered sleeping 1 2 2   0  Tired, decreased energy 1 1 2  1   Change in appetite 0 0 1  0  Feeling bad or failure about yourself  0 0 0  1  Trouble concentrating 0 1 0  0  Moving slowly or fidgety/restless 0 0 0  0  Suicidal thoughts 0 0 0  0  PHQ-9 Score 4 6 6  4   Difficult doing work/chores Not difficult at all Not difficult at all Not difficult at all  Not difficult at all      04/01/2023   10:51 AM 01/28/2023    9:02 AM 12/27/2022    4:12 PM  GAD 7 : Generalized Anxiety Score  Nervous, Anxious, on Edge 1 2 0  Control/stop worrying 1 1 0  Worry too much - different things 0 1 0  Trouble relaxing 1 2 1   Restless 0 0 1  Easily annoyed or irritable 1 1 1   Afraid - awful might happen 0 1 0  Total GAD 7 Score 4 8 3   Anxiety Difficulty Not difficult at all Not difficult at all Not difficult at all         Relevant past medical, surgical, family, and social history reviewed and updated as indicated.  Allergies and medications reviewed and updated. Data reviewed: Chart in Epic.   Past Medical History:  Diagnosis Date   Diabetes mellitus without complication    Hyperlipidemia    Hypertension     Past Surgical History:  Procedure Laterality Date   CHOLECYSTECTOMY      ESOPHAGOGASTRODUODENOSCOPY  06/14/2009   Dr. Otho Najjar; mild esophagitis biopsied, small gastric ulcer-healing s/p biopsied, gastritis biopsied, mild duodenitis biopsied.  Esophageal biopsy unremarkable, gastric and duodenal biopsy with no significant histopathologic features.   EXCISION OF BREAST BIOPSY Left 05/29/2018   Procedure: NIPPLE EXPLORATION/EXCISION RETROAREOLA TISSUE LEFT BREAST;  Surgeon: Lucretia Roers, MD;  Location: [facility];  Service: General;  Laterality: Left;   TUBAL LIGATION      Social History   Socioeconomic History   Marital status: Married    Spouse name: Jose   Number of children: 3  Years of education: Not on file   Highest education level: Not on file  Occupational History   Occupation: disability  Tobacco Use   Smoking status: Never   Smokeless tobacco: Never  Vaping Use   Vaping Use: Never used  Substance and Sexual Activity   Alcohol use: No   Drug use: No   Sexual activity: Not Currently    Partners: Male    Birth control/protection: Surgical    Comment: tubal  Other Topics Concern   Not on file  Social History Narrative   Married for 10 years, 3 children, all grown/teenagers.    2 daughters, 1 son - children live with her   Attends church.    Walks for exercise.   Enjoys singing.   Eats all food groups.   Wears seatbelt.   Born in [place], grew up in [place]. Father in [employer]. Grandmother raised her.    Moved from [place] in 2018.    Does not work out side of home.    Social Determinants of Health   Financial Resource Strain: Low Risk  (06/12/2022)   Overall Financial Resource Strain (CARDIA)    Difficulty of Paying Living Expenses: Not hard at all  Food Insecurity: No Food Insecurity (06/12/2022)   Hunger Vital Sign    Worried About Running Out of Food in the Last Year: Never true    Ran Out of Food in the Last Year: Never true  Transportation Needs: No Transportation Needs (06/12/2022)   PRAPARE - Scientist, research (physical sciences) (Medical): No    Lack of Transportation (Non-Medical): No  Physical Activity: Inactive (06/12/2022)   Exercise Vital Sign    Days of Exercise per Week: 0 days    Minutes of Exercise per Session: 0 min  Stress: No Stress Concern Present (06/12/2022)   [employer] of Occupational Health - Occupational Stress Questionnaire    Feeling of Stress : Not at all  Social Connections: Moderately Isolated (06/12/2022)   Social Connection and Isolation Panel [NHANES]    Frequency of Communication with Friends and Family: More than three times a week    Frequency of Social Gatherings with Friends and Family: More than three times a week    Attends Religious Services: Never    Database administrator or Organizations: No    Attends Banker Meetings: Never    Marital Status: Married  Catering manager Violence: Not At Risk (06/12/2022)   Humiliation, Afraid, Rape, and Kick questionnaire    Fear of Current or Ex-Partner: No    Emotionally Abused: No    Physically Abused: No    Sexually Abused: No    Outpatient Encounter Medications as of 04/01/2023  Medication Sig   atorvastatin (LIPITOR) 20 MG tablet Take 1 tablet (20 mg total) by mouth daily.   lisinopril (ZESTRIL) 20 MG tablet Take 1 tablet (20 mg total) by mouth daily.   methocarbamol (ROBAXIN) 500 MG tablet Take 1 tablet (500 mg total) by mouth 2 (two) times daily.   naproxen (NAPROSYN) 500 MG tablet Take 1 tablet (500 mg total) by mouth 2 (two) times daily.   [DISCONTINUED] Dulaglutide (TRULICITY) 1.5 MG/0.5ML SOPN Inject 1.5 mg into the skin once a week for 16 doses.   [DISCONTINUED] lisinopril (ZESTRIL) 2.5 MG tablet Take 1 tablet by mouth once daily   [DISCONTINUED] metFORMIN (GLUCOPHAGE XR) 500 MG 24 hr tablet Take 1 tablet (500 mg total) by mouth daily with breakfast.   [DISCONTINUED] sertraline (ZOLOFT)  50 MG tablet Take 1 tablet (50 mg total) by mouth daily. (Patient taking differently: Take 25 mg by mouth  daily.)   Dulaglutide (TRULICITY) 1.5 MG/0.5ML SOPN Inject 1.5 mg into the skin once a week for 16 doses.   metFORMIN (GLUCOPHAGE XR) 500 MG 24 hr tablet Take 1 tablet (500 mg total) by mouth daily with breakfast.   sertraline (ZOLOFT) 50 MG tablet Take 1 tablet (50 mg total) by mouth daily.   [DISCONTINUED] Dulaglutide (TRULICITY) 0.75 MG/0.5ML SOPN Inject 0.75 mg into the skin once a week.   No facility-administered encounter medications on file as of 04/01/2023.    Allergies  Allergen Reactions   Fish Allergy Anaphylaxis, Shortness Of Breath and Rash   Shrimp [Shellfish Allergy] Anaphylaxis, Shortness Of Breath and Rash   Asa [Aspirin] Other (See Comments)    Abdominal swelling    Penicillins Hives, Itching and Rash    Has patient had a PCN reaction causing immediate rash, facial/tongue/throat swelling, SOB or lightheadedness with hypotension: Yes Has patient had a PCN reaction causing severe rash involving mucus membranes or skin necrosis: No Has patient had a PCN reaction that required hospitalization: Yes Has patient had a PCN reaction occurring within the last 10 years: Yes If all of the above answers are "NO", then may proceed with Cephalosporin use.     Review of Systems  Constitutional:  Positive for fatigue. Negative for activity change, appetite change, chills, diaphoresis, fever and unexpected weight change.  HENT: Negative.    Eyes: Negative.  Negative for photophobia and visual disturbance.  Respiratory:  Negative for cough, chest tightness and shortness of breath.   Cardiovascular:  Negative for chest pain, palpitations and leg swelling.  Gastrointestinal:  Negative for abdominal pain, blood in stool, constipation, diarrhea, nausea and vomiting.  Endocrine: Negative.  Negative for polyphagia and polyuria.  Genitourinary:  Negative for decreased urine volume, dysuria, frequency and urgency.  Musculoskeletal:  Positive for arthralgias (bilateral feet) and myalgias  (bilateral feet).  Skin: Negative.   Allergic/Immunologic: Negative.   Neurological:  Negative for dizziness, tremors, seizures, syncope, facial asymmetry, speech difficulty, weakness, light-headedness, numbness and headaches.  Hematological: Negative.   Psychiatric/Behavioral:  Positive for sleep disturbance. Negative for agitation, behavioral problems, confusion, decreased concentration, dysphoric mood, hallucinations, self-injury and suicidal ideas. The patient is nervous/anxious. The patient is not hyperactive.   All other systems reviewed and are negative.       Objective:  BP (!) 174/104   Pulse 71   Temp 98.2 F (36.8 C) (Temporal)   Ht  (1.651 m)   Wt 273 lb 3.2 oz (123.9 kg)   SpO2 95%   BMI 45.46 kg/m    Wt Readings from Last 3 Encounters:  04/01/23 273 lb 3.2 oz (123.9 kg)  01/29/23 273 lb 12.8 oz (124.2 kg)  01/28/23 273 lb 12.8 oz (124.2 kg)    Physical Exam Vitals and nursing note reviewed.  Constitutional:      General: She is not in acute distress.    Appearance: Normal appearance. She is well-developed and well-groomed. She is morbidly obese. She is not ill-appearing, toxic-appearing or diaphoretic.  HENT:     Head: Normocephalic and atraumatic.     Jaw: There is normal jaw occlusion.     Right Ear: Hearing normal.     Left Ear: Hearing normal.     Nose: Nose normal.     Mouth/Throat:     Lips: Pink.     Mouth: Mucous membranes are  moist.     Pharynx: Oropharynx is clear. Uvula midline.  Eyes:     General: Lids are normal.     Extraocular Movements: Extraocular movements intact.     Conjunctiva/sclera: Conjunctivae normal.     Pupils: Pupils are equal, round, and reactive to light.  Neck:     Thyroid: No thyroid mass, thyromegaly or thyroid tenderness.     Vascular: No carotid bruit or JVD.     Trachea: Trachea and phonation normal.  Cardiovascular:     Rate and Rhythm: Normal rate and regular rhythm.     Chest Wall: PMI is not displaced.      Pulses: Normal pulses.          Dorsalis pedis pulses are 2+ on the right side and 2+ on the left side.       Posterior tibial pulses are 2+ on the right side and 2+ on the left side.     Heart sounds: Normal heart sounds. No murmur heard.    No friction rub. No gallop.  Pulmonary:     Effort: Pulmonary effort is normal. No respiratory distress.     Breath sounds: Normal breath sounds. No wheezing.  Abdominal:     General: Bowel sounds are normal. There is no distension or abdominal bruit.     Palpations: Abdomen is soft. There is no hepatomegaly or splenomegaly.     Tenderness: There is no abdominal tenderness. There is no right CVA tenderness or left CVA tenderness.     Hernia: No hernia is present.  Musculoskeletal:        General: Normal range of motion.     Cervical back: Normal range of motion and neck supple.     Right lower leg: No edema.     Left lower leg: No edema.       Feet:  Lymphadenopathy:     Cervical: No cervical adenopathy.  Skin:    General: Skin is warm and dry.     Capillary Refill: Capillary refill takes less than 2 seconds.     Coloration: Skin is not cyanotic, jaundiced or pale.     Findings: No rash.  Neurological:     General: No focal deficit present.     Mental Status: She is alert and oriented to person, place, and time.     Sensory: Sensation is intact.     Motor: Motor function is intact.     Coordination: Coordination is intact.     Gait: Gait is intact.     Deep Tendon Reflexes: Reflexes are normal and symmetric.  Psychiatric:        Attention and Perception: Attention and perception normal.        Mood and Affect: Mood and affect normal.        Speech: Speech normal.        Behavior: Behavior normal. Behavior is cooperative.        Thought Content: Thought content normal.        Cognition and Memory: Cognition and memory normal.        Judgment: Judgment normal.     Results for orders placed or performed during the hospital encounter  of 01/29/23  POC Urine Pregnancy, ED (not at [facility] or [facility])  Result Value Ref Range   Preg Test, Ur Negative Negative       Pertinent labs & imaging results that were available during my care of the patient were reviewed by me and considered in my medical  decision making.  Assessment & Plan:  Zanaria was seen today for diabetes.  Diagnoses and all orders for this visit:  Type 2 diabetes mellitus with other specified complication, without long-term current use of insulin A1C well controlled. No changes to regimen. Diet and exercise encouraged.  -     Bayer DCA Hb A1c Waived -     metFORMIN (GLUCOPHAGE XR) 500 MG 24 hr tablet; Take 1 tablet (500 mg total) by mouth daily with breakfast. -     Dulaglutide (TRULICITY) 1.5 MG/0.5ML SOPN; Inject 1.5 mg into the skin once a week for 16 doses.  Hypertension associated with diabetes BP not controlled. Changes were made in regimen today, lisinopril dosing increased. Goal BP is 130/80. Pt aware to report any persistent high or low readings. DASH diet and exercise encouraged. Exercise at least 150 minutes per week and increase as tolerated. Goal BMI > 25. Stress management encouraged. Avoid nicotine and tobacco product use. Avoid excessive alcohol and NSAID's. Avoid more than 2000 mg of sodium daily. Medications as prescribed. Follow up as scheduled.  -     CBC with Differential/Platelet -     CMP14+EGFR -     lisinopril (ZESTRIL) 20 MG tablet; Take 1 tablet (20 mg total) by mouth daily.  Hyperlipidemia associated with type 2 diabetes mellitus Diet encouraged - increase intake of fresh fruits and vegetables, increase intake of lean proteins. Bake, broil, or grill foods. Avoid fried, greasy, and fatty foods. Avoid fast foods. Increase intake of fiber-rich whole grains. Exercise encouraged - at least 150 minutes per week and advance as tolerated.  Goal BMI < 25. Continue medications as prescribed. Follow up in 3-6 months as discussed.  -     Lipid  panel  GAD (generalized anxiety disorder) Depression, major, single episode, moderate Grief reaction with prolonged bereavement Doing well on below, will continue.  -     sertraline (ZOLOFT) 50 MG tablet; Take 1 tablet (50 mg total) by mouth daily.  Morbid obesity Would like referral to bariatric surgery.  -     Amb Referral to Bariatric Surgery  Bilateral foot pain Concerns for bilateral plantar fasciitis. Symptomatic care discussed in detail along with icing and stretching exercises. Will refer to podiatry for evaluation.  -     Ambulatory referral to Podiatry     Continue all other maintenance medications.  Follow up plan: Return in about 4 weeks (around 04/29/2023) for BP.   Continue healthy lifestyle choices, including diet (rich in fruits, vegetables, and lean proteins, and low in salt and simple carbohydrates) and exercise (at least 30 minutes of moderate physical activity daily).  Educational handout given for DM  The above assessment and management plan was discussed with the patient. The patient verbalized understanding of and has agreed to the management plan. Patient is aware to call the clinic if they develop any new symptoms or if symptoms persist or worsen. Patient is aware when to return to the clinic for a follow-up visit. Patient educated on when it is appropriate to go to the emergency department.   Kari Baars, FNP-C [facility] [phone]

## 2023-04-01 NOTE — Patient Instructions (Addendum)

## 2023-04-01 NOTE — Addendum Note (Signed)
Addended by: Richell Corker M on: 04/01/2023 04:26 PM   Modules accepted: Level of Service  

## 2023-04-02 LAB — LIPID PANEL
Chol/HDL Ratio: 5.4 ratio — ABNORMAL HIGH (ref 0.0–4.4)
Cholesterol, Total: 217 mg/dL — ABNORMAL HIGH (ref 100–199)
HDL: 40 mg/dL (ref >39)
LDL Chol Calc (NIH): 136 mg/dL — ABNORMAL HIGH (ref 0–99)
Triglycerides: 228 mg/dL — ABNORMAL HIGH (ref 0–149)
VLDL Cholesterol Cal: 41 mg/dL — ABNORMAL HIGH (ref 5–40)

## 2023-04-02 LAB — CBC WITH DIFFERENTIAL/PLATELET
Basophils Absolute: 0.1 10*3/uL (ref 0.0–0.2)
Basos: 1 %
EOS (ABSOLUTE): 0.2 10*3/uL (ref 0.0–0.4)
Eos: 3 %
Hematocrit: 41.5 % (ref 34.0–46.6)
Hemoglobin: 14 g/dL (ref 11.1–15.9)
Immature Grans (Abs): 0 10*3/uL (ref 0.0–0.1)
Immature Granulocytes: 1 %
Lymphocytes Absolute: 1.7 10*3/uL (ref 0.7–3.1)
Lymphs: 27 %
MCH: 30.2 pg (ref 26.6–33.0)
MCHC: 33.7 g/dL (ref 31.5–35.7)
MCV: 89 fL (ref 79–97)
Monocytes Absolute: 0.5 10*3/uL (ref 0.1–0.9)
Monocytes: 8 %
Neutrophils Absolute: 3.6 10*3/uL (ref 1.4–7.0)
Neutrophils: 60 %
Platelets: 137 10*3/uL — ABNORMAL LOW (ref 150–450)
RBC: 4.64 x10E6/uL (ref 3.77–5.28)
RDW: 13.1 % (ref 11.7–15.4)
WBC: 6.1 10*3/uL (ref 3.4–10.8)

## 2023-04-02 LAB — CMP14+EGFR
ALT: 18 IU/L (ref 0–32)
AST: 18 IU/L (ref 0–40)
Albumin/Globulin Ratio: 1.4 (ref 1.2–2.2)
Albumin: 4 g/dL (ref 3.9–4.9)
Alkaline Phosphatase: 91 IU/L (ref 44–121)
BUN/Creatinine Ratio: 13 (ref 9–23)
BUN: 13 mg/dL (ref 6–24)
Bilirubin Total: 0.3 mg/dL (ref 0.0–1.2)
CO2: 23 mmol/L (ref 20–29)
Calcium: 9.8 mg/dL (ref 8.7–10.2)
Chloride: 104 mmol/L (ref 96–106)
Creatinine, Ser: 1.03 mg/dL — ABNORMAL HIGH (ref 0.57–1.00)
Globulin, Total: 2.8 g/dL (ref 1.5–4.5)
Glucose: 99 mg/dL (ref 70–99)
Potassium: 4.8 mmol/L (ref 3.5–5.2)
Sodium: 144 mmol/L (ref 134–144)
Total Protein: 6.8 g/dL (ref 6.0–8.5)
eGFR: 67 mL/min/{1.73_m2} (ref >59)

## 2023-04-18 ENCOUNTER — Other Ambulatory Visit: Payer: Self-pay | Admitting: Podiatry

## 2023-04-18 ENCOUNTER — Encounter: Payer: Self-pay | Admitting: Podiatry

## 2023-04-18 ENCOUNTER — Ambulatory Visit (INDEPENDENT_AMBULATORY_CARE_PROVIDER_SITE_OTHER): Payer: Medicare Other

## 2023-04-18 ENCOUNTER — Ambulatory Visit (INDEPENDENT_AMBULATORY_CARE_PROVIDER_SITE_OTHER): Payer: Medicare Other | Admitting: Podiatry

## 2023-04-18 DIAGNOSIS — M7751 Other enthesopathy of right foot: Secondary | ICD-10-CM

## 2023-04-18 DIAGNOSIS — M722 Plantar fascial fibromatosis: Secondary | ICD-10-CM

## 2023-04-18 DIAGNOSIS — M778 Other enthesopathies, not elsewhere classified: Secondary | ICD-10-CM

## 2023-04-18 MED ORDER — MELOXICAM 7.5 MG PO TABS: 7.5000 mg | ORAL_TABLET | Freq: Every day | ORAL | 0 refills | Status: DC | PRN

## 2023-04-18 NOTE — Progress Notes (Signed)
Subjective:   Patient ID: Paula Butler, female   DOB: 52 y.o.   MRN: [id number]   HPI Chief Complaint  Patient presents with   Foot Pain    Plantar heel bilateral - aching x 1 month, AM pain, tried Ibuprofen and soaking in salt water, icing, also has burning sensations intermittently lateral right ankle   Diabetes    Last A1c was "high" 2 weeks ago   New Patient (Initial Visit)    Both heels, burning ot the lateral right foot. Pain is intermittent. Sometimes it hurts to stand, pain in the morning. No treamtent. Takes ibuprofen and soak the feet and ice helps sometimes. She does not want shots    Review of Systems  All other systems reviewed and are negative.  Past Medical History:  Diagnosis Date   Diabetes mellitus without complication (HCC)    Hyperlipidemia    Hypertension     Past Surgical History:  Procedure Laterality Date   CHOLECYSTECTOMY     ESOPHAGOGASTRODUODENOSCOPY  06/14/2009   Dr. Otho Najjar; mild esophagitis biopsied, small gastric ulcer-healing s/p biopsied, gastritis biopsied, mild duodenitis biopsied.  Esophageal biopsy unremarkable, gastric and duodenal biopsy with no significant histopathologic features.   EXCISION OF BREAST BIOPSY Left 05/29/2018   fibrocystic changes retroareolar excisional bx   TUBAL LIGATION       Current Outpatient Medications:    meloxicam (MOBIC) 7.5 MG tablet, Take 1 tablet (7.5 mg total) by mouth daily as needed for pain., Disp: 30 tablet, Rfl: 0   atorvastatin (LIPITOR) 20 MG tablet, Take 1 tablet (20 mg total) by mouth daily., Disp: 90 tablet, Rfl: 3   Dulaglutide (TRULICITY) 1.5 MG/0.5ML SOPN, INJECT 1.5 MG UNDER THE SKIN WEEKLY, Disp: 2 mL, Rfl: 1   lisinopril (ZESTRIL) 20 MG tablet, Take 1 tablet (20 mg total) by mouth daily., Disp: 90 tablet, Rfl: 3   metFORMIN (GLUCOPHAGE XR) 500 MG 24 hr tablet, Take 1 tablet (500 mg total) by mouth daily with breakfast., Disp: 90 tablet, Rfl: 1   methocarbamol (ROBAXIN) 500 MG tablet,  Take 1 tablet (500 mg total) by mouth 2 (two) times daily., Disp: 10 tablet, Rfl: 0   naproxen (NAPROSYN) 500 MG tablet, Take 1 tablet (500 mg total) by mouth 2 (two) times daily., Disp: 10 tablet, Rfl: 0   sertraline (ZOLOFT) 50 MG tablet, Take 1 tablet (50 mg total) by mouth daily., Disp: 30 tablet, Rfl: 3  Allergies  Allergen Reactions   Fish Allergy Anaphylaxis, Shortness Of Breath and Rash   Shrimp [Shellfish Allergy] Anaphylaxis, Shortness Of Breath and Rash   Asa [Aspirin] Other (See Comments)    Abdominal swelling    Penicillins Hives, Itching and Rash    Has patient had a PCN reaction causing immediate rash, facial/tongue/throat swelling, SOB or lightheadedness with hypotension: Yes Has patient had a PCN reaction causing severe rash involving mucus membranes or skin necrosis: No Has patient had a PCN reaction that required hospitalization: Yes Has patient had a PCN reaction occurring within the last 10 years: Yes If all of the above answers are "NO", then may proceed with Cephalosporin use.           Objective:  Physical Exam  General: AAO x3, NAD  Dermatological: Skin is warm, dry and supple bilateral.  There are no open sores, no preulcerative lesions, no rash or signs of infection present.  Vascular: Dorsalis Pedis artery and Posterior Tibial artery pedal pulses are 2/4 bilateral with immedate capillary fill time.  There is no pain with calf compression, swelling, warmth, erythema.   Neruologic: Grossly intact via light touch bilateral. Negative tinel sign.  Musculoskeletal: Tenderness to palpation along the plantar medial tubercle of the calcaneus at the insertion of plantar fascia on the left and right foot. There is no pain along the course of the plantar fascia within the arch of the foot. Plantar fascia appears to be intact. There is no pain with lateral compression of the calcaneus or pain with vibratory sensation. There is no pain along the course or insertion of  the achilles tendon.  Mild discomfort including itself but there is no pain with ankle joint range of motion.  Muscular strength 5/5 in all groups tested bilateral.  Gait: Unassisted, Nonantalgic.       Assessment:  Bilateral heel pain, Plantar fasciitis, capsulitis     Plan:  -Treatment options discussed including all alternatives, risks, and complications -Etiology of symptoms were discussed -X-rays were obtained and reviewed with the patient.  3 views of the feet were obtained.  Decreased calcaneal inclination well.  Inferior calcaneal spurs are present.  Increased first metatarsal angle. -Prescribed mobic. Discussed side effects of the medication and directed to stop if any are to occur and call the office.  -Plantar fascia brace dispensed to help support the plantar fascia -Night splint dispensed. Static or dynamic ankle foot orthosis, including soft interface material, adjustable for fit, for positioning, may be used for minimal ambulation, prefabricated, off-the-shelf was dispensed on the billed date of service  -Stretching/icing daily -Discussed shoe modifications and good arch support.  Vivi Barrack DPM

## 2023-04-18 NOTE — Patient Instructions (Addendum)
If was nice to meet you today. If you have any questions or any further concerns, please feel fee to give me a call. You can call our office at [phone] or please feel fee to send me a message through MyChart.    For instructions on how to put on your Night Splint, please visit [email or web address]   Plantar Fasciitis (Heel Spur Syndrome) with Rehab The plantar fascia is a fibrous, ligament-like, soft-tissue structure that spans the bottom of the foot. Plantar fasciitis is a condition that causes pain in the foot due to inflammation of the tissue. SYMPTOMS   Pain and tenderness on the underneath side of the foot.  Pain that worsens with standing or walking. CAUSES  Plantar fasciitis is caused by irritation and injury to the plantar fascia on the underneath side of the foot. Common mechanisms of injury include:  Direct trauma to bottom of the foot.  Damage to a small nerve that runs under the foot where the main fascia attaches to the heel bone.  Stress placed on the plantar fascia due to bone spurs. RISK INCREASES WITH:   Activities that place stress on the plantar fascia (running, jumping, pivoting, or cutting).  Poor strength and flexibility.  Improperly fitted shoes.  Tight calf muscles.  Flat feet.  Failure to warm-up properly before activity.  Obesity. PREVENTION  Warm up and stretch properly before activity.  Allow for adequate recovery between workouts.  Maintain physical fitness:  Strength, flexibility, and endurance.  Cardiovascular fitness.  Maintain a health body weight.  Avoid stress on the plantar fascia.  Wear properly fitted shoes, including arch supports for individuals who have flat feet.  PROGNOSIS  If treated properly, then the symptoms of plantar fasciitis usually resolve without surgery. However, occasionally surgery is necessary.  RELATED COMPLICATIONS   Recurrent symptoms that may result in a chronic condition.  Problems of  the lower back that are caused by compensating for the injury, such as limping.  Pain or weakness of the foot during push-off following surgery.  Chronic inflammation, scarring, and partial or complete fascia tear, occurring more often from repeated injections.  TREATMENT  Treatment initially involves the use of ice and medication to help reduce pain and inflammation. The use of strengthening and stretching exercises may help reduce pain with activity, especially stretches of the Achilles tendon. These exercises may be performed at home or with a therapist. Your caregiver may recommend that you use heel cups of arch supports to help reduce stress on the plantar fascia. Occasionally, corticosteroid injections are given to reduce inflammation. If symptoms persist for greater than 6 months despite non-surgical (conservative), then surgery may be recommended.   MEDICATION   If pain medication is necessary, then nonsteroidal anti-inflammatory medications, such as aspirin and ibuprofen, or other minor pain relievers, such as acetaminophen, are often recommended.  Do not take pain medication within 7 days before surgery.  Prescription pain relievers may be given if deemed necessary by your caregiver. Use only as directed and only as much as you need.  Corticosteroid injections may be given by your caregiver. These injections should be reserved for the most serious cases, because they may only be given a certain number of times.  HEAT AND COLD  Cold treatment (icing) relieves pain and reduces inflammation. Cold treatment should be applied for 10 to 15 minutes every 2 to 3 hours for inflammation and pain and immediately after any activity that aggravates your symptoms. Use ice packs or massage the area with   a piece of ice (ice massage).  Heat treatment may be used prior to performing the stretching and strengthening activities prescribed by your caregiver, physical therapist, or athletic trainer. Use a  heat pack or soak the injury in warm water.  SEEK IMMEDIATE MEDICAL CARE IF:  Treatment seems to offer no benefit, or the condition worsens.  Any medications produce adverse side effects.  EXERCISES- RANGE OF MOTION (ROM) AND STRETCHING EXERCISES - Plantar Fasciitis (Heel Spur Syndrome) These exercises may help you when beginning to rehabilitate your injury. Your symptoms may resolve with or without further involvement from your physician, physical therapist or athletic trainer. While completing these exercises, remember:   Restoring tissue flexibility helps normal motion to return to the joints. This allows healthier, less painful movement and activity.  An effective stretch should be held for at least 30 seconds.  A stretch should never be painful. You should only feel a gentle lengthening or release in the stretched tissue.  RANGE OF MOTION - Toe Extension, Flexion  Sit with your right / left leg crossed over your opposite knee.  Grasp your toes and gently pull them back toward the top of your foot. You should feel a stretch on the bottom of your toes and/or foot.  Hold this stretch for 10 seconds.  Now, gently pull your toes toward the bottom of your foot. You should feel a stretch on the top of your toes and or foot.  Hold this stretch for 10 seconds. Repeat  times. Complete this stretch 3 times per day.   RANGE OF MOTION - Ankle Dorsiflexion, Active Assisted  Remove shoes and sit on a chair that is preferably not on a carpeted surface.  Place right / left foot under knee. Extend your opposite leg for support.  Keeping your heel down, slide your right / left foot back toward the chair until you feel a stretch at your ankle or calf. If you do not feel a stretch, slide your bottom forward to the edge of the chair, while still keeping your heel down.  Hold this stretch for 10 seconds. Repeat 3 times. Complete this stretch 2 times per day.   STRETCH  Gastroc,  Standing  Place hands on wall.  Extend right / left leg, keeping the front knee somewhat bent.  Slightly point your toes inward on your back foot.  Keeping your right / left heel on the floor and your knee straight, shift your weight toward the wall, not allowing your back to arch.  You should feel a gentle stretch in the right / left calf. Hold this position for 10 seconds. Repeat 3 times. Complete this stretch 2 times per day.  STRETCH  Soleus, Standing  Place hands on wall.  Extend right / left leg, keeping the other knee somewhat bent.  Slightly point your toes inward on your back foot.  Keep your right / left heel on the floor, bend your back knee, and slightly shift your weight over the back leg so that you feel a gentle stretch deep in your back calf.  Hold this position for 10 seconds. Repeat 3 times. Complete this stretch 2 times per day.  STRETCH  Gastrocsoleus, Standing  Note: This exercise can place a lot of stress on your foot and ankle. Please complete this exercise only if specifically instructed by your caregiver.   Place the ball of your right / left foot on a step, keeping your other foot firmly on the same step.  Hold on   to the wall or a rail for balance.  Slowly lift your other foot, allowing your body weight to press your heel down over the edge of the step.  You should feel a stretch in your right / left calf.  Hold this position for 10 seconds.  Repeat this exercise with a slight bend in your right / left knee. Repeat 3 times. Complete this stretch 2 times per day.   STRENGTHENING EXERCISES - Plantar Fasciitis (Heel Spur Syndrome)  These exercises may help you when beginning to rehabilitate your injury. They may resolve your symptoms with or without further involvement from your physician, physical therapist or athletic trainer. While completing these exercises, remember:   Muscles can gain both the endurance and the strength needed for everyday  activities through controlled exercises.  Complete these exercises as instructed by your physician, physical therapist or athletic trainer. Progress the resistance and repetitions only as guided.  STRENGTH - Towel Curls  Sit in a chair positioned on a non-carpeted surface.  Place your foot on a towel, keeping your heel on the floor.  Pull the towel toward your heel by only curling your toes. Keep your heel on the floor. Repeat 3 times. Complete this exercise 2 times per day.  STRENGTH - Ankle Inversion  Secure one end of a rubber exercise band/tubing to a fixed object (table, pole). Loop the other end around your foot just before your toes.  Place your fists between your knees. This will focus your strengthening at your ankle.  Slowly, pull your big toe up and in, making sure the band/tubing is positioned to resist the entire motion.  Hold this position for 10 seconds.  Have your muscles resist the band/tubing as it slowly pulls your foot back to the starting position. Repeat 3 times. Complete this exercises 2 times per day.  Document Released: 12/02/2005 Document Revised: 02/24/2012 Document Reviewed: 03/16/2009 Exit[employer] Patient Information 2014 [employer].  

## 2023-04-19 ENCOUNTER — Other Ambulatory Visit: Payer: Self-pay | Admitting: Family Medicine

## 2023-04-19 DIAGNOSIS — E1169 Type 2 diabetes mellitus with other specified complication: Secondary | ICD-10-CM

## 2023-04-23 ENCOUNTER — Ambulatory Visit (HOSPITAL_COMMUNITY)
Admission: RE | Admit: 2023-04-23 | Discharge: 2023-04-23 | Disposition: A | Discharge status: Home / Self Care | Payer: Medicare Other | Source: Ambulatory Visit | Attending: Family Medicine | Admitting: Family Medicine

## 2023-04-23 ENCOUNTER — Encounter (HOSPITAL_COMMUNITY): Payer: Self-pay

## 2023-04-23 DIAGNOSIS — R921 Mammographic calcification found on diagnostic imaging of breast: Secondary | ICD-10-CM | POA: Diagnosis not present

## 2023-04-23 DIAGNOSIS — R92323 Mammographic fibroglandular density, bilateral breasts: Secondary | ICD-10-CM | POA: Diagnosis not present

## 2023-05-13 ENCOUNTER — Encounter: Payer: Self-pay | Admitting: Family Medicine

## 2023-05-13 ENCOUNTER — Ambulatory Visit (INDEPENDENT_AMBULATORY_CARE_PROVIDER_SITE_OTHER): Payer: Medicare Other | Admitting: Family Medicine

## 2023-05-13 VITALS — BP 115/79 | HR 75 | Temp 97.9°F | Ht 65.0 in | Wt 268.6 lb

## 2023-05-13 DIAGNOSIS — E1169 Type 2 diabetes mellitus with other specified complication: Secondary | ICD-10-CM

## 2023-05-13 DIAGNOSIS — E1159 Type 2 diabetes mellitus with other circulatory complications: Secondary | ICD-10-CM

## 2023-05-13 DIAGNOSIS — I152 Hypertension secondary to endocrine disorders: Secondary | ICD-10-CM | POA: Diagnosis not present

## 2023-05-13 DIAGNOSIS — J309 Allergic rhinitis, unspecified: Secondary | ICD-10-CM | POA: Diagnosis not present

## 2023-05-13 DIAGNOSIS — E785 Hyperlipidemia, unspecified: Secondary | ICD-10-CM

## 2023-05-13 DIAGNOSIS — Z7984 Long term (current) use of oral hypoglycemic drugs: Secondary | ICD-10-CM | POA: Diagnosis not present

## 2023-05-13 DIAGNOSIS — L821 Other seborrheic keratosis: Secondary | ICD-10-CM

## 2023-05-13 NOTE — Progress Notes (Signed)
Subjective:  Patient ID: Paula Butler, female    DOB: 1971-11-22, 52 y.o.   MRN: [id number]  Patient Care Team: Sonny Masters, FNP as PCP - General (Family Medicine) Lanelle Bal, DO as Consulting Physician (Gastroenterology)   Chief Complaint:  Hypertension (4 week follow up  )   HPI: Paula Butler is a 52 y.o. female presenting on 05/13/2023 for Hypertension (4 week follow up  )   HTN improved this visit 115/79. Pt reports daily medication compliance and exerecise. Also note 5 lb weight loss sinc last visit.  Hypertension This is a chronic problem. The current episode started more than 1 year ago. The problem has been gradually improving since onset. The problem is controlled. The current treatment provides moderate improvement. There are no compliance problems.     Skin Lesion "Mole" on back that is irritated by clothing and under garments. Pt also expresses concern for "sunspots" on back, face and shoulders  Left thigh pain sharp 8/10 worst at  night. Hurts to rollover in bed. Better during day time. No apparent distress at this time minimal TTP with exam and TTP. Unaffected by exercise. Takes Mobic 7.5mg  PO daily and methocarbamol 500mg  PO BID for plantar fascitis   Environmental allergies OTC medication and nasal sprays are not working. Used to take allergy shots when used to live in [place] years ago. Ask for referral to allergist.  Pt inquiring after  gastic bypass referral status from previous visit. States she hasn't hear anything back.     Relevant past medical, surgical, family, and social history reviewed and updated as indicated.  Allergies and medications reviewed and updated. Data reviewed: Chart in Epic.   Past Medical History:  Diagnosis Date   Diabetes mellitus without complication (HCC)    Hyperlipidemia    Hypertension     Past Surgical History:  Procedure Laterality Date   CHOLECYSTECTOMY     ESOPHAGOGASTRODUODENOSCOPY  06/14/2009    Dr. Otho Najjar; mild esophagitis biopsied, small gastric ulcer-healing s/p biopsied, gastritis biopsied, mild duodenitis biopsied.  Esophageal biopsy unremarkable, gastric and duodenal biopsy with no significant histopathologic features.   EXCISION OF BREAST BIOPSY Left 05/29/2018   fibrocystic changes retroareolar excisional bx   TUBAL LIGATION      Social History   Socioeconomic History   Marital status: Married    Spouse name: Jose   Number of children: 3   Years of education: Not on file   Highest education level: Not on file  Occupational History   Occupation: disability  Tobacco Use   Smoking status: Never   Smokeless tobacco: Never  Vaping Use   Vaping Use: Never used  Substance and Sexual Activity   Alcohol use: No   Drug use: No   Sexual activity: Not Currently    Partners: Male    Birth control/protection: Surgical    Comment: tubal  Other Topics Concern   Not on file  Social History Narrative   Married for 10 years, 3 children, all grown/teenagers.    2 daughters, 1 son - children live with her   Attends church.    Walks for exercise.   Enjoys singing.   Eats all food groups.   Wears seatbelt.   Born in [place], grew up in [place]. Father in [employer]. Grandmother raised her.    Moved from [place] in 2018.    Does not work out side of home.    Social Determinants of Health   Financial Resource Strain:  Low Risk  (06/12/2022)   Overall Financial Resource Strain (CARDIA)    Difficulty of Paying Living Expenses: Not hard at all  Food Insecurity: No Food Insecurity (06/12/2022)   Hunger Vital Sign    Worried About Running Out of Food in the Last Year: Never true    Ran Out of Food in the Last Year: Never true  Transportation Needs: No Transportation Needs (06/12/2022)   PRAPARE - Administrator, Civil Service (Medical): No    Lack of Transportation (Non-Medical): No  Physical Activity: Inactive (06/12/2022)   Exercise Vital Sign    Days of Exercise  per Week: 0 days    Minutes of Exercise per Session: 0 min  Stress: No Stress Concern Present (06/12/2022)   [employer] of Occupational Health - Occupational Stress Questionnaire    Feeling of Stress : Not at all  Social Connections: Moderately Isolated (06/12/2022)   Social Connection and Isolation Panel [NHANES]    Frequency of Communication with Friends and Family: More than three times a week    Frequency of Social Gatherings with Friends and Family: More than three times a week    Attends Religious Services: Never    Database administrator or Organizations: No    Attends Banker Meetings: Never    Marital Status: Married  Catering manager Violence: Not At Risk (06/12/2022)   Humiliation, Afraid, Rape, and Kick questionnaire    Fear of Current or Ex-Partner: No    Emotionally Abused: No    Physically Abused: No    Sexually Abused: No    Outpatient Encounter Medications as of 05/13/2023  Medication Sig   atorvastatin (LIPITOR) 20 MG tablet Take 1 tablet (20 mg total) by mouth daily.   Dulaglutide (TRULICITY) 1.5 MG/0.5ML SOPN INJECT 1.5 MG UNDER THE SKIN WEEKLY   lisinopril (ZESTRIL) 20 MG tablet Take 1 tablet (20 mg total) by mouth daily.   meloxicam (MOBIC) 7.5 MG tablet Take 1 tablet (7.5 mg total) by mouth daily as needed for pain.   metFORMIN (GLUCOPHAGE XR) 500 MG 24 hr tablet Take 1 tablet (500 mg total) by mouth daily with breakfast.   methocarbamol (ROBAXIN) 500 MG tablet Take 1 tablet (500 mg total) by mouth 2 (two) times daily.   naproxen (NAPROSYN) 500 MG tablet Take 1 tablet (500 mg total) by mouth 2 (two) times daily.   sertraline (ZOLOFT) 50 MG tablet Take 1 tablet (50 mg total) by mouth daily.   No facility-administered encounter medications on file as of 05/13/2023.    Allergies  Allergen Reactions   Fish Allergy Anaphylaxis, Shortness Of Breath and Rash   Shrimp [Shellfish Allergy] Anaphylaxis, Shortness Of Breath and Rash   Asa [Aspirin]  Other (See Comments)    Abdominal swelling    Penicillins Hives, Itching and Rash    Has patient had a PCN reaction causing immediate rash, facial/tongue/throat swelling, SOB or lightheadedness with hypotension: Yes Has patient had a PCN reaction causing severe rash involving mucus membranes or skin necrosis: No Has patient had a PCN reaction that required hospitalization: Yes Has patient had a PCN reaction occurring within the last 10 years: Yes If all of the above answers are "NO", then may proceed with Cephalosporin use.     Review of Systems  Constitutional: Negative.  Negative for activity change, appetite change, chills, diaphoresis, fatigue, fever and unexpected weight change.  HENT:  Positive for rhinorrhea, sinus pressure, sinus pain and sneezing.  Pt states I have bad allergies. Previously treated eviromental allergies with "allergy shots" when living in [place]  Eyes: Negative.  Negative for photophobia and visual disturbance.  Respiratory: Negative.         Lungs clear to auscultation  Cardiovascular: Negative.   Gastrointestinal: Negative.   Endocrine: Negative.   Genitourinary: Negative.   Musculoskeletal:  Positive for myalgias (sharp pain from Left illiac crest to mid thigh). Negative for back pain, gait problem and joint swelling.  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 Diffuse nevi over back, shoulders and cheeks.).  Allergic/Immunologic: Positive for environmental allergies. Negative for food allergies and immunocompromised state.  Neurological: Negative.   Hematological: Negative.   Psychiatric/Behavioral: Negative.    All other systems reviewed and are negative.       Objective:  BP 115/79   Pulse 75   Temp 97.9 F (36.6 C) (Temporal)   Ht 5\' 5"  (1.651 m)   Wt 268 lb 9.6 oz (121.8 kg)   SpO2 94%   BMI 44.70 kg/m    Wt Readings from Last 3 Encounters:  05/13/23 268 lb 9.6  oz (121.8 kg)  04/01/23 273 lb 3.2 oz (123.9 kg)  01/29/23 273 lb 12.8 oz (124.2 kg)    Physical Exam Vitals and nursing note reviewed.  Constitutional:      Appearance: She is obese.  HENT:     Head: Normocephalic and atraumatic.     Nose: Nose normal.     Mouth/Throat:     Mouth: Mucous membranes are dry.     Pharynx: Oropharynx is clear.  Eyes:     Conjunctiva/sclera: Conjunctivae normal.     Pupils: Pupils are equal, round, and reactive to light.  Cardiovascular:     Rate and Rhythm: Normal rate and regular rhythm.     Pulses: Normal pulses.     Heart sounds: Normal heart sounds.  Pulmonary:     Effort: Pulmonary effort is normal.     Breath sounds: Normal breath sounds.  Abdominal:     General: Abdomen is flat. Bowel sounds are normal.  Musculoskeletal:        General: Normal range of motion.     Cervical back: Normal range of motion and neck supple.  Skin:    General: Skin is warm.     Capillary Refill: Capillary refill takes less than 2 seconds.     Findings: Lesion (mole and skin tags on back) present.  Neurological:     General: No focal deficit present.     Mental Status: She is alert.  Psychiatric:        Mood and Affect: Mood normal.        Behavior: Behavior normal.        Thought Content: Thought content normal.        Judgment: Judgment normal.     Results for orders placed or performed in visit on 04/01/23  Lipid panel  Result Value Ref Range   Cholesterol, Total 217 (H) 100 - 199 mg/dL   Triglycerides 829 (H) 0 - 149 mg/dL   HDL 40 >56 mg/dL   VLDL Cholesterol Cal 41 (H) 5 - 40 mg/dL   LDL Chol Calc (NIH) 213 (H) 0 - 99 mg/dL   Chol/HDL Ratio 5.4 (H) 0.0 - 4.4 ratio  CBC with Differential/Platelet  Result Value Ref Range   WBC 6.1 3.4 - 10.8 x10E3/uL   RBC 4.64  3.77 - 5.28 x10E6/uL   Hemoglobin 14.0 11.1 - 15.9 g/dL   Hematocrit 30.8 65.7 - 46.6 %   MCV 89 79 - 97 fL   MCH 30.2 26.6 - 33.0 pg   MCHC 33.7 31.5 - 35.7 g/dL   RDW 84.6 96.2  - 95.2 %   Platelets 137 (L) 150 - 450 x10E3/uL   Neutrophils 60 Not Estab. %   Lymphs 27 Not Estab. %   Monocytes 8 Not Estab. %   Eos 3 Not Estab. %   Basos 1 Not Estab. %   Neutrophils Absolute 3.6 1.4 - 7.0 x10E3/uL   Lymphocytes Absolute 1.7 0.7 - 3.1 x10E3/uL   Monocytes Absolute 0.5 0.1 - 0.9 x10E3/uL   EOS (ABSOLUTE) 0.2 0.0 - 0.4 x10E3/uL   Basophils Absolute 0.1 0.0 - 0.2 x10E3/uL   Immature Granulocytes 1 Not Estab. %   Immature Grans (Abs) 0.0 0.0 - 0.1 x10E3/uL  CMP14+EGFR  Result Value Ref Range   Glucose 99 70 - 99 mg/dL   BUN 13 6 - 24 mg/dL   Creatinine, Ser 8.41 (H) 0.57 - 1.00 mg/dL   eGFR 67 >32 GM/WNU/2.72   BUN/Creatinine Ratio 13 9 - 23   Sodium 144 134 - 144 mmol/L   Potassium 4.8 3.5 - 5.2 mmol/L   Chloride 104 96 - 106 mmol/L   CO2 23 20 - 29 mmol/L   Calcium 9.8 8.7 - 10.2 mg/dL   Total Protein 6.8 6.0 - 8.5 g/dL   Albumin 4.0 3.9 - 4.9 g/dL   Globulin, Total 2.8 1.5 - 4.5 g/dL   Albumin/Globulin Ratio 1.4 1.2 - 2.2   Bilirubin Total 0.3 0.0 - 1.2 mg/dL   Alkaline Phosphatase 91 44 - 121 IU/L   AST 18 0 - 40 IU/L   ALT 18 0 - 32 IU/L  Bayer DCA Hb A1c Waived  Result Value Ref Range   HB A1C (BAYER DCA - WAIVED) 5.7 (H) 4.8 - 5.6 %     Skin excision  Date/Time: 05/13/2023 1:45 PM  Performed by: Sonny Masters, FNP Authorized by: Sonny Masters, FNP   Number of Lesions: 1 Lesion 1:    Body area: trunk   Trunk location: back   Initial size (mm): 3   Malignancy: malignancy unknown     Destruction method: cryotherapy     Destruction method comment: three freeze thaw cycles    Pertinent labs & imaging results that were available during my care of the patient were reviewed by me and considered in my medical decision making.  Assessment & Plan:  HTN:  BP well controlled. Medication regimen of lisininopril 20mg  PO daily . Goal BP is 130/80. Pt aware to report any persistent high or low readings. DASH diet and exercise encouraged. Exercise at  least 150 minutes per week and increase as tolerated. Goal BMI > 25. Stress management encouraged. Avoid nicotine and tobacco product use. Avoid excessive alcohol and NSAID's. Avoid more than 2000 mg of sodium daily. Medications as prescribed. Follow up as scheduled.    Lab Results  Component Value Date   HGBA1C 5.7 (H) 04/01/2023   HGBA1C 6.3 (H) 12/27/2022   HGBA1C 5.9 (H) 06/04/2022    Diabetes Control:  Instruction/counseling given: reminded to bring blood glucose meter & log to each visit   1.  Rx changes: none 2.  Education: Reviewed 'ABCs' of diabetes management (respective goals in parentheses):  A1C (<7), blood pressure (<130/80), BMI (<25), and cholesterol (LDL <100). 3.  Discussed pathophysiology of DM; difference between type 1 and type 2 DM. 4.  CHO counting diet discussed.  Reviewed CHO amount in various foods and how to read nutrition labels.  Discussed recommended serving sizes.  5.  Recommend check BG 3 times a week and record readings to bring to next appointment. Report persistent high or low readings. 6.  Recommended increase physical activity - goal is 150 minutes per week and advance as tolerated. 7.  Adequate sleep, at least 6-8 hours per night.  9.  Follow up: 3 month    Continue all other maintenance medications.  Skin Lesion: Cryotherapy to seborric Keratosis  abraided surface with tongue depressor then 3x 5-10 second treatments with 10 second pause. Cover by bandaid and Pt educated that site will blister and to not pop blister and keep covered   Follow up plan: Previous referral for gastric bypass surgery. Referral still active Pt given contact information. [facility], [address], [address] - [place] [address] [phone]  Referral to allergist for seasonal environmental allergies and evaluation for treatment.  Referral to dermatology for multiple nevi on upper back and shoulders and face for skin cancer screening.    Return to clinic if  left thigh pain worsens or doesn't resolve with robaxin and Mobic being used to concurrently treat plantar fascitis.  RTC in 3 months fo follow up on DM2 and HTN. Continue prescribed Medicaions as directed. Continue exercise as tolerated  to 150 min a week.   Continue healthy lifestyle choices, including diet (rich in fruits, vegetables, and lean proteins, and low in salt and simple carbohydrates) and exercise (at least 30 minutes of moderate physical activity daily).  Educational handout given for HTN  The above assessment and management plan was discussed with the patient. The patient verbalized understanding of and has agreed to the management plan. Patient is aware to call the clinic if they develop any new symptoms or if symptoms persist or worsen. Patient is aware when to return to the clinic for a follow-up visit. Patient educated on when it is appropriate to go to the emergency department.   Maryelizabeth Kaufmann AGNP student [facility] [phone]   I personally was present during the history, physical exam, and medical decision-making activities of this visit and have verified that the services and findings are accurately documented in the nurse practitioner student's note.  Kari Baars, FNP-C [facility] [address] [place], [place] [address] [phone]

## 2023-05-13 NOTE — Addendum Note (Signed)
Addended by: Sonny Masters on: 05/13/2023 04:19 PM   Modules accepted: Level of Service

## 2023-05-13 NOTE — Patient Instructions (Addendum)
[facility] [address] - [place] [address] [phone]

## 2023-05-14 LAB — BMP8+EGFR
BUN/Creatinine Ratio: 23 (ref 9–23)
BUN: 18 mg/dL (ref 6–24)
CO2: 20 mmol/L (ref 20–29)
Calcium: 9.1 mg/dL (ref 8.7–10.2)
Chloride: 107 mmol/L — ABNORMAL HIGH (ref 96–106)
Creatinine, Ser: 0.79 mg/dL (ref 0.57–1.00)
Glucose: 97 mg/dL (ref 70–99)
Potassium: 4.1 mmol/L (ref 3.5–5.2)
Sodium: 143 mmol/L (ref 134–144)
eGFR: 91 mL/min/{1.73_m2} (ref >59)

## 2023-05-28 ENCOUNTER — Ambulatory Visit: Payer: Medicare Other | Admitting: Obstetrics & Gynecology

## 2023-06-04 ENCOUNTER — Ambulatory Visit: Payer: Medicare Other | Admitting: Obstetrics & Gynecology

## 2023-06-07 DIAGNOSIS — M545 Low back pain, unspecified: Secondary | ICD-10-CM | POA: Diagnosis not present

## 2023-06-07 DIAGNOSIS — N201 Calculus of ureter: Secondary | ICD-10-CM | POA: Diagnosis not present

## 2023-06-07 DIAGNOSIS — Z7984 Long term (current) use of oral hypoglycemic drugs: Secondary | ICD-10-CM | POA: Diagnosis not present

## 2023-06-07 DIAGNOSIS — Z79899 Other long term (current) drug therapy: Secondary | ICD-10-CM | POA: Diagnosis not present

## 2023-06-07 DIAGNOSIS — E785 Hyperlipidemia, unspecified: Secondary | ICD-10-CM | POA: Diagnosis not present

## 2023-06-07 DIAGNOSIS — D259 Leiomyoma of uterus, unspecified: Secondary | ICD-10-CM | POA: Diagnosis not present

## 2023-06-07 DIAGNOSIS — N23 Unspecified renal colic: Secondary | ICD-10-CM | POA: Diagnosis not present

## 2023-06-07 DIAGNOSIS — E119 Type 2 diabetes mellitus without complications: Secondary | ICD-10-CM | POA: Diagnosis not present

## 2023-06-07 DIAGNOSIS — Z880 Allergy status to penicillin: Secondary | ICD-10-CM | POA: Diagnosis not present

## 2023-06-07 DIAGNOSIS — I10 Essential (primary) hypertension: Secondary | ICD-10-CM | POA: Diagnosis not present

## 2023-06-10 ENCOUNTER — Other Ambulatory Visit: Payer: Self-pay | Admitting: Family Medicine

## 2023-06-10 DIAGNOSIS — E1169 Type 2 diabetes mellitus with other specified complication: Secondary | ICD-10-CM

## 2023-06-17 ENCOUNTER — Encounter: Payer: Self-pay | Admitting: *Deleted

## 2023-06-24 ENCOUNTER — Other Ambulatory Visit: Payer: Self-pay | Admitting: Family Medicine

## 2023-06-24 DIAGNOSIS — E1169 Type 2 diabetes mellitus with other specified complication: Secondary | ICD-10-CM

## 2023-06-25 ENCOUNTER — Encounter: Payer: Self-pay | Admitting: Family Medicine

## 2023-06-25 DIAGNOSIS — E1169 Type 2 diabetes mellitus with other specified complication: Secondary | ICD-10-CM

## 2023-06-25 MED ORDER — TRULICITY 1.5 MG/0.5ML ~~LOC~~ SOAJ
SUBCUTANEOUS | 0 refills | Status: DC
Start: 2023-06-25 — End: 2023-11-18

## 2023-07-09 ENCOUNTER — Ambulatory Visit: Payer: Medicare Other | Admitting: Allergy & Immunology

## 2023-07-16 ENCOUNTER — Ambulatory Visit: Payer: Medicare Other | Admitting: Obstetrics & Gynecology

## 2023-07-25 ENCOUNTER — Inpatient Hospital Stay: Payer: Medicare Other

## 2023-07-29 ENCOUNTER — Ambulatory Visit (INDEPENDENT_AMBULATORY_CARE_PROVIDER_SITE_OTHER): Payer: Medicare Other | Admitting: Obstetrics & Gynecology

## 2023-07-29 ENCOUNTER — Other Ambulatory Visit (HOSPITAL_COMMUNITY)
Admission: RE | Admit: 2023-07-29 | Payer: Medicare Other | Source: Ambulatory Visit | Admitting: Obstetrics & Gynecology

## 2023-07-29 ENCOUNTER — Encounter: Payer: Self-pay | Admitting: Obstetrics & Gynecology

## 2023-07-29 VITALS — BP 133/80 | HR 80 | Ht 64.0 in | Wt 270.0 lb

## 2023-07-29 DIAGNOSIS — Z01419 Encounter for gynecological examination (general) (routine) without abnormal findings: Secondary | ICD-10-CM

## 2023-07-29 DIAGNOSIS — N3281 Overactive bladder: Secondary | ICD-10-CM | POA: Diagnosis not present

## 2023-07-29 DIAGNOSIS — Z1151 Encounter for screening for human papillomavirus (HPV): Secondary | ICD-10-CM | POA: Diagnosis not present

## 2023-07-29 DIAGNOSIS — N3946 Mixed incontinence: Secondary | ICD-10-CM | POA: Diagnosis not present

## 2023-07-29 DIAGNOSIS — N951 Menopausal and female climacteric states: Secondary | ICD-10-CM

## 2023-07-29 MED ORDER — VEOZAH 45 MG PO TABS
1.0000 | ORAL_TABLET | Freq: Every day | ORAL | 11 refills | Status: AC
Start: 2023-07-29 — End: 2023-08-28

## 2023-07-29 NOTE — Progress Notes (Signed)
WELL-WOMAN EXAMINATION Patient name: Paula Butler MRN [id number]  Date of birth: 1971/04/01 Chief Complaint:   Gynecologic Exam  History of Present Illness:   Paula Butler is a 52 y.o. [phone] PM female being seen today for a routine well-woman exam and the following concerns:   Hot flashes:  Issue for the past 6 mos- notes difficulty with both hot flashes and night sweats.  Reports difficulty with sleeping due to the night sweats.  Typically hot flash 3-4x per day.  Rates her symptoms 8/10  Urinary concerns: For the last 3mos.  Denies dysuria or hematuria.  Notes urge incontinence.  Notes considerable urgency.  +Nocturia- 3-4x +stress incontinence also noted  Notes sweet tea about 3-4x per week. No other caffeine  Patient has started Trulicity and has already noted significant weight gain.  She is working closely with her PCP regarding nutritional changes  No LMP recorded. (Menstrual status: Perimenopausal).   Last pap collected today.  Last mammogram: 04/2023. Last colonoscopy: In the process of scheduling     07/29/2023   11:31 AM 05/13/2023   11:44 AM 04/01/2023   10:51 AM 01/28/2023    9:02 AM 12/27/2022    4:11 PM  Depression screen PHQ 2/9  Decreased Interest 0 0 1 1 0  Down, Depressed, Hopeless 0 1 1 1 1   PHQ - 2 Score 0 1 2 2 1   Altered sleeping 1 1 1 2 2   Tired, decreased energy 1 1 1 1 2   Change in appetite 0 1 0 0 1  Feeling bad or failure about yourself  0 0 0 0 0  Trouble concentrating 0 0 0 1 0  Moving slowly or fidgety/restless 0 0 0 0 0  Suicidal thoughts 0 0 0 0 0  PHQ-9 Score 2 4 4 6 6   Difficult doing work/chores  Not difficult at all Not difficult at all Not difficult at all Not difficult at all      Review of Systems:   Pertinent items are noted in HPI Denies any headaches, blurred vision, fatigue, shortness of breath, chest pain, abdominal pain, bowel movements, urination, or intercourse unless otherwise stated above.  Pertinent  History Reviewed:  Reviewed past medical,surgical, social and family history.  Reviewed problem list, medications and allergies. Physical Assessment:   Vitals:   07/29/23 1126  BP: 133/80  Pulse: 80  Weight: 270 lb (122.5 kg)  Height: 5\' 4"  (1.626 m)  Body mass index is 46.35 kg/m.        Physical Examination:   General appearance - well appearing, and in no distress  Mental status - alert, oriented to person, place, and time  Psych:  She has a normal mood and affect  Skin - warm and dry, normal color, no suspicious lesions noted  Chest - effort normal, all lung fields clear to auscultation bilaterally  Heart - normal rate and regular rhythm  Neck:  midline trachea, no thyromegaly or nodules  Breasts - breasts appear normal, no suspicious masses, no skin or nipple changes or  axillary nodes  Abdomen - obese, soft, nontender, nondistended, no masses or organomegaly  Pelvic - VULVA: normal appearing vulva with no masses, tenderness or lesions  VAGINA: normal appearing vagina with normal color and discharge, no lesions  CERVIX: normal appearing cervix without discharge or lesions, no CMT  Thin prep pap is done with HR HPV cotesting  UTERUS: uterus is felt to be normal size, shape, consistency and nontender   ADNEXA: No  adnexal masses or tenderness noted.  Extremities:  No swelling or varicosities noted  Chaperone:  Dr. Wyn Forster      Assessment & Plan:  1) Well-Woman Exam -Pap collected, reviewed ASCCP guidelines -Other preventive screenings up-to-date  2) Vasomotor symptoms -Discussed conservative versus medical management options -Discussed HRT versus Veozah Patient given samples of Veozah, Rx sent in -LFTs reviewed 05-2023 within normal limits []  Patient aware that LFTs will need to be followed every 3 months  3) OAB, Mixed incontinence -Advised patient to cut back on sweet tea -Encouraged patient to continue on weight loss journey as this will also influence her  symptoms []  Next visit discussed medical management options -I would prefer not to start multiple medications at the same time should she note side effect next   Meds:  Meds ordered this encounter  Medications   Fezolinetant (VEOZAH) 45 MG TABS    Sig: Take 1 tablet (45 mg total) by mouth daily.    Dispense:  30 tablet    Refill:  11    Follow-up: Return in about 3 months (around 10/29/2023) for Medication follow up.   Myna Hidalgo, DO Attending Obstetrician & Gynecologist, [facility] for [employer], [facility]

## 2023-07-30 ENCOUNTER — Inpatient Hospital Stay: Payer: Medicare Other | Attending: Oncology

## 2023-07-30 DIAGNOSIS — D696 Thrombocytopenia, unspecified: Secondary | ICD-10-CM | POA: Insufficient documentation

## 2023-07-30 LAB — CBC
HCT: 41.4 % (ref 36.0–46.0)
Hemoglobin: 13.9 g/dL (ref 12.0–15.0)
MCH: 31.2 pg (ref 26.0–34.0)
MCHC: 33.6 g/dL (ref 30.0–36.0)
MCV: 92.8 fL (ref 80.0–100.0)
Platelets: 127 10*3/uL — ABNORMAL LOW (ref 150–400)
RBC: 4.46 MIL/uL (ref 3.87–5.11)
RDW: 13.9 % (ref 11.5–15.5)
WBC: 7.3 10*3/uL (ref 4.0–10.5)
nRBC: 0 % (ref 0.0–0.2)

## 2023-08-11 ENCOUNTER — Ambulatory Visit: Payer: Medicare Other | Admitting: Internal Medicine

## 2023-08-13 ENCOUNTER — Encounter: Payer: Self-pay | Admitting: Family Medicine

## 2023-08-13 ENCOUNTER — Ambulatory Visit (INDEPENDENT_AMBULATORY_CARE_PROVIDER_SITE_OTHER): Payer: Medicare Other | Admitting: Family Medicine

## 2023-08-13 ENCOUNTER — Encounter: Payer: Self-pay | Admitting: *Deleted

## 2023-08-13 VITALS — BP 135/90 | HR 83 | Temp 98.0°F | Ht 64.0 in | Wt 268.0 lb

## 2023-08-13 DIAGNOSIS — F4329 Adjustment disorder with other symptoms: Secondary | ICD-10-CM

## 2023-08-13 DIAGNOSIS — F321 Major depressive disorder, single episode, moderate: Secondary | ICD-10-CM | POA: Diagnosis not present

## 2023-08-13 DIAGNOSIS — F411 Generalized anxiety disorder: Secondary | ICD-10-CM | POA: Diagnosis not present

## 2023-08-13 DIAGNOSIS — I152 Hypertension secondary to endocrine disorders: Secondary | ICD-10-CM

## 2023-08-13 DIAGNOSIS — Z7984 Long term (current) use of oral hypoglycemic drugs: Secondary | ICD-10-CM

## 2023-08-13 DIAGNOSIS — E1159 Type 2 diabetes mellitus with other circulatory complications: Secondary | ICD-10-CM | POA: Diagnosis not present

## 2023-08-13 DIAGNOSIS — G5601 Carpal tunnel syndrome, right upper limb: Secondary | ICD-10-CM | POA: Diagnosis not present

## 2023-08-13 DIAGNOSIS — E785 Hyperlipidemia, unspecified: Secondary | ICD-10-CM

## 2023-08-13 DIAGNOSIS — E1169 Type 2 diabetes mellitus with other specified complication: Secondary | ICD-10-CM

## 2023-08-13 LAB — BAYER DCA HB A1C WAIVED: HB A1C (BAYER DCA - WAIVED): 5.3 % (ref 4.8–5.6)

## 2023-08-13 MED ORDER — SERTRALINE HCL 50 MG PO TABS
50.0000 mg | ORAL_TABLET | Freq: Every day | ORAL | 1 refills | Status: DC
Start: 2023-08-13 — End: 2023-11-18

## 2023-08-13 MED ORDER — PREDNISONE 20 MG PO TABS: 40.0000 mg | ORAL_TABLET | Freq: Every day | ORAL | 0 refills | Status: AC

## 2023-08-13 NOTE — Patient Instructions (Addendum)

## 2023-08-13 NOTE — Progress Notes (Signed)
Subjective:  Patient ID: Paula Butler, female    DOB: 1971-03-30, 52 y.o.   MRN: [id number]  Patient Care Team: Sonny Masters, FNP as PCP - General (Family Medicine) Lanelle Bal, DO as Consulting Physician (Gastroenterology)   Chief Complaint:  Diabetes (3 month follow up/) and Arm Pain (Right arm pain - states that it runs down her right arm x 3-4 days )   HPI: Paula Butler is a 52 y.o. female presenting on 08/13/2023 for Diabetes (3 month follow up/) and Arm Pain (Right arm pain - states that it runs down her right arm x 3-4 days )    1. Type 2 diabetes mellitus with other specified complication, without long-term current use of insulin (HCC) Has been taking Trulicity and metformin as prescribed. States she has not been checking her blood sugars. Denies polyuria, polyphagia, or polydipsia. She does try to follow a healthy diet, does not exercise on a regular basis.   2. Hypertension associated with diabetes (HCC) Complaint with meds - Yes Current Medications - lisinopril Checking BP at home - no Exercising Regularly - No Watching Salt intake - Yes Pertinent ROS:  Headache - No Fatigue - No Visual Disturbances - No Chest pain - No Dyspnea - No Palpitations - No LE edema - No They report good compliance with medications and can restate their regimen by memory. No medication side effects.  BP Readings from Last 3 Encounters:  08/13/23 (!) 135/90  07/29/23 133/80  05/13/23 115/79     3. Hyperlipidemia associated with type 2 diabetes mellitus (HCC) Compliant with medications - Yes Current medications - atorvastatin  Side effects from medications - No Diet - general, does not follow a strict diet.  Exercise - none  4. GAD (generalized anxiety disorder) 5. Depression, major, single episode, moderate (HCC) 6. Grief reaction with prolonged bereavement States she is taking her sertraline as prescribed and feels it is doing well. No SI or HI.      08/13/2023   11:23 AM 07/29/2023   11:31 AM 05/13/2023   11:44 AM 04/01/2023   10:51 AM  GAD 7 : Generalized Anxiety Score  Nervous, Anxious, on Edge 1 1 1 1   Control/stop worrying 1 0 1 1  Worry too much - different things 0 1 0 0  Trouble relaxing 0 1 0 1  Restless 0 0 0 0  Easily annoyed or irritable 2 1 2 1   Afraid - awful might happen 0 0 0 0  Total GAD 7 Score 4 4 4 4   Anxiety Difficulty Not difficult at all  Not difficult at all Not difficult at all       08/13/2023   11:22 AM 07/29/2023   11:31 AM 05/13/2023   11:44 AM 04/01/2023   10:51 AM 01/28/2023    9:02 AM  Depression screen PHQ 2/9  Decreased Interest 1 0 0 1 1  Down, Depressed, Hopeless 0 0 1 1 1   PHQ - 2 Score 1 0 1 2 2   Altered sleeping 1 1 1 1 2   Tired, decreased energy 1 1 1 1 1   Change in appetite 0 0 1 0 0  Feeling bad or failure about yourself  0 0 0 0 0  Trouble concentrating 0 0 0 0 1  Moving slowly or fidgety/restless 0 0 0 0 0  Suicidal thoughts 0 0 0 0 0  PHQ-9 Score 3 2 4 4 6   Difficult doing work/chores Not difficult at  all  Not difficult at all Not difficult at all Not difficult at all     7. Right wrist pain Ongoing right wrist pain over the last several weeks. States she has numbness and tingling in her first three fingers. Worse after waking up, states she thinks she sleeps on it wrong. She has not tried anything for the symptoms. No injuries.   8. Morbid obesity (HCC) She was referred to bariatric surgery for evaluation. Has not received an appointment.      Relevant past medical, surgical, family, and social history reviewed and updated as indicated.  Allergies and medications reviewed and updated. Data reviewed: Chart in Epic.   Past Medical History:  Diagnosis Date   Diabetes mellitus without complication (HCC)    Hyperlipidemia    Hypertension     Past Surgical History:  Procedure Laterality Date   CHOLECYSTECTOMY     ESOPHAGOGASTRODUODENOSCOPY  06/14/2009   Dr. Otho Najjar; mild  esophagitis biopsied, small gastric ulcer-healing s/p biopsied, gastritis biopsied, mild duodenitis biopsied.  Esophageal biopsy unremarkable, gastric and duodenal biopsy with no significant histopathologic features.   EXCISION OF BREAST BIOPSY Left 05/29/2018   fibrocystic changes retroareolar excisional bx   TUBAL LIGATION      Social History   Socioeconomic History   Marital status: Married    Spouse name: Jose   Number of children: 3   Years of education: Not on file   Highest education level: Not on file  Occupational History   Occupation: disability  Tobacco Use   Smoking status: Never   Smokeless tobacco: Never  Vaping Use   Vaping status: Never Used  Substance and Sexual Activity   Alcohol use: No   Drug use: No   Sexual activity: Not Currently    Partners: Male    Birth control/protection: Surgical    Comment: tubal  Other Topics Concern   Not on file  Social History Narrative   Married for 10 years, 3 children, all grown/teenagers.    2 daughters, 1 son - children live with her   Attends church.    Walks for exercise.   Enjoys singing.   Eats all food groups.   Wears seatbelt.   Born in [place], grew up in [place]. Father in [employer]. Grandmother raised her.    Moved from [place] in 2018.    Does not work out side of home.    Social Determinants of Health   Financial Resource Strain: Low Risk  (07/29/2023)   Overall Financial Resource Strain (CARDIA)    Difficulty of Paying Living Expenses: Not very hard  Food Insecurity: Food Insecurity Present (07/29/2023)   Hunger Vital Sign    Worried About Running Out of Food in the Last Year: Sometimes true    Ran Out of Food in the Last Year: Sometimes true  Transportation Needs: No Transportation Needs (07/29/2023)   PRAPARE - Administrator, Civil Service (Medical): No    Lack of Transportation (Non-Medical): No  Physical Activity: Insufficiently Active (07/29/2023)   Exercise Vital Sign    Days of  Exercise per Week: 2 days    Minutes of Exercise per Session: 40 min  Stress: No Stress Concern Present (07/29/2023)   [employer] of Occupational Health - Occupational Stress Questionnaire    Feeling of Stress : Only a little  Social Connections: Moderately Integrated (07/29/2023)   Social Connection and Isolation Panel [NHANES]    Frequency of Communication with Friends and Family: More than three times a  week    Frequency of Social Gatherings with Friends and Family: Twice a week    Attends Religious Services: 1 to 4 times per year    Active Member of [employer] or Organizations: No    Attends Banker Meetings: Never    Marital Status: Married  Catering manager Violence: Not At Risk (07/29/2023)   Humiliation, Afraid, Rape, and Kick questionnaire    Fear of Current or Ex-Partner: No    Emotionally Abused: No    Physically Abused: No    Sexually Abused: No    Outpatient Encounter Medications as of 08/13/2023  Medication Sig   atorvastatin (LIPITOR) 20 MG tablet Take 1 tablet (20 mg total) by mouth daily.   Dulaglutide (TRULICITY) 1.5 MG/0.5ML SOPN INJECT 1.5 MG UNDER THE SKIN WEEKLY   Fezolinetant (VEOZAH) 45 MG TABS Take 1 tablet (45 mg total) by mouth daily.   lisinopril (ZESTRIL) 20 MG tablet Take 1 tablet (20 mg total) by mouth daily.   predniSONE (DELTASONE) 20 MG tablet Take 2 tablets (40 mg total) by mouth daily with breakfast for 5 days. 2 po daily for 5 days   [DISCONTINUED] meloxicam (MOBIC) 7.5 MG tablet Take 1 tablet (7.5 mg total) by mouth daily as needed for pain.   [DISCONTINUED] metFORMIN (GLUCOPHAGE XR) 500 MG 24 hr tablet Take 1 tablet (500 mg total) by mouth daily with breakfast.   [DISCONTINUED] methocarbamol (ROBAXIN) 500 MG tablet Take 1 tablet (500 mg total) by mouth 2 (two) times daily.   [DISCONTINUED] naproxen (NAPROSYN) 500 MG tablet Take 1 tablet (500 mg total) by mouth 2 (two) times daily.   [DISCONTINUED] sertraline (ZOLOFT) 50 MG tablet Take  1 tablet (50 mg total) by mouth daily.   sertraline (ZOLOFT) 50 MG tablet Take 1 tablet (50 mg total) by mouth daily.   No facility-administered encounter medications on file as of 08/13/2023.    Allergies  Allergen Reactions   Fish Allergy Anaphylaxis, Shortness Of Breath and Rash   Shrimp [Shellfish Allergy] Anaphylaxis, Shortness Of Breath and Rash   Asa [Aspirin] Other (See Comments)    Abdominal swelling    Penicillins Hives, Itching and Rash    Has patient had a PCN reaction causing immediate rash, facial/tongue/throat swelling, SOB or lightheadedness with hypotension: Yes Has patient had a PCN reaction causing severe rash involving mucus membranes or skin necrosis: No Has patient had a PCN reaction that required hospitalization: Yes Has patient had a PCN reaction occurring within the last 10 years: Yes If all of the above answers are "NO", then may proceed with Cephalosporin use.     Review of Systems  Constitutional:  Positive for activity change. Negative for appetite change, chills, diaphoresis, fatigue, fever and unexpected weight change.  HENT: Negative.    Eyes: Negative.  Negative for photophobia and visual disturbance.  Respiratory:  Negative for cough, chest tightness and shortness of breath.   Cardiovascular:  Negative for chest pain, palpitations and leg swelling.  Gastrointestinal:  Negative for abdominal pain, blood in stool, constipation, diarrhea, nausea and vomiting.  Endocrine: Negative.  Negative for polydipsia, polyphagia and polyuria.  Genitourinary:  Negative for decreased urine volume, difficulty urinating, dysuria, frequency and urgency.  Musculoskeletal:  Positive for arthralgias and myalgias.  Skin: Negative.   Allergic/Immunologic: Negative.   Neurological:  Positive for numbness. Negative for dizziness, tremors, seizures, syncope, facial asymmetry, speech difficulty, weakness, light-headedness and headaches.  Hematological: Negative.    Psychiatric/Behavioral:  Positive for agitation and sleep disturbance. Negative  for confusion, hallucinations and suicidal ideas. The patient is nervous/anxious.   All other systems reviewed and are negative.       Objective:  BP (!) 135/90   Pulse 83   Temp 98 F (36.7 C) (Temporal)   Ht 5\' 4"  (1.626 m)   Wt 268 lb (121.6 kg)   SpO2 93%   BMI 46.00 kg/m    Wt Readings from Last 3 Encounters:  08/13/23 268 lb (121.6 kg)  07/29/23 270 lb (122.5 kg)  05/13/23 268 lb 9.6 oz (121.8 kg)    Physical Exam Vitals and nursing note reviewed.  Constitutional:      General: She is not in acute distress.    Appearance: Normal appearance. She is well-developed and well-groomed. She is morbidly obese. She is not ill-appearing, toxic-appearing or diaphoretic.  HENT:     Head: Normocephalic and atraumatic.     Jaw: There is normal jaw occlusion.     Right Ear: Hearing normal.     Left Ear: Hearing normal.     Nose: Nose normal.     Mouth/Throat:     Lips: Pink.     Mouth: Mucous membranes are moist.     Pharynx: Oropharynx is clear. Uvula midline.  Eyes:     General: Lids are normal.     Extraocular Movements: Extraocular movements intact.     Conjunctiva/sclera: Conjunctivae normal.     Pupils: Pupils are equal, round, and reactive to light.  Neck:     Thyroid: No thyroid mass, thyromegaly or thyroid tenderness.     Vascular: No carotid bruit or JVD.     Trachea: Trachea and phonation normal.  Cardiovascular:     Rate and Rhythm: Normal rate and regular rhythm.     Chest Wall: PMI is not displaced.     Pulses: Normal pulses.     Heart sounds: Normal heart sounds. No murmur heard.    No friction rub. No gallop.  Pulmonary:     Effort: Pulmonary effort is normal. No respiratory distress.     Breath sounds: Normal breath sounds. No wheezing.  Abdominal:     General: Bowel sounds are normal. There is no distension or abdominal bruit.     Palpations: Abdomen is soft. There is  no hepatomegaly or splenomegaly.     Tenderness: There is no abdominal tenderness. There is no right CVA tenderness or left CVA tenderness.     Hernia: No hernia is present.  Musculoskeletal:     Right forearm: Normal.     Right wrist: Tenderness present. No swelling, deformity, effusion, lacerations, bony tenderness, snuff box tenderness or crepitus. Decreased range of motion. Normal pulse.     Right hand: Normal.     Cervical back: Normal range of motion and neck supple.     Right lower leg: No edema.     Left lower leg: No edema.     Comments: Right wrist: positive Phalen sign  Lymphadenopathy:     Cervical: No cervical adenopathy.  Skin:    General: Skin is warm and dry.     Capillary Refill: Capillary refill takes less than 2 seconds.     Coloration: Skin is not cyanotic, jaundiced or pale.     Findings: No rash.  Neurological:     General: No focal deficit present.     Mental Status: She is alert and oriented to person, place, and time.     Sensory: Sensation is intact.     Motor: Motor  function is intact.     Coordination: Coordination is intact.     Gait: Gait is intact.     Deep Tendon Reflexes: Reflexes are normal and symmetric.  Psychiatric:        Attention and Perception: Attention and perception normal.        Mood and Affect: Mood and affect normal.        Speech: Speech normal.        Behavior: Behavior normal. Behavior is cooperative.        Thought Content: Thought content normal.        Cognition and Memory: Cognition and memory normal.        Judgment: Judgment normal.     Results for orders placed or performed in visit on 07/30/23  CBC  Result Value Ref Range   WBC 7.3 4.0 - 10.5 K/uL   RBC 4.46 3.87 - 5.11 MIL/uL   Hemoglobin 13.9 12.0 - 15.0 g/dL   HCT 14.7 82.9 - 56.2 %   MCV 92.8 80.0 - 100.0 fL   MCH 31.2 26.0 - 34.0 pg   MCHC 33.6 30.0 - 36.0 g/dL   RDW 13.0 86.5 - 78.4 %   Platelets 127 (L) 150 - 400 K/uL   nRBC 0.0 0.0 - 0.2 %        Pertinent labs & imaging results that were available during my care of the patient were reviewed by me and considered in my medical decision making.  Assessment & Plan:  Paula Butler was seen today for diabetes and arm pain.  Diagnoses and all orders for this visit:  Type 2 diabetes mellitus with other specified complication, without long-term current use of insulin (HCC) A1C 5.3, can stop metformin. Continue Trulicity. Diet and exercise encouraged. Other labs pending.  -     Bayer DCA Hb A1c Waived -     Vitamin B12 -     Microalbumin / creatinine urine ratio  Hypertension associated with diabetes (HCC) BP fairly controlled. Changes were not made in regimen today. Goal BP is 130/80. Pt aware to report any persistent high or low readings. DASH diet and exercise encouraged. Exercise at least 150 minutes per week and increase as tolerated. Goal BMI > 25. Stress management encouraged. Avoid nicotine and tobacco product use. Avoid excessive alcohol and NSAID's. Avoid more than 2000 mg of sodium daily. Medications as prescribed. Follow up as scheduled.   Hyperlipidemia associated with type 2 diabetes mellitus (HCC) Diet encouraged - increase intake of fresh fruits and vegetables, increase intake of lean proteins. Bake, broil, or grill foods. Avoid fried, greasy, and fatty foods. Avoid fast foods. Increase intake of fiber-rich whole grains. Exercise encouraged - at least 150 minutes per week and advance as tolerated.  Goal BMI < 25. Continue medications as prescribed. Follow up in 3-6 months as discussed.  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  -     sertraline (ZOLOFT) 50 MG tablet; Take 1 tablet (50 mg total) by mouth daily.  Right carpal tunnel syndrome Positive Phalen sign in office. Will burst with steroids. Brace use for the next 2 weeks, especially  at night. Report new, worsening, or persistent symptoms.  -     predniSONE (DELTASONE) 20 MG tablet; Take 2 tablets (40 mg total) by mouth daily with breakfast for 5 days. 2 po daily for 5 days  Morbid obesity (HCC) New referral placed. Diet and exercise encouraged.  -     Amb Referral to Bariatric Surgery     Continue all other maintenance medications.  Follow up plan: Return in about 3 months (around 11/13/2023) for DM.   Continue healthy lifestyle choices, including diet (rich in fruits, vegetables, and lean proteins, and low in salt and simple carbohydrates) and exercise (at least 30 minutes of moderate physical activity daily).  Educational handout given for DM  The above assessment and management plan was discussed with the patient. The patient verbalized understanding of and has agreed to the management plan. Patient is aware to call the clinic if they develop any new symptoms or if symptoms persist or worsen. Patient is aware when to return to the clinic for a follow-up visit. Patient educated on when it is appropriate to go to the emergency department.   Kari Baars, FNP-C [facility] [phone]

## 2023-08-14 LAB — VITAMIN B12: Vitamin B-12: 513 pg/mL (ref 232–1245)

## 2023-08-14 LAB — LIPID PANEL
Chol/HDL Ratio: 6.2 ratio — ABNORMAL HIGH (ref 0.0–4.4)
Cholesterol, Total: 228 mg/dL — ABNORMAL HIGH (ref 100–199)
HDL: 37 mg/dL — ABNORMAL LOW (ref >39)
LDL Chol Calc (NIH): 156 mg/dL — ABNORMAL HIGH (ref 0–99)
Triglycerides: 189 mg/dL — ABNORMAL HIGH (ref 0–149)
VLDL Cholesterol Cal: 35 mg/dL (ref 5–40)

## 2023-09-04 ENCOUNTER — Telehealth: Payer: Self-pay | Admitting: Family Medicine

## 2023-09-09 ENCOUNTER — Other Ambulatory Visit: Payer: Self-pay | Admitting: Family Medicine

## 2023-09-09 DIAGNOSIS — E1169 Type 2 diabetes mellitus with other specified complication: Secondary | ICD-10-CM

## 2023-10-13 ENCOUNTER — Ambulatory Visit: Payer: Medicare Other | Admitting: Obstetrics & Gynecology

## 2023-10-13 ENCOUNTER — Encounter: Payer: Self-pay | Admitting: Obstetrics & Gynecology

## 2023-10-13 VITALS — BP 134/80 | HR 78 | Ht 65.0 in | Wt 261.4 lb

## 2023-10-13 DIAGNOSIS — N951 Menopausal and female climacteric states: Secondary | ICD-10-CM

## 2023-10-13 DIAGNOSIS — Z79899 Other long term (current) drug therapy: Secondary | ICD-10-CM

## 2023-10-13 DIAGNOSIS — Z6841 Body Mass Index (BMI) 40.0 and over, adult: Secondary | ICD-10-CM | POA: Diagnosis not present

## 2023-10-13 DIAGNOSIS — N3281 Overactive bladder: Secondary | ICD-10-CM

## 2023-10-13 MED ORDER — GEMTESA 75 MG PO TABS
75.0000 mg | ORAL_TABLET | Freq: Every day | ORAL | 11 refills | Status: AC
Start: 2023-10-13 — End: 2023-11-12

## 2023-10-13 NOTE — Progress Notes (Signed)
   GYN VISIT Patient name: Paula Butler MRN [id number]  Date of birth: Feb 14, 1971 Chief Complaint:   Follow-up Allyne Gee follow up and "pills for incontinence.")  History of Present Illness:   Paula Butler is a 52 y.o. [phone] PM female being seen today for the following concerns:  Medication follow up-  Vasomotor symptoms: Last visit started on Veozah- Pt was not able to get veozah rx- only samples.  She does feel like the medicaiton was helpful.  Additionally, she is taking a menopausal tea, which has also helped.  Now her biggest issue is her bladder concerns.  Notes considerable mixed incontinence.  Pt up at least 3x per night due to void.  Notes mostly urgency and urge incontinence.    Since last visit, she has cut back on soda, sweet tea and started exercising- both weight lifting and walking.  She has had an almost 10lb weight loss.  No LMP recorded. (Menstrual status: Perimenopausal).    Review of Systems:   Pertinent items are noted in HPI Denies fever/chills, dizziness, headaches, visual disturbances, fatigue, shortness of breath, chest pain, abdominal pain, vomiting. Pertinent History Reviewed:   Past Surgical History:  Procedure Laterality Date   CHOLECYSTECTOMY     ESOPHAGOGASTRODUODENOSCOPY  06/14/2009   Dr. Otho Najjar; mild esophagitis biopsied, small gastric ulcer-healing s/p biopsied, gastritis biopsied, mild duodenitis biopsied.  Esophageal biopsy unremarkable, gastric and duodenal biopsy with no significant histopathologic features.   EXCISION OF BREAST BIOPSY Left 05/29/2018   fibrocystic changes retroareolar excisional bx   TUBAL LIGATION      Past Medical History:  Diagnosis Date   Diabetes mellitus without complication (HCC)    Hyperlipidemia    Hypertension    Reviewed problem list, medications and allergies. Physical Assessment:   Vitals:   10/13/23 1352  BP: 134/80  Pulse: 78  Weight: 261 lb 6.4 oz (118.6 kg)  Height: 5\' 5"  (1.651 m)   Body mass index is 43.5 kg/m.       Physical Examination:   General appearance: alert, well appearing, and in no distress  Psych: mood appropriate, normal affect  Skin: warm & dry   Cardiovascular: normal heart rate noted  Respiratory: normal respiratory effort, no distress  Pelvic: examination not indicated  Extremities: no edema   Chaperone: N/A    Assessment & Plan:  1) Mixed incontinence -plan for gemtasa, if pt is unable to obtain medication, plan to contact office -continue to work on weight management and other conservative measures  2) Vasomotor symptoms -for now continue with herbal tea -discussed that once she runs out of sample, likely symptoms will worsen -briefly discussed HRT and pt wishes to consider, will plan to start in 1-2 mos after starting gemtasa.  []  pt to call once it has been a few months on this medication -since she is not taking veozah any longer will not check LFTs  No orders of the defined types were placed in this encounter.   Return in about 6 months (around 04/12/2024) for Medication follow up.   Myna Hidalgo, DO Attending Obstetrician & Gynecologist, [facility] for [employer], [facility]

## 2023-10-22 ENCOUNTER — Encounter: Payer: Self-pay | Admitting: Family Medicine

## 2023-10-22 ENCOUNTER — Ambulatory Visit: Payer: Medicare Other | Admitting: Family Medicine

## 2023-10-22 VITALS — BP 132/87 | HR 83 | Temp 98.2°F | Ht 65.0 in | Wt 256.4 lb

## 2023-10-22 DIAGNOSIS — E1169 Type 2 diabetes mellitus with other specified complication: Secondary | ICD-10-CM

## 2023-10-22 DIAGNOSIS — Z7985 Long-term (current) use of injectable non-insulin antidiabetic drugs: Secondary | ICD-10-CM | POA: Diagnosis not present

## 2023-10-22 DIAGNOSIS — M5432 Sciatica, left side: Secondary | ICD-10-CM | POA: Diagnosis not present

## 2023-10-22 MED ORDER — PREDNISONE 20 MG PO TABS: 40.0000 mg | ORAL_TABLET | Freq: Every day | ORAL | 0 refills | Status: AC

## 2023-10-22 NOTE — Progress Notes (Signed)
Subjective:  Patient ID: Paula Butler, female    DOB: 18-Jun-1971, 52 y.o.   MRN: [id number]  Patient Care Team: Sonny Masters, FNP as PCP - General (Family Medicine) Lanelle Bal, DO as Consulting Physician (Gastroenterology)   Chief Complaint:  Medication Problem (Would like to increase Trulicity ) and Groin Pain (Left side x 3-4 days )   HPI: Paula Butler is a 52 y.o. female presenting on 10/22/2023 for Medication Problem (Would like to increase Trulicity ) and Groin Pain (Left side x 3-4 days )   Discussed the use of AI scribe software for clinical note transcription with the patient, who gave verbal consent to proceed.  History of Present Illness   The patient presents with a chief complaint of sharp, radiating pain in the left hip area extending down the leg. This pain, which started approximately four to five days prior to the consultation, is severe enough to wake the patient from sleep. The patient denies any recent injuries or unusual activities that could have triggered the pain. The patient has been self-managing the pain with ibuprofen 800mg , a medication previously prescribed for a different condition. The patient reports no relief from the use of topical treatments such as ice or heat. The pain is described as intermittent, but more prevalent during the night. The patient denies any associated back pain, burning, or tingling in the legs or feet.  In addition to the leg pain, the patient has a history of diabetes and has been on Trulicity. The patient had stopped taking metformin and was considering increasing the dose of Trulicity. However, the last A1c was well controlled at 5.3, and the patient was advised to wait for the next A1c test in a month before making any changes to the medication regimen.          Relevant past medical, surgical, family, and social history reviewed and updated as indicated.  Allergies and medications reviewed and updated. Data  reviewed: Chart in Epic.   Past Medical History:  Diagnosis Date   Diabetes mellitus without complication (HCC)    Hyperlipidemia    Hypertension     Past Surgical History:  Procedure Laterality Date   CHOLECYSTECTOMY     ESOPHAGOGASTRODUODENOSCOPY  06/14/2009   Dr. Otho Najjar; mild esophagitis biopsied, small gastric ulcer-healing s/p biopsied, gastritis biopsied, mild duodenitis biopsied.  Esophageal biopsy unremarkable, gastric and duodenal biopsy with no significant histopathologic features.   EXCISION OF BREAST BIOPSY Left 05/29/2018   fibrocystic changes retroareolar excisional bx   TUBAL LIGATION      Social History   Socioeconomic History   Marital status: Married    Spouse name: Jose   Number of children: 3   Years of education: Not on file   Highest education level: Not on file  Occupational History   Occupation: disability  Tobacco Use   Smoking status: Never   Smokeless tobacco: Never  Vaping Use   Vaping status: Never Used  Substance and Sexual Activity   Alcohol use: No   Drug use: No   Sexual activity: Not Currently    Partners: Male    Birth control/protection: Surgical    Comment: tubal  Other Topics Concern   Not on file  Social History Narrative   Married for 10 years, 3 children, all grown/teenagers.    2 daughters, 1 son - children live with her   Attends church.    Walks for exercise.   Enjoys singing.   Eats  all food groups.   Wears seatbelt.   Born in [place], grew up in [place]. Father in [employer]. Grandmother raised her.    Moved from [place] in 2018.    Does not work out side of home.    Social Determinants of Health   Financial Resource Strain: Low Risk  (07/29/2023)   Overall Financial Resource Strain (CARDIA)    Difficulty of Paying Living Expenses: Not very hard  Food Insecurity: Food Insecurity Present (07/29/2023)   Hunger Vital Sign    Worried About Running Out of Food in the Last Year: Sometimes true    Ran Out of Food in the  Last Year: Sometimes true  Transportation Needs: No Transportation Needs (07/29/2023)   PRAPARE - Administrator, Civil Service (Medical): No    Lack of Transportation (Non-Medical): No  Physical Activity: Insufficiently Active (07/29/2023)   Exercise Vital Sign    Days of Exercise per Week: 2 days    Minutes of Exercise per Session: 40 min  Stress: No Stress Concern Present (07/29/2023)   [employer] of Occupational Health - Occupational Stress Questionnaire    Feeling of Stress : Only a little  Social Connections: Moderately Integrated (07/29/2023)   Social Connection and Isolation Panel [NHANES]    Frequency of Communication with Friends and Family: More than three times a week    Frequency of Social Gatherings with Friends and Family: Twice a week    Attends Religious Services: 1 to 4 times per year    Active Member of [employer] or Organizations: No    Attends Banker Meetings: Never    Marital Status: Married  Catering manager Violence: Not At Risk (07/29/2023)   Humiliation, Afraid, Rape, and Kick questionnaire    Fear of Current or Ex-Partner: No    Emotionally Abused: No    Physically Abused: No    Sexually Abused: No    Outpatient Encounter Medications as of 10/22/2023  Medication Sig   atorvastatin (LIPITOR) 20 MG tablet Take 1 tablet (20 mg total) by mouth daily.   Dulaglutide (TRULICITY) 1.5 MG/0.5ML SOPN INJECT 1.5 MG UNDER THE SKIN WEEKLY   lisinopril (ZESTRIL) 20 MG tablet Take 1 tablet (20 mg total) by mouth daily.   predniSONE (DELTASONE) 20 MG tablet Take 2 tablets (40 mg total) by mouth daily with breakfast for 5 days. 2 po daily for 5 days   sertraline (ZOLOFT) 50 MG tablet Take 1 tablet (50 mg total) by mouth daily.   Vibegron (GEMTESA) 75 MG TABS Take 1 tablet (75 mg total) by mouth daily.   No facility-administered encounter medications on file as of 10/22/2023.    Allergies  Allergen Reactions   Fish Allergy Anaphylaxis, Shortness  Of Breath and Rash   Shrimp [Shellfish Allergy] Anaphylaxis, Shortness Of Breath and Rash   Asa [Aspirin] Other (See Comments)    Abdominal swelling    Penicillins Hives, Itching and Rash    Has patient had a PCN reaction causing immediate rash, facial/tongue/throat swelling, SOB or lightheadedness with hypotension: Yes Has patient had a PCN reaction causing severe rash involving mucus membranes or skin necrosis: No Has patient had a PCN reaction that required hospitalization: Yes Has patient had a PCN reaction occurring within the last 10 years: Yes If all of the above answers are "NO", then may proceed with Cephalosporin use.     Pertinent ROS per HPI, otherwise unremarkable      Objective:  BP 132/87   Pulse 83  Temp 98.2 F (36.8 C) (Temporal)   Ht 5\' 5"  (1.651 m)   Wt 256 lb 6.4 oz (116.3 kg)   SpO2 96%   BMI 42.67 kg/m    Wt Readings from Last 3 Encounters:  10/22/23 256 lb 6.4 oz (116.3 kg)  10/13/23 261 lb 6.4 oz (118.6 kg)  08/13/23 268 lb (121.6 kg)    Physical Exam Vitals and nursing note reviewed.  Constitutional:      General: Paula Butler is not in acute distress.    Appearance: Normal appearance. Paula Butler is obese. Paula Butler is not toxic-appearing or diaphoretic.  HENT:     Head: Normocephalic and atraumatic.     Mouth/Throat:     Mouth: Mucous membranes are moist.  Eyes:     Conjunctiva/sclera: Conjunctivae normal.     Pupils: Pupils are equal, round, and reactive to light.  Cardiovascular:     Rate and Rhythm: Normal rate and regular rhythm.     Heart sounds: Normal heart sounds.  Pulmonary:     Effort: Pulmonary effort is normal.     Breath sounds: Normal breath sounds.  Musculoskeletal:     Lumbar back: No swelling, edema, deformity, signs of trauma, lacerations, spasms, tenderness or bony tenderness. Normal range of motion. Positive left straight leg raise test. Negative right straight leg raise test. No scoliosis.     Left hip: Tenderness present. No deformity,  lacerations, bony tenderness or crepitus. Normal range of motion. Normal strength.     Left upper leg: Tenderness present. No edema, deformity, lacerations or bony tenderness.     Left knee: Normal.     Right lower leg: No edema.     Left lower leg: No edema.  Skin:    General: Skin is warm and dry.     Capillary Refill: Capillary refill takes less than 2 seconds.  Neurological:     General: No focal deficit present.     Mental Status: Paula Butler is alert and oriented to person, place, and time.  Psychiatric:        Mood and Affect: Mood normal.        Behavior: Behavior normal.        Thought Content: Thought content normal.        Judgment: Judgment normal.    Physical Exam   EXTREMITIES: No tenderness on palpation of the back or hip. MUSCULOSKELETAL: No tenderness on palpation of the front of the hip joint.        Results for orders placed or performed in visit on 08/13/23  Lipid panel  Result Value Ref Range   Cholesterol, Total 228 (H) 100 - 199 mg/dL   Triglycerides 161 (H) 0 - 149 mg/dL   HDL 37 (L) >09 mg/dL   VLDL Cholesterol Cal 35 5 - 40 mg/dL   LDL Chol Calc (NIH) 604 (H) 0 - 99 mg/dL   Chol/HDL Ratio 6.2 (H) 0.0 - 4.4 ratio  Bayer DCA Hb A1c Waived  Result Value Ref Range   HB A1C (BAYER DCA - WAIVED) 5.3 4.8 - 5.6 %  Vitamin B12  Result Value Ref Range   Vitamin B-12 513 232 - 1,245 pg/mL       Pertinent labs & imaging results that were available during my care of the patient were reviewed by me and considered in my medical decision making.  Assessment & Plan:  Aariel was seen today for medication problem and groin pain.  Diagnoses and all orders for this visit:  Left sided sciatica -  predniSONE (DELTASONE) 20 MG tablet; Take 2 tablets (40 mg total) by mouth daily with breakfast for 5 days. 2 po daily for 5 days -     Ambulatory referral to Physical Therapy  Type 2 diabetes mellitus with other specified complication, without long-term current use of  insulin (HCC)     Assessment and Plan    Left Hip Pain with Radiation to Leg Sharp pain for 4-5 days, radiating down the leg, possibly sciatica. No relief with ibuprofen 800mg . No associated back pain, burning, or tingling. No recent injuries or unusual activities. -Start a burst of steroids (prednisone) for potential sciatica. -Refer to physical therapy if no relief from prednisone.  Type 2 Diabetes Mellitus Well controlled with Trulicity. Last A1c was 5.3. Patient stopped Metformin. Patient wishes to increase Trulicity dose. -Continue current Trulicity dose. -Recheck A1c at next scheduled follow-up appointment in one month. -Consider increasing Trulicity dose based on next A1c result.          Continue all other maintenance medications.  Follow up plan: Return if symptoms worsen or fail to improve.   Continue healthy lifestyle choices, including diet (rich in fruits, vegetables, and lean proteins, and low in salt and simple carbohydrates) and exercise (at least 30 minutes of moderate physical activity daily).  Educational handout given for sciatica  The above assessment and management plan was discussed with the patient. The patient verbalized understanding of and has agreed to the management plan. Patient is aware to call the clinic if they develop any new symptoms or if symptoms persist or worsen. Patient is aware when to return to the clinic for a follow-up visit. Patient educated on when it is appropriate to go to the emergency department.   Kari Baars, FNP-C [facility] [phone]

## 2023-11-04 ENCOUNTER — Ambulatory Visit: Payer: Medicare Other

## 2023-11-04 VITALS — Ht 65.0 in | Wt 256.0 lb

## 2023-11-04 DIAGNOSIS — Z1211 Encounter for screening for malignant neoplasm of colon: Secondary | ICD-10-CM

## 2023-11-04 DIAGNOSIS — Z0000 Encounter for general adult medical examination without abnormal findings: Secondary | ICD-10-CM | POA: Diagnosis not present

## 2023-11-04 NOTE — Patient Instructions (Signed)
Paula Butler , Thank you for taking time to come for your Medicare Wellness Visit. I appreciate your ongoing commitment to your health goals. Please review the following plan we discussed and let me know if I can assist you in the future.   Referrals/Orders/Follow-Ups/Clinician Recommendations: Aim for 30 minutes of exercise or brisk walking, 6-8 glasses of water, and 5 servings of fruits and vegetables each day.   This is a list of the screening recommended for you and due dates:  Health Maintenance  Topic Date Due   COVID-19 Vaccine (1) Never done   Colon Cancer Screening  Never done   Eye exam for diabetics  07/11/2019   Yearly kidney health urinalysis for diabetes  06/05/2023   Complete foot exam   06/05/2023   Zoster (Shingles) Vaccine (1 of 2) 11/13/2023*   Flu Shot  03/15/2024*   Hemoglobin A1C  02/13/2024   Mammogram  04/22/2024   Yearly kidney function blood test for diabetes  05/12/2024   Medicare Annual Wellness Visit  11/03/2024   Pap with HPV screening  07/28/2028   DTaP/Tdap/Td vaccine (3 - Td or Tdap) 07/14/2030   Hepatitis C Screening  Completed   HIV Screening  Completed   HPV Vaccine  Aged Out  *Topic was postponed. The date shown is not the original due date.    Advanced directives: (Provided) Advance directive discussed with you today. I have provided a copy for you to complete at home and have notarized. Once this is complete, please bring a copy in to our office so we can scan it into your chart. Information on Advanced Care Planning can be found at [facility] Advance Health Care Directives Advance Health Care Directives ([email or web address]    Next Medicare Annual Wellness Visit scheduled for next year: Yes  Insert Preventive Care attachment Insert FALL PREVENTION attachment if needed

## 2023-11-04 NOTE — Progress Notes (Signed)
Subjective:   Paula Butler is a 52 y.o. female who presents for Medicare Annual (Subsequent) preventive examination.  Visit Complete: Virtual I connected with  Timmie Foerster on 11/04/23 by a audio enabled telemedicine application and verified that I am speaking with the correct person using two identifiers.  Patient Location: Home  Provider Location: Home Office  I discussed the limitations of evaluation and management by telemedicine. The patient expressed understanding and agreed to proceed.  Vital Signs: Because this visit was a virtual/telehealth visit, some criteria may be missing or patient reported. Any vitals not documented were not able to be obtained and vitals that have been documented are patient reported.  Patient Medicare AWV questionnaire was completed by the patient on 11/04/2023; I have confirmed that all information answered by patient is correct and no changes since this date.  Cardiac Risk Factors include: advanced age (>64men, >63 women);diabetes mellitus;dyslipidemia;hypertension     Objective:    Today's Vitals   11/04/23 1532  Weight: 256 lb (116.1 kg)  Height: 5\' 5"  (1.651 m)   Body mass index is 42.6 kg/m.     11/04/2023    3:40 PM 01/29/2023    6:52 PM 01/24/2023    8:38 AM 07/25/2022    8:31 AM 07/09/2022    9:22 AM 06/12/2022    2:30 PM 04/15/2022    3:40 PM  Advanced Directives  Does Patient Have a Medical Advance Directive? No No No No No No No  Would patient like information on creating a medical advance directive? Yes (MAU/Ambulatory/Procedural Areas - Information given)  No - Patient declined No - Patient declined No - Patient declined No - Patient declined     Current Medications (verified) Outpatient Encounter Medications as of 11/04/2023  Medication Sig   atorvastatin (LIPITOR) 20 MG tablet Take 1 tablet (20 mg total) by mouth daily.   Dulaglutide (TRULICITY) 1.5 MG/0.5ML SOPN INJECT 1.5 MG UNDER THE SKIN WEEKLY   lisinopril  (ZESTRIL) 20 MG tablet Take 1 tablet (20 mg total) by mouth daily.   sertraline (ZOLOFT) 50 MG tablet Take 1 tablet (50 mg total) by mouth daily.   Vibegron (GEMTESA) 75 MG TABS Take 1 tablet (75 mg total) by mouth daily.   No facility-administered encounter medications on file as of 11/04/2023.    Allergies (verified) Fish allergy, Shrimp [shellfish allergy], Asa [aspirin], and Penicillins   History: Past Medical History:  Diagnosis Date   Diabetes mellitus without complication (HCC)    Hyperlipidemia    Hypertension    Past Surgical History:  Procedure Laterality Date   CHOLECYSTECTOMY     ESOPHAGOGASTRODUODENOSCOPY  06/14/2009   Dr. Otho Najjar; mild esophagitis biopsied, small gastric ulcer-healing s/p biopsied, gastritis biopsied, mild duodenitis biopsied.  Esophageal biopsy unremarkable, gastric and duodenal biopsy with no significant histopathologic features.   EXCISION OF BREAST BIOPSY Left 05/29/2018   fibrocystic changes retroareolar excisional bx   TUBAL LIGATION     Family History  Problem Relation Age of Onset   Diabetes Mother    Hypothyroidism Mother    Hyperlipidemia Mother    Hyperlipidemia Father    Hypertension Father    Diabetes Father    Prostate cancer Father    Breast cancer Sister    Stroke Brother    Pancreatic cancer Brother    Social History   Socioeconomic History   Marital status: Married    Spouse name: Elita Quick   Number of children: 3   Years of education: Not on file  Highest education level: Not on file  Occupational History   Occupation: disability  Tobacco Use   Smoking status: Never   Smokeless tobacco: Never  Vaping Use   Vaping status: Never Used  Substance and Sexual Activity   Alcohol use: No   Drug use: No   Sexual activity: Not Currently    Partners: Male    Birth control/protection: Surgical    Comment: tubal  Other Topics Concern   Not on file  Social History Narrative   Married for 10 years, 3 children, all  grown/teenagers.    2 daughters, 1 son - children live with her   Attends church.    Walks for exercise.   Enjoys singing.   Eats all food groups.   Wears seatbelt.   Born in [place], grew up in [place]. Father in [employer]. Grandmother raised her.    Moved from [place] in 2018.    Does not work out side of home.    Social Determinants of Health   Financial Resource Strain: Low Risk  (11/04/2023)   Overall Financial Resource Strain (CARDIA)    Difficulty of Paying Living Expenses: Not hard at all  Food Insecurity: No Food Insecurity (11/04/2023)   Hunger Vital Sign    Worried About Running Out of Food in the Last Year: Never true    Ran Out of Food in the Last Year: Never true  Transportation Needs: No Transportation Needs (11/04/2023)   PRAPARE - Administrator, Civil Service (Medical): No    Lack of Transportation (Non-Medical): No  Physical Activity: Sufficiently Active (11/04/2023)   Exercise Vital Sign    Days of Exercise per Week: 4 days    Minutes of Exercise per Session: 40 min  Stress: No Stress Concern Present (11/04/2023)   [employer] of Occupational Health - Occupational Stress Questionnaire    Feeling of Stress : Not at all  Social Connections: Moderately Isolated (11/04/2023)   Social Connection and Isolation Panel [NHANES]    Frequency of Communication with Friends and Family: More than three times a week    Frequency of Social Gatherings with Friends and Family: More than three times a week    Attends Religious Services: Never    Database administrator or Organizations: No    Attends Engineer, structural: Never    Marital Status: Married    Tobacco Counseling Counseling given: Not Answered   Clinical Intake:  Pre-visit preparation completed: Yes  Pain : No/denies pain     Nutritional Risks: None Diabetes: Yes CBG done?: No Did pt. bring in CBG monitor from home?: No  How often do you need to have someone help you when  you read instructions, pamphlets, or other written materials from your doctor or pharmacy?: 1 - Never  Interpreter Needed?: No  Information entered by :: Renie Ora, LPN   Activities of Daily Living    11/04/2023    3:41 PM  In your present state of health, do you have any difficulty performing the following activities:  Hearing? 0  Vision? 0  Difficulty concentrating or making decisions? 0  Walking or climbing stairs? 0  Dressing or bathing? 0  Doing errands, shopping? 0  Preparing Food and eating ? N  Using the Toilet? N  In the past six months, have you accidently leaked urine? N  Do you have problems with loss of bowel control? N  Managing your Medications? N  Managing your Finances? N  Housekeeping or managing  your Housekeeping? N    Patient Care Team: Sonny Masters, FNP as PCP - General (Family Medicine) Lanelle Bal, DO as Consulting Physician (Gastroenterology)  Indicate any recent Medical Services you may have received from other than [facility] providers in the past year (date may be approximate).     Assessment:   This is a routine wellness examination for Paula Butler.  Hearing/Vision screen Vision Screening - Comments:: Wears rx glasses - up to date with routine eye exams with  Dr.Davis    Goals Addressed             This Visit's Progress    Exercise 3x per week (30 min per time)         Depression Screen    11/04/2023    3:37 PM 08/13/2023   11:22 AM 07/29/2023   11:31 AM 05/13/2023   11:44 AM 04/01/2023   10:51 AM 01/28/2023    9:02 AM 12/27/2022    4:11 PM  PHQ 2/9 Scores  PHQ - 2 Score 0 1 0 1 2 2 1   PHQ- 9 Score  3 2 4 4 6 6     Fall Risk    11/04/2023    3:34 PM 10/13/2023    1:55 PM 08/13/2023   11:22 AM 07/29/2023   11:41 AM 01/28/2023    9:02 AM  Fall Risk   Falls in the past year? 0 1 0 0 0  Number falls in past yr: 0 0  0   Injury with Fall? 0 0  0   Risk for fall due to : No Fall Risks      Follow up Falls prevention discussed         MEDICARE RISK AT HOME: Medicare Risk at Home Any stairs in or around the home?: Yes If so, are there any without handrails?: No Home free of loose throw rugs in walkways, pet beds, electrical cords, etc?: Yes Adequate lighting in your home to reduce risk of falls?: Yes Life alert?: No Use of a cane, walker or w/c?: No Grab bars in the bathroom?: No Shower chair or bench in shower?: No Elevated toilet seat or a handicapped toilet?: No  TIMED UP AND GO:  Was the test performed?  No    Cognitive Function:        11/04/2023    3:41 PM 06/12/2022    2:33 PM  6CIT Screen  What Year? 0 points 0 points  What month? 0 points 0 points  What time? 0 points 0 points  Count back from 20 0 points 0 points  Months in reverse 0 points 2 points  Repeat phrase 0 points 0 points  Total Score 0 points 2 points    Immunizations Immunization History  Administered Date(s) Administered   Influenza,inj,Quad PF,6+ Mos 10/01/2018   Pneumococcal Conjugate-13 02/27/2018   Td 07/14/2020   Tdap 02/27/2018    TDAP status: Up to date  Flu Vaccine status: Due, Education has been provided regarding the importance of this vaccine. Advised may receive this vaccine at local pharmacy or Health Dept. Aware to provide a copy of the vaccination record if obtained from local pharmacy or Health Dept. Verbalized acceptance and understanding.  Pneumococcal vaccine status: Up to date  Covid-19 vaccine status: Declined, Education has been provided regarding the importance of this vaccine but patient still declined. Advised may receive this vaccine at local pharmacy or Health Dept.or vaccine clinic. Aware to provide a copy of the vaccination record if obtained  from local pharmacy or Health Dept. Verbalized acceptance and understanding.  Qualifies for Shingles Vaccine? Yes   Zostavax completed No   Shingrix Completed?: No.    Education has been provided regarding the importance of this vaccine. Patient has  been advised to call insurance company to determine out of pocket expense if they have not yet received this vaccine. Advised may also receive vaccine at local pharmacy or Health Dept. Verbalized acceptance and understanding.  Screening Tests Health Maintenance  Topic Date Due   COVID-19 Vaccine (1) Never done   Colonoscopy  Never done   OPHTHALMOLOGY EXAM  07/11/2019   Diabetic kidney evaluation - Urine ACR  06/05/2023   FOOT EXAM  06/05/2023   Zoster Vaccines- Shingrix (1 of 2) 11/13/2023 (Originally 04/10/1992)   INFLUENZA VACCINE  03/15/2024 (Originally 07/17/2023)   HEMOGLOBIN A1C  02/13/2024   MAMMOGRAM  04/22/2024   Diabetic kidney evaluation - eGFR measurement  05/12/2024   Medicare Annual Wellness (AWV)  11/03/2024   Cervical Cancer Screening (HPV/Pap Cotest)  07/28/2028   DTaP/Tdap/Td (3 - Td or Tdap) 07/14/2030   Hepatitis C Screening  Completed   HIV Screening  Completed   HPV VACCINES  Aged Out    Health Maintenance  Health Maintenance Due  Topic Date Due   COVID-19 Vaccine (1) Never done   Colonoscopy  Never done   OPHTHALMOLOGY EXAM  07/11/2019   Diabetic kidney evaluation - Urine ACR  06/05/2023   FOOT EXAM  06/05/2023    Colorectal cancer screening: Referral to GI placed 11/04/2023. Pt aware the office will call re: appt.  Mammogram status: Completed 04/23/2023. Repeat every year  Bone Density status: Ordered not of age . Pt provided with contact info and advised to call to schedule appt.  Lung Cancer Screening: (Low Dose CT Chest recommended if Age 60-80 years, 20 pack-year currently smoking OR have quit w/in 15years.) does not qualify.   Lung Cancer Screening Referral: n/a  Additional Screening:  Hepatitis C Screening: does not qualify; Completed 07/09/2022  Vision Screening: Recommended annual ophthalmology exams for early detection of glaucoma and other disorders of the eye. Is the patient up to date with their annual eye exam?  Yes  Who is the  provider or what is the name of the office in which the patient attends annual eye exams? Dr.Davis  If pt is not established with a provider, would they like to be referred to a provider to establish care? No .   Dental Screening: Recommended annual dental exams for proper oral hygiene  Diabetic Foot Exam: Diabetic Foot Exam: Overdue, Pt has been advised about the importance in completing this exam. Pt is scheduled for diabetic foot exam on next office visit .  Community Resource Referral / Chronic Care Management: CRR required this visit?  No   CCM required this visit?  No     Plan:     I have personally reviewed and noted the following in the patient's chart:   Medical and social history Use of alcohol, tobacco or illicit drugs  Current medications and supplements including opioid prescriptions. Patient is not currently taking opioid prescriptions. Functional ability and status Nutritional status Physical activity Advanced directives List of other physicians Hospitalizations, surgeries, and ER visits in previous 12 months Vitals Screenings to include cognitive, depression, and falls Referrals and appointments  In addition, I have reviewed and discussed with patient certain preventive protocols, quality metrics, and best practice recommendations. A written personalized care plan for preventive services as  well as general preventive health recommendations were provided to patient.     Lorrene Reid, LPN   [id number]   After Visit Summary: (MyChart) Due to this being a telephonic visit, the after visit summary with patients personalized plan was offered to patient via MyChart   Nurse Notes: Due Flu Vaccine

## 2023-11-05 ENCOUNTER — Encounter: Payer: Self-pay | Admitting: *Deleted

## 2023-11-10 ENCOUNTER — Ambulatory Visit (HOSPITAL_COMMUNITY): Payer: Medicare Other | Attending: Family Medicine

## 2023-11-10 ENCOUNTER — Other Ambulatory Visit: Payer: Self-pay

## 2023-11-10 DIAGNOSIS — R262 Difficulty in walking, not elsewhere classified: Secondary | ICD-10-CM | POA: Insufficient documentation

## 2023-11-10 DIAGNOSIS — M6281 Muscle weakness (generalized): Secondary | ICD-10-CM | POA: Diagnosis not present

## 2023-11-10 DIAGNOSIS — M5432 Sciatica, left side: Secondary | ICD-10-CM | POA: Diagnosis not present

## 2023-11-10 DIAGNOSIS — M25552 Pain in left hip: Secondary | ICD-10-CM | POA: Diagnosis not present

## 2023-11-10 NOTE — Therapy (Signed)
[facility] OUTPATIENT PHYSICAL THERAPY EVALUATION (LOWER EXTREMITY)   Patient Name: Paula Butler MRN: [id number] DOB:12-07-1971, 52 y.o., female Today's Date: 11/10/2023  END OF SESSION:   PT End of Session - 11/10/23 1542     Visit Number 1    Number of Visits 8    Authorization Type Medicare Part A & B    Authorization Time Period no auth; no limit    Progress Note Due on Visit 10    PT Start Time 1100    PT Stop Time 1140    PT Time Calculation (min) 40 min    Activity Tolerance Patient tolerated treatment well    Behavior During Therapy WFL for tasks assessed/performed              Past Medical History:  Diagnosis Date   Diabetes mellitus without complication (HCC)    Hyperlipidemia    Hypertension    Past Surgical History:  Procedure Laterality Date   CHOLECYSTECTOMY     ESOPHAGOGASTRODUODENOSCOPY  06/14/2009   Dr. Otho Najjar; mild esophagitis biopsied, small gastric ulcer-healing s/p biopsied, gastritis biopsied, mild duodenitis biopsied.  Esophageal biopsy unremarkable, gastric and duodenal biopsy with no significant histopathologic features.   EXCISION OF BREAST BIOPSY Left 05/29/2018   fibrocystic changes retroareolar excisional bx   TUBAL LIGATION     Patient Active Problem List   Diagnosis Date Noted   Depression, major, single episode, moderate (HCC) 12/27/2022   Grief reaction with prolonged bereavement 12/27/2022   Vitamin D deficiency 07/23/2022   Thrombocytopenia (HCC) 07/09/2022   Diabetes mellitus (HCC) 06/04/2022   Hyperlipidemia associated with type 2 diabetes mellitus (HCC) 06/04/2022   Hypertension associated with diabetes (HCC) 06/04/2022   Morbid obesity (HCC) 06/04/2022   Hepatic steatosis 04/07/2018    PCP: Sonny Masters, [facility] Provider (PCP)   REFERRING PROVIDER:   Sonny Masters, FNP    REFERRING DIAG:  Diagnosis  M79.605 (ICD-10-CM) - Anterior leg pain, left    Rationale for Evaluation and Treatment: Rehabilitation  THERAPY  DIAG:  Pain in left hip  Difficulty in walking, not elsewhere classified  Muscle weakness (generalized)  ONSET DATE: ~ 28month ago --------------------------------------------------------------------------------------------- SUBJECTIVE:                                                                                                                                                                                           SUBJECTIVE STATEMENT: Patient woke up ~ 1 month ago to use the restroom @ night and couldn't move left leg for about 10 mins. Patient felt her left hip pop. Patient then went to MD ~2 weeks ago because the pain  has persisted. Patient states the left hip doesn't get "stuck" anymore", just has pain   PERTINENT HISTORY:  DM  HTN  PAIN:  Are you having pain? Yes: NPRS scale: 4-5/10 Pain location: left hip/anterior  Pain description: sharp Aggravating factors: walking Relieving factors: rest  PRECAUTIONS: None  RED FLAGS: None   WEIGHT BEARING RESTRICTIONS: No  FALLS:  Has patient fallen in last 6 months? Yes. Number of falls 1; ~ 2months ago   LIVING ENVIRONMENT: Lives with: lives with their family and lives with their spouse Lives in: House/apartment Stairs: Yes: Internal: 10 steps; none Has following equipment at home: None  OCCUPATION: not working; on disability   PLOF: Independent  PATIENT GOALS: to walk pain free   NEXT MD VISIT: December 2024 --------------------------------------------------------------------------------------------- OBJECTIVE:   DIAGNOSTIC FINDINGS:  Not performed   PATIENT SURVEYS:  LEFS 25/80  SCREENING FOR RED FLAGS: Bowel or bladder incontinence: No Spinal tumors: No Cauda equina syndrome: No Compression fracture: No Abdominal aneurysm: No  COGNITION: Overall cognitive status: Within functional limits for tasks assessed  POSTURE: No Significant postural limitations      FUNCTIONAL TESTS:  2 minute walk test:  next session   GAIT ANALYSIS: Distance walked: 25 Assistive device utilized: None Level of assistance: Complete Independence Comments: Patient with MOD antalgia on left lower extremity; decreased left knee flexion in stance   SENSATION: WFL    LOWER EXTREMITY MMT:    MMT Right eval Left eval  Hip flexion 3+/5 3+/5  Hip extension    Hip abduction    Hip adduction    Hip internal rotation  3-/5  Hip external rotation  3/5  Knee flexion  3+/5  Knee extension  3+/5  Ankle dorsiflexion    Ankle plantarflexion    Ankle inversion    Ankle eversion     (Blank rows = not tested)  LOWER EXTREMITY ROM:     Passive  Right eval Left eval  Hip flexion  0-90 pain  Hip extension    Hip abduction    Hip adduction    Hip internal rotation  0-20 pain  Hip external rotation  [place]  Knee flexion    Knee extension  (5)  Ankle dorsiflexion    Ankle plantarflexion    Ankle inversion    Ankle eversion     (Blank rows = not tested)  LOWER EXTREMITY SPECIAL TESTS  Hip scouring test: positive  and Anterior hip impingement test: positive    (FABER > FADIR > SCOUR > Tr. Sign > LLD)   PALPATION: Moderate tenderness to palpation left hip bursae INTEGUMENTARY   C/D/I --------------------------------------------------------------------------------------------- TODAY'S TREATMENT:                                                                                                                              DATE:   11/10/23 PT Initial Eval Sidelying  Clamshells with right theraband 2x10  Reverse clamshells with right theraband 2x10  PATIENT EDUCATION:  Education details: HEP, activity modification  Person educated: Patient Education method: Explanation Education comprehension: verbalized understanding  HOME EXERCISE PROGRAM: TBD --------------------------------------------------------------------------------------------- ASSESSMENT:  CLINICAL IMPRESSION: Patient is a 52  y.o. y.o. female who was seen today for physical therapy evaluation and treatment for left hip pain, difficulty walking, weakness. Patient presents to PT with the following objective impairments: Abnormal gait, decreased activity tolerance, decreased endurance, difficulty walking, decreased ROM, decreased strength, improper body mechanics, postural dysfunction, obesity, and pain. These impairments limit the patient in activities such as carrying, lifting, bending, standing, squatting, stairs, locomotion level, and caring for others. These impairments also limit the patient in participation such as meal prep, cleaning, laundry, personal finances, interpersonal relationship, driving, shopping, and yard work. The patient will benefit from PT to address the limitations/impairments listed below to return to their prior level of function in the domains of activity and participation.    PERSONAL FACTORS:  n/a  are also affecting patient's functional outcome.   REHAB POTENTIAL: Good  CLINICAL DECISION MAKING: Stable/uncomplicated  EVALUATION COMPLEXITY: Low  --------------------------------------------------------------------------------------------- GOALS: Goals reviewed with patient? No  SHORT TERM GOALS: Target date: 12/08/2023    Patient will score a >/= 33/80  on the LEFS    to demonstrate a Minimally Clinically Importance Difference (MCID) in ADL completion, home/community ambulation, and lifting/bending/squatting.  Baseline: Goal status: INITIAL   2. Patient will be independent with a basic stretching/strengthening HEP  Baseline:  Goal status: INITIAL   LONG TERM GOALS: Target date: 01/05/2024   Patient will score a >/= 41/80 on the LEFS    to demonstrate a Minimally Clinically Importance Difference (MCID) in ADL completion, home/community ambulation, and lifting/bending/squatting. Baseline:  Goal status: INITIAL  2.  Patient will complete >/= 275 feet on 2 minute walk test:    to  demonstrate an improvement in ADL completion, stair negotiation, household/community ambulation, and self-care Baseline:  Goal status: INITIAL  3.  Patient will be independent with a comprehensive strengthening HEP  Baseline:  Goal status: INITIAL  4. Patient will be able to demonstrate left hip internal rotation active range of motion to 0-30 degrees to facilitate ADL completion, lifting, squatting. Baseline:  Goal status: INITIAL  --------------------------------------------------------------------------------------------- PLAN:  PT FREQUENCY: 1-2x/week  PT DURATION: 8 weeks  PLANNED INTERVENTIONS: 97110-Therapeutic exercises, 97530- Therapeutic activity, 97112- Neuromuscular re-education, 97535- Self Care, 91478- Manual therapy, [phone]- Gait training, Patient/Family education, Balance training, Stair training, Dry Needling, Joint mobilization, Joint manipulation, Spinal manipulation, Spinal mobilization, DME instructions, Cryotherapy, and Moist heat.  PLAN FOR NEXT SESSION: Progress lower extremity strengthening    Seymour Bars, PT 11/10/2023, 3:44 PM

## 2023-11-18 ENCOUNTER — Ambulatory Visit (INDEPENDENT_AMBULATORY_CARE_PROVIDER_SITE_OTHER): Payer: Medicare Other | Admitting: Family Medicine

## 2023-11-18 ENCOUNTER — Encounter: Payer: Self-pay | Admitting: Family Medicine

## 2023-11-18 VITALS — BP 136/83 | HR 70 | Temp 96.6°F | Ht 65.0 in | Wt 257.8 lb

## 2023-11-18 DIAGNOSIS — F321 Major depressive disorder, single episode, moderate: Secondary | ICD-10-CM

## 2023-11-18 DIAGNOSIS — E1169 Type 2 diabetes mellitus with other specified complication: Secondary | ICD-10-CM

## 2023-11-18 DIAGNOSIS — E1159 Type 2 diabetes mellitus with other circulatory complications: Secondary | ICD-10-CM | POA: Diagnosis not present

## 2023-11-18 DIAGNOSIS — Z23 Encounter for immunization: Secondary | ICD-10-CM

## 2023-11-18 DIAGNOSIS — F4329 Adjustment disorder with other symptoms: Secondary | ICD-10-CM

## 2023-11-18 DIAGNOSIS — I152 Hypertension secondary to endocrine disorders: Secondary | ICD-10-CM | POA: Diagnosis not present

## 2023-11-18 DIAGNOSIS — E785 Hyperlipidemia, unspecified: Secondary | ICD-10-CM

## 2023-11-18 DIAGNOSIS — F411 Generalized anxiety disorder: Secondary | ICD-10-CM | POA: Diagnosis not present

## 2023-11-18 LAB — BAYER DCA HB A1C WAIVED: HB A1C (BAYER DCA - WAIVED): 5.5 % (ref 4.8–5.6)

## 2023-11-18 MED ORDER — TRULICITY 3 MG/0.5ML ~~LOC~~ SOAJ: 3.0000 mg | SUBCUTANEOUS | 2 refills | Status: DC

## 2023-11-18 MED ORDER — BLOOD GLUCOSE TEST VI STRP: 1.0000 | ORAL_STRIP | Freq: Three times a day (TID) | 0 refills | Status: AC

## 2023-11-18 MED ORDER — ATORVASTATIN CALCIUM 20 MG PO TABS
20.0000 mg | ORAL_TABLET | Freq: Every day | ORAL | 3 refills | Status: AC
Start: 2023-11-18 — End: ?

## 2023-11-18 MED ORDER — LANCET DEVICE MISC: 1.0000 | Freq: Three times a day (TID) | 0 refills | Status: AC

## 2023-11-18 MED ORDER — LANCETS MISC. MISC: 1.0000 | Freq: Three times a day (TID) | 0 refills | Status: AC

## 2023-11-18 MED ORDER — BLOOD GLUCOSE MONITORING SUPPL DEVI: 1.0000 | Freq: Three times a day (TID) | 0 refills | Status: AC

## 2023-11-18 MED ORDER — SERTRALINE HCL 50 MG PO TABS: 100.0000 mg | ORAL_TABLET | Freq: Every day | ORAL | 1 refills | Status: DC

## 2023-11-18 NOTE — Progress Notes (Signed)
Subjective:  Patient ID: Paula Butler, female    DOB: Nov 11, 1971, 52 y.o.   MRN: [id number]  Patient Care Team: Sonny Masters, FNP as PCP - General (Family Medicine) Lanelle Bal, DO as Consulting Physician (Gastroenterology)   Chief Complaint:  No chief complaint on file.   HPI: Paula Butler is a 52 y.o. female presenting on 11/18/2023 for No chief complaint on file.   Discussed the use of AI scribe software for clinical note transcription with the patient, who gave verbal consent to proceed.  History of Present Illness   The patient, diagnosed with diabetes, hypertension, and hyperlipidemia, presents for a routine follow-up. She reports an increase in hunger, but denies any increase in thirst or urination. She has not been checking her blood sugars due to a broken glucose monitor. She is currently on 1.5mg  of Trulicity weekly, Lisinopril, and Atorvastatin, with no reported side effects.  The patient has been experiencing increased stress due to the recent passing of her mother, which has led to an increase in consumption of sweets. She has not followed up with a counselor as previously discussed. She is currently on 50mg  of Sertraline (Zoloft) daily.  The patient also reports difficulty sleeping, with frequent thoughts preventing her from falling asleep. She has tried Melatonin at 5mg , but it has not been effective. She denies any changes in vision, weakness, confusion, swelling in the legs or feet, and chest pain or shortness of breath.         11/18/2023   10:37 AM 11/04/2023    3:37 PM 08/13/2023   11:22 AM 07/29/2023   11:31 AM 05/13/2023   11:44 AM  Depression screen PHQ 2/9  Decreased Interest 1 0 1 0 0  Down, Depressed, Hopeless 2 0 0 0 1  PHQ - 2 Score 3 0 1 0 1  Altered sleeping 1  1 1 1   Tired, decreased energy 1  1 1 1   Change in appetite 1  0 0 1  Feeling bad or failure about yourself  0  0 0 0  Trouble concentrating 1  0 0 0  Moving slowly or  fidgety/restless 0  0 0 0  Suicidal thoughts 0  0 0 0  PHQ-9 Score 7  3 2 4   Difficult doing work/chores Somewhat difficult  Not difficult at all  Not difficult at all      11/18/2023   10:37 AM 08/13/2023   11:23 AM 07/29/2023   11:31 AM 05/13/2023   11:44 AM  GAD 7 : Generalized Anxiety Score  Nervous, Anxious, on Edge  1 1 1   Control/stop worrying 1 1 0 1  Worry too much - different things 1 0 1 0  Trouble relaxing 1 0 1 0  Restless 1 0 0 0  Easily annoyed or irritable 2 2 1 2   Afraid - awful might happen 1 0 0 0  Total GAD 7 Score  4 4 4   Anxiety Difficulty Somewhat difficult Not difficult at all  Not difficult at all        Relevant past medical, surgical, family, and social history reviewed and updated as indicated.  Allergies and medications reviewed and updated. Data reviewed: Chart in Epic.   Past Medical History:  Diagnosis Date   Diabetes mellitus without complication (HCC)    Hyperlipidemia    Hypertension     Past Surgical History:  Procedure Laterality Date   CHOLECYSTECTOMY     ESOPHAGOGASTRODUODENOSCOPY  06/14/2009  Dr. Otho Najjar; mild esophagitis biopsied, small gastric ulcer-healing s/p biopsied, gastritis biopsied, mild duodenitis biopsied.  Esophageal biopsy unremarkable, gastric and duodenal biopsy with no significant histopathologic features.   EXCISION OF BREAST BIOPSY Left 05/29/2018   fibrocystic changes retroareolar excisional bx   TUBAL LIGATION      Social History   Socioeconomic History   Marital status: Married    Spouse name: Jose   Number of children: 3   Years of education: Not on file   Highest education level: Not on file  Occupational History   Occupation: disability  Tobacco Use   Smoking status: Never   Smokeless tobacco: Never  Vaping Use   Vaping status: Never Used  Substance and Sexual Activity   Alcohol use: No   Drug use: No   Sexual activity: Not Currently    Partners: Male    Birth control/protection: Surgical     Comment: tubal  Other Topics Concern   Not on file  Social History Narrative   Married for 10 years, 3 children, all grown/teenagers.    2 daughters, 1 son - children live with her   Attends church.    Walks for exercise.   Enjoys singing.   Eats all food groups.   Wears seatbelt.   Born in [place], grew up in [place]. Father in [employer]. Grandmother raised her.    Moved from [place] in 2018.    Does not work out side of home.    Social Determinants of Health   Financial Resource Strain: Low Risk  (11/04/2023)   Overall Financial Resource Strain (CARDIA)    Difficulty of Paying Living Expenses: Not hard at all  Food Insecurity: No Food Insecurity (11/04/2023)   Hunger Vital Sign    Worried About Running Out of Food in the Last Year: Never true    Ran Out of Food in the Last Year: Never true  Transportation Needs: No Transportation Needs (11/04/2023)   PRAPARE - Administrator, Civil Service (Medical): No    Lack of Transportation (Non-Medical): No  Physical Activity: Sufficiently Active (11/04/2023)   Exercise Vital Sign    Days of Exercise per Week: 4 days    Minutes of Exercise per Session: 40 min  Stress: No Stress Concern Present (11/04/2023)   [employer] of Occupational Health - Occupational Stress Questionnaire    Feeling of Stress : Not at all  Social Connections: Moderately Isolated (11/04/2023)   Social Connection and Isolation Panel [NHANES]    Frequency of Communication with Friends and Family: More than three times a week    Frequency of Social Gatherings with Friends and Family: More than three times a week    Attends Religious Services: Never    Database administrator or Organizations: No    Attends Banker Meetings: Never    Marital Status: Married  Catering manager Violence: Not At Risk (11/04/2023)   Humiliation, Afraid, Rape, and Kick questionnaire    Fear of Current or Ex-Partner: No    Emotionally Abused: No     Physically Abused: No    Sexually Abused: No    Outpatient Encounter Medications as of 11/18/2023  Medication Sig   Blood Glucose Monitoring Suppl DEVI 1 each by Does not apply route in the morning, at noon, and at bedtime. May substitute to any manufacturer covered by patient's insurance.   Dulaglutide (TRULICITY) 3 MG/0.5ML SOAJ Inject 3 mg as directed once a week.   Glucose Blood (BLOOD GLUCOSE TEST  STRIPS) STRP 1 each by In Vitro route in the morning, at noon, and at bedtime. May substitute to any manufacturer covered by patient's insurance.   Lancet Device MISC 1 each by Does not apply route in the morning, at noon, and at bedtime. May substitute to any manufacturer covered by patient's insurance.   Lancets Misc. MISC 1 each by Does not apply route in the morning, at noon, and at bedtime. May substitute to any manufacturer covered by patient's insurance.   lisinopril (ZESTRIL) 20 MG tablet Take 1 tablet (20 mg total) by mouth daily.   [DISCONTINUED] atorvastatin (LIPITOR) 20 MG tablet Take 1 tablet (20 mg total) by mouth daily.   [DISCONTINUED] Dulaglutide (TRULICITY) 1.5 MG/0.5ML SOPN INJECT 1.5 MG UNDER THE SKIN WEEKLY   [DISCONTINUED] sertraline (ZOLOFT) 50 MG tablet Take 1 tablet (50 mg total) by mouth daily.   atorvastatin (LIPITOR) 20 MG tablet Take 1 tablet (20 mg total) by mouth daily.   sertraline (ZOLOFT) 50 MG tablet Take 2 tablets (100 mg total) by mouth daily.   No facility-administered encounter medications on file as of 11/18/2023.    Allergies  Allergen Reactions   Fish Allergy Anaphylaxis, Shortness Of Breath and Rash   Shrimp [Shellfish Allergy] Anaphylaxis, Shortness Of Breath and Rash   Asa [Aspirin] Other (See Comments)    Abdominal swelling    Penicillins Hives, Itching and Rash    Has patient had a PCN reaction causing immediate rash, facial/tongue/throat swelling, SOB or lightheadedness with hypotension: Yes Has patient had a PCN reaction causing severe rash  involving mucus membranes or skin necrosis: No Has patient had a PCN reaction that required hospitalization: Yes Has patient had a PCN reaction occurring within the last 10 years: Yes If all of the above answers are "NO", then may proceed with Cephalosporin use.     Pertinent ROS per HPI, otherwise unremarkable      Objective:  BP 136/83   Pulse 70   Temp (!) 96.6 F (35.9 C) (Temporal)   Ht 5\' 5"  (1.651 m)   Wt 257 lb 12.8 oz (116.9 kg)   SpO2 96%   BMI 42.90 kg/m    Wt Readings from Last 3 Encounters:  11/18/23 257 lb 12.8 oz (116.9 kg)  11/04/23 256 lb (116.1 kg)  10/22/23 256 lb 6.4 oz (116.3 kg)    Physical Exam Vitals and nursing note reviewed.  Constitutional:      General: She is not in acute distress.    Appearance: Normal appearance. She is well-developed and well-groomed. She is morbidly obese. She is not ill-appearing, toxic-appearing or diaphoretic.  HENT:     Head: Normocephalic and atraumatic.     Jaw: There is normal jaw occlusion.     Right Ear: Hearing normal.     Left Ear: Hearing normal.     Nose: Nose normal.     Mouth/Throat:     Lips: Pink.     Mouth: Mucous membranes are moist.     Pharynx: Oropharynx is clear. Uvula midline.  Eyes:     General: Lids are normal.     Extraocular Movements: Extraocular movements intact.     Conjunctiva/sclera: Conjunctivae normal.     Pupils: Pupils are equal, round, and reactive to light.  Neck:     Thyroid: No thyroid mass, thyromegaly or thyroid tenderness.     Vascular: No carotid bruit or JVD.     Trachea: Trachea and phonation normal.  Cardiovascular:     Rate and  Rhythm: Normal rate and regular rhythm.     Chest Wall: PMI is not displaced.     Pulses: Normal pulses.     Heart sounds: Normal heart sounds. No murmur heard.    No friction rub. No gallop.  Pulmonary:     Effort: Pulmonary effort is normal. No respiratory distress.     Breath sounds: Normal breath sounds. No wheezing.  Abdominal:      General: Bowel sounds are normal. There is no distension or abdominal bruit.     Palpations: Abdomen is soft. There is no hepatomegaly or splenomegaly.     Tenderness: There is no abdominal tenderness. There is no right CVA tenderness or left CVA tenderness.     Hernia: No hernia is present.  Musculoskeletal:        General: Normal range of motion.     Cervical back: Normal range of motion and neck supple.     Right lower leg: No edema.     Left lower leg: No edema.  Lymphadenopathy:     Cervical: No cervical adenopathy.  Skin:    General: Skin is warm and dry.     Capillary Refill: Capillary refill takes less than 2 seconds.     Coloration: Skin is not cyanotic, jaundiced or pale.     Findings: No rash.  Neurological:     General: No focal deficit present.     Mental Status: She is alert and oriented to person, place, and time.     Sensory: Sensation is intact.     Motor: Motor function is intact.     Coordination: Coordination is intact.     Gait: Gait is intact.     Deep Tendon Reflexes: Reflexes are normal and symmetric.  Psychiatric:        Attention and Perception: Attention and perception normal.        Mood and Affect: Mood and affect normal.        Speech: Speech normal.        Behavior: Behavior normal. Behavior is cooperative.        Thought Content: Thought content normal.        Cognition and Memory: Cognition and memory normal.        Judgment: Judgment normal.    Physical Exam   CHEST: Clear to auscultation bilaterally. CARDIOVASCULAR: Normal rate and rhythm, no murmurs, rubs, or gallops.        Results for orders placed or performed in visit on 08/13/23  Lipid panel  Result Value Ref Range   Cholesterol, Total 228 (H) 100 - 199 mg/dL   Triglycerides 161 (H) 0 - 149 mg/dL   HDL 37 (L) >09 mg/dL   VLDL Cholesterol Cal 35 5 - 40 mg/dL   LDL Chol Calc (NIH) 604 (H) 0 - 99 mg/dL   Chol/HDL Ratio 6.2 (H) 0.0 - 4.4 ratio  Bayer DCA Hb A1c Waived  Result  Value Ref Range   HB A1C (BAYER DCA - WAIVED) 5.3 4.8 - 5.6 %  Vitamin B12  Result Value Ref Range   Vitamin B-12 513 232 - 1,245 pg/mL       Pertinent labs & imaging results that were available during my care of the patient were reviewed by me and considered in my medical decision making.  Assessment & Plan:  Diagnoses and all orders for this visit:  Type 2 diabetes mellitus with other specified complication, without long-term current use of insulin (HCC) -  Lipid panel -     Bayer DCA Hb A1c Waived -     Dulaglutide (TRULICITY) 3 MG/0.5ML SOAJ; Inject 3 mg as directed once a week. -     Microalbumin / creatinine urine ratio -     Blood Glucose Monitoring Suppl DEVI; 1 each by Does not apply route in the morning, at noon, and at bedtime. May substitute to any manufacturer covered by patient's insurance. -     Glucose Blood (BLOOD GLUCOSE TEST STRIPS) STRP; 1 each by In Vitro route in the morning, at noon, and at bedtime. May substitute to any manufacturer covered by patient's insurance. -     Lancet Device MISC; 1 each by Does not apply route in the morning, at noon, and at bedtime. May substitute to any manufacturer covered by patient's insurance. -     Lancets Misc. MISC; 1 each by Does not apply route in the morning, at noon, and at bedtime. May substitute to any manufacturer covered by patient's insurance.  Hyperlipidemia associated with type 2 diabetes mellitus (HCC) -     Lipid panel -     atorvastatin (LIPITOR) 20 MG tablet; Take 1 tablet (20 mg total) by mouth daily.  Hypertension associated with diabetes (HCC) -     Lipid panel -     Microalbumin / creatinine urine ratio  GAD (generalized anxiety disorder) -     sertraline (ZOLOFT) 50 MG tablet; Take 2 tablets (100 mg total) by mouth daily. -     Ambulatory referral to Psychiatry  Depression, major, single episode, moderate (HCC) -     sertraline (ZOLOFT) 50 MG tablet; Take 2 tablets (100 mg total) by mouth daily. -      Ambulatory referral to Psychiatry  Grief reaction with prolonged bereavement -     sertraline (ZOLOFT) 50 MG tablet; Take 2 tablets (100 mg total) by mouth daily. -     Ambulatory referral to Psychiatry  Morbid obesity (HCC) -     Lipid panel -     Bayer DCA Hb A1c Waived -     Dulaglutide (TRULICITY) 3 MG/0.5ML SOAJ; Inject 3 mg as directed once a week.  Need for influenza vaccination -     Flu vaccine trivalent PF, 6mos and older(Flulaval,Afluria,Fluarix,Fluzone)     Assessment and Plan    Type 2 Diabetes Mellitus   Presents for follow-up. Reports increased hunger, likely due to stress and recent bereavement. Currently on Trulicity 1.5 mg weekly. A1c is 5.5%. No symptoms of hypoglycemia reported. Discussed increasing Trulicity to 3 mg weekly with the understanding that if A1c drops further, the dose may need to be reduced. Informed about symptoms of hypoglycemia (weakness, dizziness, sweating, increased fatigue) and advised to report if she occurs.   - Increase Trulicity to 3 mg weekly   - Provide prescription for a new blood glucose monitor   - Check A1c at next visit in three months    Major Depressive Disorder   Reports increased stress and anxiety, likely exacerbated by recent bereavement. Currently on sertraline 50 mg daily. No thoughts of self-harm or harm to others. Discussed increasing sertraline to 100 mg daily to help manage anxiety and stress eating. Informed about potential benefits of increased dose and referred to a counselor for additional support.   - Increase sertraline to 100 mg daily   - Refer to a counselor for additional support    Insomnia   Reports difficulty sleeping, likely related to anxiety and stress. Currently using  melatonin 5 mg. Discussed increasing melatonin to 10 mg, and if needed, up to 15 mg. Informed that increasing sertraline dose may also help with sleep by reducing anxiety.   - Increase melatonin to 10 mg, and if needed, up to 15 mg   -  Monitor sleep patterns and effectiveness of increased melatonin dose    Hypertension   Well-controlled on current medication regimen. No reported issues with lisinopril.   - Continue lisinopril as prescribed    Hyperlipidemia   Well-controlled on atorvastatin. No reported muscle aches or pains.   - Continue atorvastatin as prescribed    General Health Maintenance   Routine health maintenance discussed. No new symptoms or changes in vision, swelling, chest pain, or shortness of breath.   - Administer flu shot   - Obtain urine sample to check microalbumin for kidney function    Follow-up   - Follow-up appointment in three months   - Contact if any new symptoms or changes occur.          Continue all other maintenance medications.  Follow up plan: Return in about 3 months (around 02/16/2024), or if symptoms worsen or fail to improve, for chronic follow up, all labs.   Continue healthy lifestyle choices, including diet (rich in fruits, vegetables, and lean proteins, and low in salt and simple carbohydrates) and exercise (at least 30 minutes of moderate physical activity daily).  Educational handout given for DM  The above assessment and management plan was discussed with the patient. The patient verbalized understanding of and has agreed to the management plan. Patient is aware to call the clinic if they develop any new symptoms or if symptoms persist or worsen. Patient is aware when to return to the clinic for a follow-up visit. Patient educated on when it is appropriate to go to the emergency department.   Kari Baars, FNP-C [facility] [phone]

## 2023-11-18 NOTE — Patient Instructions (Addendum)
Continue to monitor your blood sugars as we discussed and record them. Bring the log to your next appointment.  Take your medications as directed.   ? ?Goal Blood glucose:  ?  Fasting (before meals) = 80 to 130 ?  Within 2 hours of eating = less than 180 ? ? ?Understanding your Hemoglobin A1c: 5.5 ? ? ? ? ?Diabetes Mellitus and Nutrition ? ? ? ?I think that you would greatly benefit from seeing a nutritionist. If this is something you are interested in, please call Dr Jenne Campus at [phone] to schedule an appointment. ? ? ?When you have diabetes (diabetes mellitus), it is very important to have healthy eating habits because your blood sugar (glucose) levels are greatly affected by what you eat and drink. Eating healthy foods in the appropriate amounts, at about the same times every day, can help you: ?Control your blood glucose. ?Lower your risk of heart disease. ?Improve your blood pressure. ?Reach or maintain a healthy weight. ? ?Every person with diabetes is different, and each person has different needs for a meal plan. Your health care provider may recommend that you work with a diet and nutrition specialist (dietitian) to make a meal plan that is best for you. Your meal plan may vary depending on factors such as: ?The calories you need. ?The medicines you take. ?Your weight. ?Your blood glucose, blood pressure, and cholesterol levels. ?Your activity level. ?Other health conditions you have, such as heart or kidney disease. ? ?How do carbohydrates affect me? ?Carbohydrates affect your blood glucose level more than any other type of food. Eating carbohydrates naturally increases the amount of glucose in your blood. Carbohydrate counting is a method for keeping track of how many carbohydrates you eat. Counting carbohydrates is important to keep your blood glucose at a healthy level, especially if you use insulin or take certain oral diabetes medicines. ?It is important to know how many carbohydrates you can safely  have in each meal. This is different for every person. Your dietitian can help you calculate how many carbohydrates you should have at each meal and for snack. ?Foods that contain carbohydrates include: ?Bread, cereal, rice, pasta, and crackers. ?Potatoes and corn. ?Peas, beans, and lentils. ?Milk and yogurt. ?Fruit and juice. ?Desserts, such as cakes, cookies, ice cream, and candy. ? ?How does alcohol affect me? ?Alcohol can cause a sudden decrease in blood glucose (hypoglycemia), especially if you use insulin or take certain oral diabetes medicines. Hypoglycemia can be a life-threatening condition. Symptoms of hypoglycemia (sleepiness, dizziness, and confusion) are similar to symptoms of having too much alcohol. ?If your health care provider says that alcohol is safe for you, follow these guidelines: ?Limit alcohol intake to no more than 1 drink per day for nonpregnant women and 2 drinks per day for men. One drink equals 12 oz of beer, 5 oz of wine, or 1? oz of hard liquor. ?Do not drink on an empty stomach. ?Keep yourself hydrated with water, diet soda, or unsweetened iced tea. ?Keep in mind that regular soda, juice, and other mixers may contain a lot of sugar and must be counted as carbohydrates. ? ?What are tips for following this plan? ? ?Reading food labels ?Start by checking the serving size on the label. The amount of calories, carbohydrates, fats, and other nutrients listed on the label are based on one serving of the food. Many foods contain more than one serving per package. ?Check the total grams (g) of carbohydrates in one serving. You can calculate  number of servings of carbohydrates in one serving by dividing the total carbohydrates by 15. For example, if a food has 30 g of total carbohydrates, it would be equal to 2 servings of carbohydrates. Check the number of grams (g) of saturated and trans fats in one serving. Choose foods that have low or no amount of these fats. Check the number of  milligrams (mg) of sodium in one serving. Most people should limit total sodium intake to less than 2,300 mg per day. Always check the nutrition information of foods labeled as "low-fat" or "nonfat". These foods may be higher in added sugar or refined carbohydrates and should be avoided. Talk to your dietitian to identify your daily goals for nutrients listed on the label.  Shopping Avoid buying canned, premade, or processed foods. These foods tend to be high in fat, sodium, and added sugar. Shop around the outside edge of the grocery store. This includes fresh fruits and vegetables, bulk grains, fresh meats, and fresh dairy.  Cooking Use low-heat cooking methods, such as baking, instead of high-heat cooking methods like deep frying. Cook using healthy oils, such as olive, canola, or sunflower oil. Avoid cooking with butter, cream, or high-fat meats.  Meal planning Eat meals and snacks regularly, preferably at the same times every day. Avoid going long periods of time without eating. Eat foods high in fiber, such as fresh fruits, vegetables, beans, and whole grains. Talk to your dietitian about how many servings of carbohydrates you can eat at each meal. Eat 4-6 ounces of lean protein each day, such as lean meat, chicken, fish, eggs, or tofu. 1 ounce is equal to 1 ounce of meat, chicken, or fish, 1 egg, or 1/4 cup of tofu. Eat some foods each day that contain healthy fats, such as avocado, nuts, seeds, and fish.  Lifestyle  Check your blood glucose regularly. Exercise at least 30 minutes 5 or more days each week, or as told by your health care provider. Take medicines as told by your health care provider. Do not use any products that contain nicotine or tobacco, such as cigarettes and e-cigarettes. If you need help quitting, ask your health care provider. Work with a counselor or diabetes educator to identify strategies to manage stress and any emotional and social challenges.  What are  some questions to ask my health care provider? Do I need to meet with a diabetes educator? Do I need to meet with a dietitian? What number can I call if I have questions? When are the best times to check my blood glucose?  Where to find more information: American Diabetes Association: diabetes.org/food-and-fitness/food Academy of Nutrition and Dietetics: www.eatright.org/resources/health/diseases-and-conditions/diabetes National Institute of Diabetes and Digestive and Kidney Diseases (NIH): www.niddk.nih.gov/health-information/diabetes/overview/diet-eating-physical-activity  Summary A healthy meal plan will help you control your blood glucose and maintain a healthy lifestyle. Working with a diet and nutrition specialist (dietitian) can help you make a meal plan that is best for you. Keep in mind that carbohydrates and alcohol have immediate effects on your blood glucose levels. It is important to count carbohydrates and to use alcohol carefully. This information is not intended to replace advice given to you by your health care provider. Make sure you discuss any questions you have with your health care provider. Document Released: 08/29/2005 Document Revised: 01/06/2017 Document Reviewed: 01/06/2017 Elsevier Interactive Patient Education  2018 [employer]    

## 2023-11-19 LAB — LIPID PANEL
Chol/HDL Ratio: 6.3 {ratio} — ABNORMAL HIGH (ref 0.0–4.4)
Cholesterol, Total: 238 mg/dL — ABNORMAL HIGH (ref 100–199)
HDL: 38 mg/dL — ABNORMAL LOW (ref >39)
LDL Chol Calc (NIH): 177 mg/dL — ABNORMAL HIGH (ref 0–99)
Triglycerides: 125 mg/dL (ref 0–149)
VLDL Cholesterol Cal: 23 mg/dL (ref 5–40)

## 2023-11-20 LAB — MICROALBUMIN / CREATININE URINE RATIO
Creatinine, Urine: 118.5 mg/dL
Microalb/Creat Ratio: 6 mg/g{creat} (ref 0–29)
Microalbumin, Urine: 7.5 ug/mL

## 2023-11-21 DIAGNOSIS — E119 Type 2 diabetes mellitus without complications: Secondary | ICD-10-CM | POA: Diagnosis not present

## 2023-11-21 DIAGNOSIS — Z7984 Long term (current) use of oral hypoglycemic drugs: Secondary | ICD-10-CM | POA: Diagnosis not present

## 2023-11-21 LAB — HM DIABETES EYE EXAM

## 2023-11-25 ENCOUNTER — Encounter (HOSPITAL_COMMUNITY): Payer: Medicare Other

## 2023-11-27 ENCOUNTER — Ambulatory Visit (HOSPITAL_COMMUNITY): Payer: Medicare Other | Attending: Family Medicine

## 2023-11-27 DIAGNOSIS — M6281 Muscle weakness (generalized): Secondary | ICD-10-CM

## 2023-11-27 DIAGNOSIS — R262 Difficulty in walking, not elsewhere classified: Secondary | ICD-10-CM

## 2023-11-27 DIAGNOSIS — M25552 Pain in left hip: Secondary | ICD-10-CM | POA: Diagnosis not present

## 2023-11-27 NOTE — Therapy (Signed)
[facility] OUT[facility] PH[facility]Y TREATMENT   Patient Name: Paula Butler MRN: [id number] DOB:09-Feb-1971, 52 y.o., female Today's Date: 11/27/2023  END OF SESSION:   PT End of Session - 11/27/23 0802     Visit Number 2    Number of Visits 8    Authorization Type Medicare Part A & B    Authorization Time Period no auth; no limit    Progress Note Due on Visit 10    PT Start Time 0800    PT Stop Time 0840    PT Time Calculation (min) 40 min    Activity Tolerance Patient tolerated treatment well    Behavior During Therapy WFL for tasks assessed/performed              Past Medical History:  Diagnosis Date   Diabetes mellitus without complication (HCC)    Hyperlipidemia    Hypertension    Past Surgical History:  Procedure Laterality Date   CHOLECYSTECTOMY     ESOPHAGOGASTRODUODENOSCOPY  06/14/2009   Dr. Otho Najjar; mild esophagitis biopsied, small gastric ulcer-healing s/p biopsied, gastritis biopsied, mild duodenitis biopsied.  Esophageal biopsy unremarkable, gastric and duodenal biopsy with no significant histopathologic features.   EXCISION OF BREAST BIOPSY Left 05/29/2018   fibrocystic changes retroareolar excisional bx   TUBAL LIGATION     Patient Active Problem List   Diagnosis Date Noted   Depression, major, single episode, moderate (HCC) 12/27/2022   Grief reaction with prolonged bereavement 12/27/2022   Vitamin D deficiency 07/23/2022   Thrombocytopenia (HCC) 07/09/2022   Diabetes mellitus (HCC) 06/04/2022   Hyperlipidemia associated with type 2 diabetes mellitus (HCC) 06/04/2022   Hypertension associated with diabetes (HCC) 06/04/2022   Morbid obesity (HCC) 06/04/2022   Hepatic steatosis 04/07/2018    PCP: Sonny Masters, [facility] Provider (PCP)   REFERRING PROVIDER:   Sonny Masters, FNP    REFERRING DIAG:  Diagnosis  M79.605 (ICD-10-CM) - Anterior leg pain, left    Rationale for Evaluation and Treatment: Rehabilitation  THERAPY DIAG:  Pain in  left hip  Difficulty in walking, not elsewhere classified  Muscle weakness (generalized)  ONSET DATE: ~ 80month ago --------------------------------------------------------------------------------------------- SUBJECTIVE:                                                                                                                                                                                           SUBJECTIVE STATEMENT: Patient reports 6/10 pain. Reports HEP has helped her with the hip.     Patient woke up ~ 1 month ago to use the restroom @ night and couldn't move left leg for about 10 mins. Patient felt her  left hip pop. Patient then went to MD ~2 weeks ago because the pain has persisted. Patient states the left hip doesn't get "stuck" anymore", just has pain   PERTINENT HISTORY:  DM  HTN  PAIN:  Are you having pain? Yes: NPRS scale: 4-5/10 Pain location: left hip/anterior  Pain description: sharp Aggravating factors: walking Relieving factors: rest  PRECAUTIONS: None  RED FLAGS: None   WEIGHT BEARING RESTRICTIONS: No  FALLS:  Has patient fallen in last 6 months? Yes. Number of falls 1; ~ 2months ago   LIVING ENVIRONMENT: Lives with: lives with their family and lives with their spouse Lives in: House/apartment Stairs: Yes: Internal: 10 steps; none Has following equipment at home: None  OCCUPATION: not working; on disability   PLOF: Independent  PATIENT GOALS: to walk pain free   NEXT MD VISIT: December 2024 --------------------------------------------------------------------------------------------- OBJECTIVE:   DIAGNOSTIC FINDINGS:  Not performed   PATIENT SURVEYS:  LEFS 25/80  SCREENING FOR RED FLAGS: Bowel or bladder incontinence: No Spinal tumors: No Cauda equina syndrome: No Compression fracture: No Abdominal aneurysm: No  COGNITION: Overall cognitive status: Within functional limits for tasks assessed  POSTURE: No Significant postural  limitations      FUNCTIONAL TESTS:  2 minute walk test: next session   GAIT ANALYSIS: Distance walked: 25 Assistive device utilized: None Level of assistance: Complete Independence Comments: Patient with MOD antalgia on left lower extremity; decreased left knee flexion in stance   SENSATION: WFL    LOWER EXTREMITY MMT:    MMT Right eval Left eval  Hip flexion 3+/5 3+/5  Hip extension    Hip abduction    Hip adduction    Hip internal rotation  3-/5  Hip external rotation  3/5  Knee flexion  3+/5  Knee extension  3+/5  Ankle dorsiflexion    Ankle plantarflexion    Ankle inversion    Ankle eversion     (Blank rows = not tested)  LOWER EXTREMITY ROM:     Passive  Right eval Left eval  Hip flexion  0-90 pain  Hip extension    Hip abduction    Hip adduction    Hip internal rotation  0-20 pain  Hip external rotation  [facility]  Knee flexion    Knee extension  (5)  Ankle dorsiflexion    Ankle plantarflexion    Ankle inversion    Ankle eversion     (Blank rows = not tested)  LOWER EXTREMITY SPECIAL TESTS  Hip scouring test: positive  and Anterior hip impingement test: positive    (FABER > FADIR > SCOUR > Tr. Sign > LLD)   PALPATION: Moderate tenderness to palpation left hip bursae INTEGUMENTARY   C/D/I --------------------------------------------------------------------------------------------- TODAY'S TREATMENT:                                                                                                                              DATE:   11/27/23 Supine  Manual therapy  to left hip: short arc distraction with [employer], mobilization with movement into flexion   Hip marches with G theraband, bilateral 2x10  Partial SLRs off 1/2 foam, bilateral 2x10   11/10/23 PT Initial Eval Sidelying  Clamshells with right theraband 2x10  Reverse clamshells with right theraband 2x10   PATIENT EDUCATION:  Education details: HEP, activity modification   Person educated: Patient Education method: Explanation Education comprehension: verbalized understanding  HOME EXERCISE PROGRAM: Access Code: [email or web address] URL: [email or web address] Date: 11/27/2023 Prepared by: Seymour Bars  Exercises - Supine Hip Flexion with Resistance Loop  - 1-2 x daily - 2 sets - 10 reps - Small Range Straight Leg Raise  - 1-2 x daily - 2 sets - 10 reps - Clamshell with Resistance  - 1-2 x daily - 2 sets - 10 reps - Sidelying Reverse Clamshell with Resistance  - 1-2 x daily - 2 sets - 10 reps --------------------------------------------------------------------------------------------- ASSESSMENT:  CLINICAL IMPRESSION: PT progress patient with hip active range of motion, strengthening therapeutic exercise. PT reviewed added exercises for hip flexion strength to HEP. Patient with more weakness on left > right hip; tolerates well with no pain.  Patient will continue to benefit from PT to return to prior level of function    Patient is a 52 y.o. y.o. female who was seen today for physical therapy evaluation and treatment for left hip pain, difficulty walking, weakness. Patient presents to PT with the following objective impairments: Abnormal gait, decreased activity tolerance, decreased endurance, difficulty walking, decreased ROM, decreased strength, improper body mechanics, postural dysfunction, obesity, and pain. These impairments limit the patient in activities such as carrying, lifting, bending, standing, squatting, stairs, locomotion level, and caring for others. These impairments also limit the patient in participation such as meal prep, cleaning, laundry, personal finances, interpersonal relationship, driving, shopping, and yard work. The patient will benefit from PT to address the limitations/impairments listed below to return to their prior level of function in the domains of activity and participation.    PERSONAL FACTORS:  n/a  are also affecting  patient's functional outcome.   REHAB POTENTIAL: Good  CLINICAL DECISION MAKING: Stable/uncomplicated  EVALUATION COMPLEXITY: Low  --------------------------------------------------------------------------------------------- GOALS: Goals reviewed with patient? No  SHORT TERM GOALS: Target date: 12/08/2023    Patient will score a >/= 33/80  on the LEFS    to demonstrate a Minimally Clinically Importance Difference (MCID) in ADL completion, home/community ambulation, and lifting/bending/squatting.  Baseline: Goal status: INITIAL   2. Patient will be independent with a basic stretching/strengthening HEP  Baseline:  Goal status: INITIAL   LONG TERM GOALS: Target date: 01/05/2024   Patient will score a >/= 41/80 on the LEFS    to demonstrate a Minimally Clinically Importance Difference (MCID) in ADL completion, home/community ambulation, and lifting/bending/squatting. Baseline:  Goal status: INITIAL  2.  Patient will complete >/= 275 feet on 2 minute walk test:    to demonstrate an improvement in ADL completion, stair negotiation, household/community ambulation, and self-care Baseline:  Goal status: INITIAL  3.  Patient will be independent with a comprehensive strengthening HEP  Baseline:  Goal status: INITIAL  4. Patient will be able to demonstrate left hip internal rotation active range of motion to 0-30 degrees to facilitate ADL completion, lifting, squatting. Baseline:  Goal status: INITIAL  --------------------------------------------------------------------------------------------- PLAN:  PT FREQUENCY: 1-2x/week  PT DURATION: 8 weeks  PLANNED INTERVENTIONS: [id number]-Therapeutic exercises, 97530- Therapeutic activity, [id number]- Neuromuscular re-education, 97535- Self Care, [id number]- Manual therapy, [phone]- Gait training, Patient/Family education, Balance training,  Stair training, Dry Needling, Joint mobilization, Joint manipulation, Spinal manipulation, Spinal mobilization,  DME instructions, Cryotherapy, and Moist heat.  PLAN FOR NEXT SESSION: Progress lower extremity strengthening    Seymour Bars, PT 11/27/2023, 8:03 AM

## 2023-12-01 ENCOUNTER — Encounter (HOSPITAL_COMMUNITY): Payer: Medicare Other

## 2023-12-08 ENCOUNTER — Ambulatory Visit (HOSPITAL_COMMUNITY): Payer: Medicare Other

## 2023-12-08 DIAGNOSIS — M25552 Pain in left hip: Secondary | ICD-10-CM | POA: Diagnosis not present

## 2023-12-08 DIAGNOSIS — M6281 Muscle weakness (generalized): Secondary | ICD-10-CM | POA: Diagnosis not present

## 2023-12-08 DIAGNOSIS — R262 Difficulty in walking, not elsewhere classified: Secondary | ICD-10-CM | POA: Diagnosis not present

## 2023-12-08 NOTE — Therapy (Signed)
[facility] OUTPATIENT PHYSICAL THERAPY TREATMENT   Patient Name: Paula Butler MRN: [id number] DOB:1971-02-02, 52 y.o., female Today's Date: 12/08/2023  END OF SESSION:   PT End of Session - 12/08/23 0847     Visit Number 3    Number of Visits 8    Date for PT Re-Evaluation 01/05/24    Authorization Type Medicare Part A & B    Authorization Time Period no auth; no limit    Progress Note Due on Visit 10    PT Start Time 0848    PT Stop Time 0928    PT Time Calculation (min) 40 min    Activity Tolerance Patient tolerated treatment well    Behavior During Therapy WFL for tasks assessed/performed              Past Medical History:  Diagnosis Date   Diabetes mellitus without complication (HCC)    Hyperlipidemia    Hypertension    Past Surgical History:  Procedure Laterality Date   CHOLECYSTECTOMY     ESOPHAGOGASTRODUODENOSCOPY  06/14/2009   Dr. Otho Najjar; mild esophagitis biopsied, small gastric ulcer-healing s/p biopsied, gastritis biopsied, mild duodenitis biopsied.  Esophageal biopsy unremarkable, gastric and duodenal biopsy with no significant histopathologic features.   EXCISION OF BREAST BIOPSY Left 05/29/2018   fibrocystic changes retroareolar excisional bx   TUBAL LIGATION     Patient Active Problem List   Diagnosis Date Noted   Depression, major, single episode, moderate (HCC) 12/27/2022   Grief reaction with prolonged bereavement 12/27/2022   Vitamin D deficiency 07/23/2022   Thrombocytopenia (HCC) 07/09/2022   Diabetes mellitus (HCC) 06/04/2022   Hyperlipidemia associated with type 2 diabetes mellitus (HCC) 06/04/2022   Hypertension associated with diabetes (HCC) 06/04/2022   Morbid obesity (HCC) 06/04/2022   Hepatic steatosis 04/07/2018    PCP: Sonny Masters, [facility] Provider (PCP)   REFERRING PROVIDER:   Sonny Masters, FNP    REFERRING DIAG:  Diagnosis  M79.605 (ICD-10-CM) - Anterior leg pain, left    Rationale for Evaluation and Treatment:  Rehabilitation  THERAPY DIAG:  Pain in left hip  Difficulty in walking, not elsewhere classified  Muscle weakness (generalized)  ONSET DATE: ~ 49month ago --------------------------------------------------------------------------------------------- SUBJECTIVE:                                                                                                                                                                                           SUBJECTIVE STATEMENT: Left hip pain is constant; seems to be worse with cold weather.  7/10 pain this morning.      Patient woke up ~ 1 month ago to use the restroom @  night and couldn't move left leg for about 10 mins. Patient felt her left hip pop. Patient then went to MD ~2 weeks ago because the pain has persisted. Patient states the left hip doesn't get "stuck" anymore", just has pain   PERTINENT HISTORY:  DM  HTN  PAIN:  Are you having pain? Yes: NPRS scale: 4-5/10 Pain location: left hip/anterior  Pain description: sharp Aggravating factors: walking Relieving factors: rest  PRECAUTIONS: None  RED FLAGS: None   WEIGHT BEARING RESTRICTIONS: No  FALLS:  Has patient fallen in last 6 months? Yes. Number of falls 1; ~ 2months ago   LIVING ENVIRONMENT: Lives with: lives with their family and lives with their spouse Lives in: House/apartment Stairs: Yes: Internal: 10 steps; none Has following equipment at home: None  OCCUPATION: not working; on disability   PLOF: Independent  PATIENT GOALS: to walk pain free   NEXT MD VISIT: December 2024 --------------------------------------------------------------------------------------------- OBJECTIVE:   DIAGNOSTIC FINDINGS:  Not performed   PATIENT SURVEYS:  LEFS 25/80  SCREENING FOR RED FLAGS: Bowel or bladder incontinence: No Spinal tumors: No Cauda equina syndrome: No Compression fracture: No Abdominal aneurysm: No  COGNITION: Overall cognitive status: Within  functional limits for tasks assessed  POSTURE: No Significant postural limitations      FUNCTIONAL TESTS:  2 minute walk test: next session   GAIT ANALYSIS: Distance walked: 25 Assistive device utilized: None Level of assistance: Complete Independence Comments: Patient with MOD antalgia on left lower extremity; decreased left knee flexion in stance   SENSATION: WFL    LOWER EXTREMITY MMT:    MMT Right eval Left eval  Hip flexion 3+/5 3+/5  Hip extension    Hip abduction    Hip adduction    Hip internal rotation  3-/5  Hip external rotation  3/5  Knee flexion  3+/5  Knee extension  3+/5  Ankle dorsiflexion    Ankle plantarflexion    Ankle inversion    Ankle eversion     (Blank rows = not tested)  LOWER EXTREMITY ROM:     Passive  Right eval Left eval  Hip flexion  0-90 pain  Hip extension    Hip abduction    Hip adduction    Hip internal rotation  0-20 pain  Hip external rotation  [facility]  Knee flexion    Knee extension  (5)  Ankle dorsiflexion    Ankle plantarflexion    Ankle inversion    Ankle eversion     (Blank rows = not tested)  LOWER EXTREMITY SPECIAL TESTS  Hip scouring test: positive  and Anterior hip impingement test: positive    (FABER > FADIR > SCOUR > Tr. Sign > LLD)   PALPATION: Moderate tenderness to palpation left hip bursae INTEGUMENTARY   C/D/I --------------------------------------------------------------------------------------------- TODAY'S TREATMENT:  DATE:  12/08/23 Nustep seat 6 x 5' dynamic warm up  Supine: Manual hip distraction 2 x 10; 5"hold each Hooklying clam with Green theraband 2 x 10 Hooklying march with green theraband 2 x 10 Bridge 2 x 10 Hip adduction 5" hold 2 x 10 with ball Glute sets 5" hold 2 x 10  Sit to stand from low mat table 2 x 10    11/27/23 Supine  Manual therapy to  left hip: short arc distraction with [facility], mobilization with movement into flexion   Hip marches with G theraband, bilateral 2x10  Partial SLRs off 1/2 foam, bilateral 2x10   11/10/23 PT Initial Eval Sidelying  Clamshells with right theraband 2x10  Reverse clamshells with right theraband 2x10   PATIENT EDUCATION:  Education details: HEP, activity modification  Person educated: Patient Education method: Explanation Education comprehension: verbalized understanding  HOME EXERCISE PROGRAM: Access Code: [email or web address] URL: [email or web address] Date: 11/27/2023 Prepared by: Seymour Bars  Exercises - Supine Hip Flexion with Resistance Loop  - 1-2 x daily - 2 sets - 10 reps - Small Range Straight Leg Raise  - 1-2 x daily - 2 sets - 10 reps - Clamshell with Resistance  - 1-2 x daily - 2 sets - 10 reps - Sidelying Reverse Clamshell with Resistance  - 1-2 x daily - 2 sets - 10 reps --------------------------------------------------------------------------------------------- ASSESSMENT:  CLINICAL IMPRESSION: Continued to work on left hip mobility and strength.  Started with Nustep dynamic warm up.  Patient reports decreased pain to 1/10 with manual hip distraction; pain described as a "pinch".  With exercise pain creeps back up to 5/10.  Progressed hip strengthening today; patient with good effort and attention to form. 4/10 pain at the end of treatment.   Patient will continue to benefit from PT to return to prior level of function    Patient is a 52 y.o. y.o. female who was seen today for physical therapy evaluation and treatment for left hip pain, difficulty walking, weakness. Patient presents to PT with the following objective impairments: Abnormal gait, decreased activity tolerance, decreased endurance, difficulty walking, decreased ROM, decreased strength, improper body mechanics, postural dysfunction, obesity, and pain. These impairments limit the patient in  activities such as carrying, lifting, bending, standing, squatting, stairs, locomotion level, and caring for others. These impairments also limit the patient in participation such as meal prep, cleaning, laundry, personal finances, interpersonal relationship, driving, shopping, and yard work. The patient will benefit from PT to address the limitations/impairments listed below to return to their prior level of function in the domains of activity and participation.    PERSONAL FACTORS:  n/a  are also affecting patient's functional outcome.   REHAB POTENTIAL: Good  CLINICAL DECISION MAKING: Stable/uncomplicated  EVALUATION COMPLEXITY: Low  --------------------------------------------------------------------------------------------- GOALS: Goals reviewed with patient? No  SHORT TERM GOALS: Target date: 12/08/2023    Patient will score a >/= 33/80  on the LEFS    to demonstrate a Minimally Clinically Importance Difference (MCID) in ADL completion, home/community ambulation, and lifting/bending/squatting.  Baseline: Goal status: INITIAL   2. Patient will be independent with a basic stretching/strengthening HEP  Baseline:  Goal status: INITIAL   LONG TERM GOALS: Target date: 01/05/2024   Patient will score a >/= 41/80 on the LEFS    to demonstrate a Minimally Clinically Importance Difference (MCID) in ADL completion, home/community ambulation, and lifting/bending/squatting. Baseline:  Goal status: INITIAL  2.  Patient will complete >/= 275 feet on 2 minute walk test:  to demonstrate an improvement in ADL completion, stair negotiation, household/community ambulation, and self-care Baseline:  Goal status: INITIAL  3.  Patient will be independent with a comprehensive strengthening HEP  Baseline:  Goal status: INITIAL  4. Patient will be able to demonstrate left hip internal rotation active range of motion to 0-30 degrees to facilitate ADL completion, lifting, squatting. Baseline:   Goal status: INITIAL  --------------------------------------------------------------------------------------------- PLAN:  PT FREQUENCY: 1-2x/week  PT DURATION: 8 weeks  PLANNED INTERVENTIONS: 97110-Therapeutic exercises, 97530- Therapeutic activity, 97112- Neuromuscular re-education, 97535- Self Care, 28413- Manual therapy, [phone]- Gait training, Patient/Family education, Balance training, Stair training, Dry Needling, Joint mobilization, Joint manipulation, Spinal manipulation, Spinal mobilization, DME instructions, Cryotherapy, and Moist heat.  PLAN FOR NEXT SESSION: Progress lower extremity strengthening   9:28 AM, 12/08/23 Sundra Haddix Small Shermika Balthaser MPT [place] physical therapy [place] [phone]

## 2023-12-15 ENCOUNTER — Encounter (HOSPITAL_COMMUNITY): Payer: Self-pay

## 2023-12-15 ENCOUNTER — Ambulatory Visit (HOSPITAL_COMMUNITY): Payer: Medicare Other

## 2023-12-15 DIAGNOSIS — R262 Difficulty in walking, not elsewhere classified: Secondary | ICD-10-CM | POA: Diagnosis not present

## 2023-12-15 DIAGNOSIS — M25552 Pain in left hip: Secondary | ICD-10-CM

## 2023-12-15 DIAGNOSIS — M6281 Muscle weakness (generalized): Secondary | ICD-10-CM

## 2023-12-15 NOTE — Therapy (Signed)
OUTPATIENT PHYSICAL THERAPY TREATMENT   Patient Name: Paula Butler MRN: [id number] DOB:Oct 29, 1971, 52 y.o., female Today's Date: 12/15/2023  END OF SESSION:   PT End of Session - 12/15/23 1150     Visit Number 4    Number of Visits 8    Date for PT Re-Evaluation 01/05/24    Authorization Type Medicare Part A & B    Authorization Time Period no auth; no limit    Progress Note Due on Visit 10    PT Start Time 1145    PT Stop Time 1225    PT Time Calculation (min) 40 min    Activity Tolerance Patient tolerated treatment well    Behavior During Therapy WFL for tasks assessed/performed              Past Medical History:  Diagnosis Date   Diabetes mellitus without complication (HCC)    Hyperlipidemia    Hypertension    Past Surgical History:  Procedure Laterality Date   CHOLECYSTECTOMY     ESOPHAGOGASTRODUODENOSCOPY  06/14/2009   Dr. Otho Najjar; mild esophagitis biopsied, small gastric ulcer-healing s/p biopsied, gastritis biopsied, mild duodenitis biopsied.  Esophageal biopsy unremarkable, gastric and duodenal biopsy with no significant histopathologic features.   EXCISION OF BREAST BIOPSY Left 05/29/2018   fibrocystic changes retroareolar excisional bx   TUBAL LIGATION     Patient Active Problem List   Diagnosis Date Noted   Depression, major, single episode, moderate (HCC) 12/27/2022   Grief reaction with prolonged bereavement 12/27/2022   Vitamin D deficiency 07/23/2022   Thrombocytopenia (HCC) 07/09/2022   Diabetes mellitus (HCC) 06/04/2022   Hyperlipidemia associated with type 2 diabetes mellitus (HCC) 06/04/2022   Hypertension associated with diabetes (HCC) 06/04/2022   Morbid obesity (HCC) 06/04/2022   Hepatic steatosis 04/07/2018    PCP: Sonny Masters, [facility] Provider (PCP)   REFERRING PROVIDER:   Sonny Masters, FNP    REFERRING DIAG:  Diagnosis  M79.605 (ICD-10-CM) - Anterior leg pain, left    Rationale for Evaluation and Treatment:  Rehabilitation  THERAPY DIAG:  Pain in left hip  Difficulty in walking, not elsewhere classified  Muscle weakness (generalized)  ONSET DATE: ~ 20month ago --------------------------------------------------------------------------------------------- SUBJECTIVE:                                                                                                                                                                                           SUBJECTIVE STATEMENT: Left hip pain is increased due to the holidays and more walking; 6/10 in hip today     Patient woke up ~ 1 month ago to use the restroom @ night and  couldn't move left leg for about 10 mins. Patient felt her left hip pop. Patient then went to MD ~2 weeks ago because the pain has persisted. Patient states the left hip doesn't get "stuck" anymore", just has pain   PERTINENT HISTORY:  DM  HTN  PAIN:  Are you having pain? Yes: NPRS scale: 4-5/10 Pain location: left hip/anterior  Pain description: sharp Aggravating factors: walking Relieving factors: rest  PRECAUTIONS: None  RED FLAGS: None   WEIGHT BEARING RESTRICTIONS: No  FALLS:  Has patient fallen in last 6 months? Yes. Number of falls 1; ~ 2months ago   LIVING ENVIRONMENT: Lives with: lives with their family and lives with their spouse Lives in: House/apartment Stairs: Yes: Internal: 10 steps; none Has following equipment at home: None  OCCUPATION: not working; on disability   PLOF: Independent  PATIENT GOALS: to walk pain free   NEXT MD VISIT: December 2024 --------------------------------------------------------------------------------------------- OBJECTIVE:   DIAGNOSTIC FINDINGS:  Not performed   PATIENT SURVEYS:  LEFS 25/80  SCREENING FOR RED FLAGS: Bowel or bladder incontinence: No Spinal tumors: No Cauda equina syndrome: No Compression fracture: No Abdominal aneurysm: No  COGNITION: Overall cognitive status: Within functional  limits for tasks assessed  POSTURE: No Significant postural limitations      FUNCTIONAL TESTS:  2 minute walk test: next session    GAIT ANALYSIS: Distance walked: 25 Assistive device utilized: None Level of assistance: Complete Independence Comments: Patient with MOD antalgia on left lower extremity; decreased left knee flexion in stance   SENSATION: WFL    LOWER EXTREMITY MMT:    MMT Right eval Left eval  Hip flexion 3+/5 3+/5  Hip extension    Hip abduction    Hip adduction    Hip internal rotation  3-/5  Hip external rotation  3/5  Knee flexion  3+/5  Knee extension  3+/5  Ankle dorsiflexion    Ankle plantarflexion    Ankle inversion    Ankle eversion     (Blank rows = not tested)  LOWER EXTREMITY ROM:     Passive  Right eval Left eval  Hip flexion  0-90 pain  Hip extension    Hip abduction    Hip adduction    Hip internal rotation  0-20 pain  Hip external rotation  [facility]  Knee flexion    Knee extension  (5)  Ankle dorsiflexion    Ankle plantarflexion    Ankle inversion    Ankle eversion     (Blank rows = not tested)  LOWER EXTREMITY SPECIAL TESTS  Hip scouring test: positive  and Anterior hip impingement test: positive    (FABER > FADIR > SCOUR > Tr. Sign > LLD)   PALPATION: Moderate tenderness to palpation left hip bursae INTEGUMENTARY   C/D/I --------------------------------------------------------------------------------------------- TODAY'S TREATMENT:  DATE:   12/15/23 Supine   Manual therapy to left hip including: PROM, gentle stretching into all planes, hip flexor stretch, soft tissue mobilization to decrease pain  Hip flexor with G theraband 2x10  Clamshells with G Tb 2x10   12/08/23 Nustep seat 6 x 5' dynamic warm up  Supine: Manual hip distraction 2 x 10; 5"hold each Hooklying clam with Green  theraband 2 x 10 Hooklying march with green theraband 2 x 10 Bridge 2 x 10 Hip adduction 5" hold 2 x 10 with ball Glute sets 5" hold 2 x 10  Sit to stand from low mat table 2 x 10    11/27/23 Supine  Manual therapy to left hip: short arc distraction with [facility], mobilization with movement into flexion   Hip marches with G theraband, bilateral 2x10  Partial SLRs off 1/2 foam, bilateral 2x10   11/10/23 PT Initial Eval Sidelying  Clamshells with right theraband 2x10  Reverse clamshells with right theraband 2x10   PATIENT EDUCATION:  Education details: HEP, activity modification  Person educated: Patient Education method: Explanation Education comprehension: verbalized understanding  HOME EXERCISE PROGRAM: Access Code: [email or web address] URL: [email or web address] Date: 11/27/2023 Prepared by: Seymour Bars  Exercises - Supine Hip Flexion with Resistance Loop  - 1-2 x daily - 2 sets - 10 reps - Small Range Straight Leg Raise  - 1-2 x daily - 2 sets - 10 reps - Clamshell with Resistance  - 1-2 x daily - 2 sets - 10 reps - Sidelying Reverse Clamshell with Resistance  - 1-2 x daily - 2 sets - 10 reps --------------------------------------------------------------------------------------------- ASSESSMENT:  CLINICAL IMPRESSION: Continued to work on left hip mobility and strength. Pain described as a "pinch". PT focused with manual therapy followed by theraband exercise with moderate relief of pain.  Patient will continue to benefit from PT to return to prior level of function    Patient is a 51 y.o. y.o. female who was seen today for physical therapy evaluation and treatment for left hip pain, difficulty walking, weakness. Patient presents to PT with the following objective impairments: Abnormal gait, decreased activity tolerance, decreased endurance, difficulty walking, decreased ROM, decreased strength, improper body mechanics, postural dysfunction, obesity, and  pain. These impairments limit the patient in activities such as carrying, lifting, bending, standing, squatting, stairs, locomotion level, and caring for others. These impairments also limit the patient in participation such as meal prep, cleaning, laundry, personal finances, interpersonal relationship, driving, shopping, and yard work. The patient will benefit from PT to address the limitations/impairments listed below to return to their prior level of function in the domains of activity and participation.    PERSONAL FACTORS:  n/a  are also affecting patient's functional outcome.   REHAB POTENTIAL: Good  CLINICAL DECISION MAKING: Stable/uncomplicated  EVALUATION COMPLEXITY: Low  --------------------------------------------------------------------------------------------- GOALS: Goals reviewed with patient? No  SHORT TERM GOALS: Target date: 12/08/2023    Patient will score a >/= 33/80  on the LEFS    to demonstrate a Minimally Clinically Importance Difference (MCID) in ADL completion, home/community ambulation, and lifting/bending/squatting.  Baseline: Goal status: INITIAL   2. Patient will be independent with a basic stretching/strengthening HEP  Baseline:  Goal status: INITIAL   LONG TERM GOALS: Target date: 01/05/2024   Patient will score a >/= 41/80 on the LEFS    to demonstrate a Minimally Clinically Importance Difference (MCID) in ADL completion, home/community ambulation, and lifting/bending/squatting. Baseline:  Goal status: INITIAL  2.  Patient will complete >/= 275 feet on  2 minute walk test:    to demonstrate an improvement in ADL completion, stair negotiation, household/community ambulation, and self-care Baseline:  Goal status: INITIAL  3.  Patient will be independent with a comprehensive strengthening HEP  Baseline:  Goal status: INITIAL  4. Patient will be able to demonstrate left hip internal rotation active range of motion to 0-30 degrees to facilitate  ADL completion, lifting, squatting. Baseline:  Goal status: INITIAL  --------------------------------------------------------------------------------------------- PLAN:  PT FREQUENCY: 1-2x/week  PT DURATION: 8 weeks  PLANNED INTERVENTIONS: 97110-Therapeutic exercises, 97530- Therapeutic activity, 97112- Neuromuscular re-education, 97535- Self Care, 16109- Manual therapy, [phone]- Gait training, Patient/Family education, Balance training, Stair training, Dry Needling, Joint mobilization, Joint manipulation, Spinal manipulation, Spinal mobilization, DME instructions, Cryotherapy, and Moist heat.  PLAN FOR NEXT SESSION: Progress lower extremity strengthening   4:04 PM, 12/15/23 Amy Small Lynch MPT [place] physical therapy [place] [phone]

## 2023-12-23 ENCOUNTER — Ambulatory Visit (HOSPITAL_COMMUNITY): Payer: Medicare Other | Attending: Family Medicine

## 2023-12-23 DIAGNOSIS — M25552 Pain in left hip: Secondary | ICD-10-CM

## 2023-12-23 DIAGNOSIS — R262 Difficulty in walking, not elsewhere classified: Secondary | ICD-10-CM | POA: Diagnosis not present

## 2023-12-23 DIAGNOSIS — M6281 Muscle weakness (generalized): Secondary | ICD-10-CM

## 2023-12-23 NOTE — Therapy (Signed)
 OUTPATIENT PHYSICAL THERAPY TREATMENT   Patient Name: KHADIJAH MASTRIANNI MRN: [id number] DOB:[birth year], 53 y.o., female Today's Date: 12/23/2023  END OF SESSION:   PT End of Session - 12/23/23 1152     Visit Number 5    Number of Visits 8    Date for PT Re-Evaluation 01/05/24    Authorization Type Medicare Part A & B    Authorization Time Period no auth; no limit    Progress Note Due on Visit 10    PT Start Time 1149    PT Stop Time 1229    PT Time Calculation (min) 40 min    Activity Tolerance Patient tolerated treatment well    Behavior During Therapy WFL for tasks assessed/performed              Past Medical History:  Diagnosis Date   Diabetes mellitus without complication (HCC)    Hyperlipidemia    Hypertension    Past Surgical History:  Procedure Laterality Date   CHOLECYSTECTOMY     ESOPHAGOGASTRODUODENOSCOPY  06/14/2009   Dr. Angelena; mild esophagitis biopsied, small gastric ulcer-healing s/p biopsied, gastritis biopsied, mild duodenitis biopsied.  Esophageal biopsy unremarkable, gastric and duodenal biopsy with no significant histopathologic features.   EXCISION OF BREAST BIOPSY Left 05/29/2018   fibrocystic changes retroareolar excisional bx   TUBAL LIGATION     Patient Active Problem List   Diagnosis Date Noted   Depression, major, single episode, moderate (HCC) 12/27/2022   Grief reaction with prolonged bereavement 12/27/2022   Vitamin D deficiency 07/23/2022   Thrombocytopenia (HCC) 07/09/2022   Diabetes mellitus (HCC) 06/04/2022   Hyperlipidemia associated with type 2 diabetes mellitus (HCC) 06/04/2022   Hypertension associated with diabetes (HCC) 06/04/2022   Morbid obesity (HCC) 06/04/2022   Hepatic steatosis 04/07/2018    PCP: Severa Rock HERO, [facility] Provider (PCP)   REFERRING PROVIDER:   Severa Rock HERO, FNP    REFERRING DIAG:  Diagnosis  M79.605 (ICD-10-CM) - Anterior leg pain, left    Rationale for Evaluation and Treatment:  Rehabilitation  THERAPY DIAG:  Pain in left hip  Difficulty in walking, not elsewhere classified  Muscle weakness (generalized)  ONSET DATE: ~ 24month ago --------------------------------------------------------------------------------------------- SUBJECTIVE:                                                                                                                                                                                           SUBJECTIVE STATEMENT: Today: Patient states she is no longer feeling a pinch in the left hip     Patient woke up ~ 1 month ago to use the restroom @ night and couldn't move  left leg for about 10 mins. Patient felt her left hip pop. Patient then went to MD ~2 weeks ago because the pain has persisted. Patient states the left hip doesn't get stuck anymore, just has pain   PERTINENT HISTORY:  DM  HTN  PAIN:  Are you having pain? Yes: NPRS scale: 4-5/10 Pain location: left hip/anterior  Pain description: sharp Aggravating factors: walking Relieving factors: rest  PRECAUTIONS: None  RED FLAGS: None   WEIGHT BEARING RESTRICTIONS: No  FALLS:  Has patient fallen in last 6 months? Yes. Number of falls 1; ~ 2months ago   LIVING ENVIRONMENT: Lives with: lives with their family and lives with their spouse Lives in: House/apartment Stairs: Yes: Internal: 10 steps; none Has following equipment at home: None  OCCUPATION: not working; on disability   PLOF: Independent  PATIENT GOALS: to walk pain free   NEXT MD VISIT: December 2024 --------------------------------------------------------------------------------------------- OBJECTIVE:   DIAGNOSTIC FINDINGS:  Not performed   PATIENT SURVEYS:  LEFS 25/80  SCREENING FOR RED FLAGS: Bowel or bladder incontinence: No Spinal tumors: No Cauda equina syndrome: No Compression fracture: No Abdominal aneurysm: No  COGNITION: Overall cognitive status: Within functional limits for  tasks assessed  POSTURE: No Significant postural limitations      FUNCTIONAL TESTS:  2 minute walk test: next session    GAIT ANALYSIS: Distance walked: 25 Assistive device utilized: None Level of assistance: Complete Independence Comments: Patient with MOD antalgia on left lower extremity; decreased left knee flexion in stance   SENSATION: WFL    LOWER EXTREMITY MMT:    MMT Right eval Left eval  Hip flexion 3+/5 3+/5  Hip extension    Hip abduction    Hip adduction    Hip internal rotation  3-/5  Hip external rotation  3/5  Knee flexion  3+/5  Knee extension  3+/5  Ankle dorsiflexion    Ankle plantarflexion    Ankle inversion    Ankle eversion     (Blank rows = not tested)  LOWER EXTREMITY ROM:     Passive  Right eval Left eval  Hip flexion  0-90 pain  Hip extension    Hip abduction    Hip adduction    Hip internal rotation  0-20 pain  Hip external rotation  [facility]  Knee flexion    Knee extension  (5)  Ankle dorsiflexion    Ankle plantarflexion    Ankle inversion    Ankle eversion     (Blank rows = not tested)  LOWER EXTREMITY SPECIAL TESTS  Hip scouring test: positive  and Anterior hip impingement test: positive    (FABER > FADIR > SCOUR > Tr. Sign > LLD)   PALPATION: Moderate tenderness to palpation left hip bursae INTEGUMENTARY   C/D/I --------------------------------------------------------------------------------------------- TODAY'S TREATMENT:  DATE:    12/23/23 Supine  Manual therapy to left hip: PROM, LAD  SLRs with 5# x10 Sidelying  Left Hip abduction with 5# x10  Left hip adduction with 5# - NT Standing  Left hip abduction, extension in // bars with 5# x10 each  12/15/23 Supine   Manual therapy to left hip including: PROM, gentle stretching into all planes, hip flexor stretch, soft tissue mobilization to  decrease pain  Hip flexor with G theraband 2x10  Clamshells with G Tb 2x10   12/08/23 Nustep seat 6 x 5' dynamic warm up  Supine: Manual hip distraction 2 x 10; 5hold each Hooklying clam with Green theraband 2 x 10 Hooklying march with green theraband 2 x 10 Bridge 2 x 10 Hip adduction 5 hold 2 x 10 with ball Glute sets 5 hold 2 x 10  Sit to stand from low mat table 2 x 10    11/27/23 Supine  Manual therapy to left hip: short arc distraction with [facility], mobilization with movement into flexion   Hip marches with G theraband, bilateral 2x10  Partial SLRs off 1/2 foam, bilateral 2x10   11/10/23 PT Initial Eval Sidelying  Clamshells with right theraband 2x10  Reverse clamshells with right theraband 2x10   PATIENT EDUCATION:  Education details: HEP, activity modification  Person educated: Patient Education method: Explanation Education comprehension: verbalized understanding  HOME EXERCISE PROGRAM: Access Code: [email or web address] URL: [email or web address] Date: 11/27/2023 Prepared by: Deward Ming  Exercises - Supine Hip Flexion with Resistance Loop  - 1-2 x daily - 2 sets - 10 reps - Small Range Straight Leg Raise  - 1-2 x daily - 2 sets - 10 reps - Clamshell with Resistance  - 1-2 x daily - 2 sets - 10 reps - Sidelying Reverse Clamshell with Resistance  - 1-2 x daily - 2 sets - 10 reps --------------------------------------------------------------------------------------------- ASSESSMENT:  CLINICAL IMPRESSION: PT Continued to work on left hip strength. Patient no longer reports left hip pinching into Flexion so PT was able to progress patient with lower extremity strengthening using a 5# ankle wight which pt has at home. Patient able to tolerate all new therapeutic exercise well with minimal fatigue and no pain. Patient will continue to benefit from PT to return to prior level of function    Patient is a 53 y.o. y.o. female who was seen today  for physical therapy evaluation and treatment for left hip pain, difficulty walking, weakness. Patient presents to PT with the following objective impairments: Abnormal gait, decreased activity tolerance, decreased endurance, difficulty walking, decreased ROM, decreased strength, improper body mechanics, postural dysfunction, obesity, and pain. These impairments limit the patient in activities such as carrying, lifting, bending, standing, squatting, stairs, locomotion level, and caring for others. These impairments also limit the patient in participation such as meal prep, cleaning, laundry, personal finances, interpersonal relationship, driving, shopping, and yard work. The patient will benefit from PT to address the limitations/impairments listed below to return to their prior level of function in the domains of activity and participation.    PERSONAL FACTORS:  n/a  are also affecting patient's functional outcome.   REHAB POTENTIAL: Good  CLINICAL DECISION MAKING: Stable/uncomplicated  EVALUATION COMPLEXITY: Low  --------------------------------------------------------------------------------------------- GOALS: Goals reviewed with patient? No  SHORT TERM GOALS: Target date: 12/08/2023    Patient will score a >/= 33/80  on the LEFS    to demonstrate a Minimally Clinically Importance Difference (MCID) in ADL completion, home/community ambulation, and lifting/bending/squatting.  Baseline: Goal status: INITIAL  2. Patient will be independent with a basic stretching/strengthening HEP  Baseline:  Goal status: INITIAL   LONG TERM GOALS: Target date: 01/05/2024   Patient will score a >/= 41/80 on the LEFS    to demonstrate a Minimally Clinically Importance Difference (MCID) in ADL completion, home/community ambulation, and lifting/bending/squatting. Baseline:  Goal status: INITIAL  2.  Patient will complete >/= 275 feet on 2 minute walk test:    to demonstrate an improvement in ADL  completion, stair negotiation, household/community ambulation, and self-care Baseline:  Goal status: INITIAL  3.  Patient will be independent with a comprehensive strengthening HEP  Baseline:  Goal status: INITIAL  4. Patient will be able to demonstrate left hip internal rotation active range of motion to 0-30 degrees to facilitate ADL completion, lifting, squatting. Baseline:  Goal status: INITIAL  --------------------------------------------------------------------------------------------- PLAN:  PT FREQUENCY: 1-2x/week  PT DURATION: 8 weeks  PLANNED INTERVENTIONS: 97110-Therapeutic exercises, 97530- Therapeutic activity, 97112- Neuromuscular re-education, 97535- Self Care, [id number]- Manual therapy, [phone]- Gait training, Patient/Family education, Balance training, Stair training, Dry Needling, Joint mobilization, Joint manipulation, Spinal manipulation, Spinal mobilization, DME instructions, Cryotherapy, and Moist heat.  PLAN FOR NEXT SESSION: Progress lower extremity strengthening   Deward Ming PT, DPT

## 2023-12-30 ENCOUNTER — Encounter (HOSPITAL_COMMUNITY): Payer: Medicare Other

## 2024-01-06 ENCOUNTER — Encounter (HOSPITAL_COMMUNITY): Payer: Medicare Other

## 2024-01-13 ENCOUNTER — Encounter (HOSPITAL_COMMUNITY): Payer: Medicare Other

## 2024-01-23 ENCOUNTER — Inpatient Hospital Stay: Payer: Medicare Other

## 2024-01-23 ENCOUNTER — Inpatient Hospital Stay: Payer: Medicare Other | Admitting: Oncology

## 2024-02-04 ENCOUNTER — Other Ambulatory Visit: Payer: Self-pay | Admitting: Family Medicine

## 2024-02-04 DIAGNOSIS — E1169 Type 2 diabetes mellitus with other specified complication: Secondary | ICD-10-CM

## 2024-02-18 ENCOUNTER — Encounter: Payer: Self-pay | Admitting: Family Medicine

## 2024-02-18 ENCOUNTER — Ambulatory Visit: Payer: Medicare Other | Admitting: Family Medicine

## 2024-02-18 DIAGNOSIS — R197 Diarrhea, unspecified: Secondary | ICD-10-CM | POA: Diagnosis not present

## 2024-02-18 DIAGNOSIS — R1013 Epigastric pain: Secondary | ICD-10-CM | POA: Diagnosis not present

## 2024-02-18 DIAGNOSIS — R112 Nausea with vomiting, unspecified: Secondary | ICD-10-CM | POA: Diagnosis not present

## 2024-02-18 DIAGNOSIS — R1032 Left lower quadrant pain: Secondary | ICD-10-CM | POA: Diagnosis not present

## 2024-02-18 DIAGNOSIS — R109 Unspecified abdominal pain: Secondary | ICD-10-CM | POA: Diagnosis not present

## 2024-02-18 DIAGNOSIS — R079 Chest pain, unspecified: Secondary | ICD-10-CM | POA: Diagnosis not present

## 2024-02-18 DIAGNOSIS — I10 Essential (primary) hypertension: Secondary | ICD-10-CM | POA: Diagnosis not present

## 2024-02-18 DIAGNOSIS — E119 Type 2 diabetes mellitus without complications: Secondary | ICD-10-CM | POA: Diagnosis not present

## 2024-02-19 ENCOUNTER — Encounter: Payer: Self-pay | Admitting: Family Medicine

## 2024-02-19 ENCOUNTER — Ambulatory Visit: Admitting: Family Medicine

## 2024-02-23 ENCOUNTER — Other Ambulatory Visit: Payer: Self-pay

## 2024-02-23 DIAGNOSIS — D696 Thrombocytopenia, unspecified: Secondary | ICD-10-CM

## 2024-02-23 NOTE — Progress Notes (Unsigned)
 [facility] [address], [place] [address]   CLINIC:  Medical Oncology/Hematology  PCP:  Sonny Masters, FNP [address] [place] [address] [phone]   REASON FOR VISIT:  Follow-up for mild thrombocytopenia   PRIOR THERAPY: None   CURRENT THERAPY: Surveillance  INTERVAL HISTORY:   Paula Butler 53 y.o. female returns for routine follow-up of mild thrombocytopenia.  She was last seen by Rojelio Brenner PA-C on 01/24/2023.  At today's visit, she reports feeling fairly well.  She had Norovirus last week, but feeling better now.  She has had some increased stress, as her husband was recently hospitalized for heart attack, but is set to be released today.  She reports easy bruising but denies any petechial rash or spontaneous purpura.  No epistaxis, gum bleeding, hematemesis, hematochezia, melena, or hematuria.  She reports hot flashes night sweats secondary to menopause.  She denies any unexplained fevers, chills, or weight loss.  She has 75% energy and 100% appetite. She has lost about 20 pounds in the past year through diet, exercise, and Ozempic.  ASSESSMENT & PLAN:  1.  Mild thrombocytopenia - Patient evaluated for mild thrombocytopenia since 2019. - She reports nosebleeds 1-2 times per week only in the hot weather since 2017. - She also reports bruising on extremities. - Denies any bleeding per rectum or melena.  She does not use any quinine supplements.  She attained menopause 2 years ago.  Prior to that she used to have heavy menses all her life. - Ultrasound of the abdomen in 2019 shows fatty liver.  Splenic ultrasound (07/17/2022) shows spleen measuring 8.4 x 6.1 x 12.6 cm with splenic volume 335 cc. - Hematology workup (07/09/2022): Hepatitis B and hepatitis C negative.  H. pylori IgA and IgG antibodies positive. Normal B12, folate, methylmalonic acid, copper Normal reticulocytes, LDH.  Negative ANA.  Rheumatoid factor weakly positive at  16.2. Von Willebrand panel negative. - CBC today (02/24/2024): Platelets 130.  CMP and LDH unremarkable. - Differential diagnosis favors mild thrombocytopenia in the setting of fatty liver disease.  Differential also includes immune mediated thrombocytopenia. - PLAN: No indication for treatment of thrombocytopenia at this time.    2.  Social/family history: - She is on disability.  Used to work at a [employer].  Non-smoker. - Sister had acute leukemia.  Another sister died of breast cancer.  Brother had pancreatic cancer at age 38.  Mother has rheumatoid arthritis.  Half brother had cancer in the liver.  Father died of stomach cancer.    PLAN SUMMARY:  >> Labs only (CBC) in 6 months >> Same-day labs (CBC/D, CMP, LDH) + OFFICE visit in 1 year     REVIEW OF SYSTEMS:   Review of Systems  Constitutional:  Negative for appetite change, chills, diaphoresis, fatigue, fever and unexpected weight change.  HENT:   Negative for lump/mass and nosebleeds.   Eyes:  Negative for eye problems.  Respiratory:  Negative for cough, hemoptysis and shortness of breath.   Cardiovascular:  Negative for chest pain, leg swelling and palpitations.  Gastrointestinal:  Positive for diarrhea, nausea and vomiting. Negative for abdominal pain, blood in stool and constipation.  Genitourinary:  Negative for hematuria.   Skin: Negative.   Neurological:  Negative for dizziness, headaches, light-headedness and numbness.  Hematological:  Does not bruise/bleed easily.  Psychiatric/Behavioral:  Negative for depression. The patient is not nervous/anxious.      PHYSICAL EXAM:  ECOG PERFORMANCE STATUS: 0 - Asymptomatic  Vitals:   02/24/24 0846  BP: 128/84  Pulse: 70  Resp: 16  Temp: 97.7 F (36.5 C)  SpO2: 97%   Filed Weights   02/24/24 0846  Weight: 249 lb 9 oz (113.2 kg)   Physical Exam Constitutional:      Appearance: Normal appearance. She is morbidly obese.  Cardiovascular:     Heart sounds: Normal  heart sounds.  Pulmonary:     Breath sounds: Normal breath sounds.  Neurological:     General: No focal deficit present.     Mental Status: Mental status is at baseline.  Psychiatric:        Behavior: Behavior normal. Behavior is cooperative.     PAST MEDICAL/SURGICAL HISTORY:  Past Medical History:  Diagnosis Date   Diabetes mellitus without complication (HCC)    Hyperlipidemia    Hypertension    Past Surgical History:  Procedure Laterality Date   CHOLECYSTECTOMY     ESOPHAGOGASTRODUODENOSCOPY  06/14/2009   Dr. Otho Najjar; mild esophagitis biopsied, small gastric ulcer-healing s/p biopsied, gastritis biopsied, mild duodenitis biopsied.  Esophageal biopsy unremarkable, gastric and duodenal biopsy with no significant histopathologic features.   EXCISION OF BREAST BIOPSY Left 05/29/2018   fibrocystic changes retroareolar excisional bx   TUBAL LIGATION      SOCIAL HISTORY:  Social History   Socioeconomic History   Marital status: Married    Spouse name: Jose   Number of children: 3   Years of education: Not on file   Highest education level: Not on file  Occupational History   Occupation: disability  Tobacco Use   Smoking status: Never   Smokeless tobacco: Never  Vaping Use   Vaping status: Never Used  Substance and Sexual Activity   Alcohol use: No   Drug use: No   Sexual activity: Not Currently    Partners: Male    Birth control/protection: Surgical    Comment: tubal  Other Topics Concern   Not on file  Social History Narrative   Married for 10 years, 3 children, all grown/teenagers.    2 daughters, 1 son - children live with her   Attends church.    Walks for exercise.   Enjoys singing.   Eats all food groups.   Wears seatbelt.   Born in [place], grew up in [place]. Father in [employer]. Grandmother raised her.    Moved from [place] in 2018.    Does not work out side of home.    Social Drivers of Corporate investment banker Strain: Low Risk  (11/04/2023)    Overall Financial Resource Strain (CARDIA)    Difficulty of Paying Living Expenses: Not hard at all  Food Insecurity: No Food Insecurity (11/04/2023)   Hunger Vital Sign    Worried About Running Out of Food in the Last Year: Never true    Ran Out of Food in the Last Year: Never true  Transportation Needs: No Transportation Needs (11/04/2023)   PRAPARE - Administrator, Civil Service (Medical): No    Lack of Transportation (Non-Medical): No  Physical Activity: Sufficiently Active (11/04/2023)   Exercise Vital Sign    Days of Exercise per Week: 4 days    Minutes of Exercise per Session: 40 min  Stress: No Stress Concern Present (11/04/2023)   [employer] of Occupational Health - Occupational Stress Questionnaire    Feeling of Stress : Not at all  Social Connections: Moderately Isolated (11/04/2023)   Social Connection and Isolation Panel [NHANES]    Frequency  of Communication with Friends and Family: More than three times a week    Frequency of Social Gatherings with Friends and Family: More than three times a week    Attends Religious Services: Never    Database administrator or Organizations: No    Attends Banker Meetings: Never    Marital Status: Married  Catering manager Violence: Not At Risk (11/04/2023)   Humiliation, Afraid, Rape, and Kick questionnaire    Fear of Current or Ex-Partner: No    Emotionally Abused: No    Physically Abused: No    Sexually Abused: No    FAMILY HISTORY:  Family History  Problem Relation Age of Onset   Diabetes Mother    Hypothyroidism Mother    Hyperlipidemia Mother    Hyperlipidemia Father    Hypertension Father    Diabetes Father    Prostate cancer Father    Breast cancer Sister    Stroke Brother    Pancreatic cancer Brother     CURRENT MEDICATIONS:  Outpatient Encounter Medications as of 02/24/2024  Medication Sig   atorvastatin (LIPITOR) 20 MG tablet Take 1 tablet (20 mg total) by mouth daily.    Blood Glucose Monitoring Suppl DEVI 1 each by Does not apply route in the morning, at noon, and at bedtime. May substitute to any manufacturer covered by patient's insurance.   Dulaglutide (TRULICITY) 3 MG/0.5ML SOAJ INJECT 3 MG UNDER THE SKIN ONCE EVERY WEEK AS DIRECTED   lisinopril (ZESTRIL) 20 MG tablet Take 1 tablet (20 mg total) by mouth daily.   sertraline (ZOLOFT) 50 MG tablet Take 2 tablets (100 mg total) by mouth daily.   No facility-administered encounter medications on file as of 02/24/2024.    ALLERGIES:  Allergies  Allergen Reactions   Fish Allergy Anaphylaxis, Shortness Of Breath and Rash   Shrimp [Shellfish Allergy] Anaphylaxis, Shortness Of Breath and Rash   Asa [Aspirin] Other (See Comments)    Abdominal swelling    Morpholine Salicylate     Other Reaction(s): Other (See Comments)  "Abdominal bloating, extremely painful"   Penicillins Hives, Itching and Rash    Has patient had a PCN reaction causing immediate rash, facial/tongue/throat swelling, SOB or lightheadedness with hypotension: Yes Has patient had a PCN reaction causing severe rash involving mucus membranes or skin necrosis: No Has patient had a PCN reaction that required hospitalization: Yes Has patient had a PCN reaction occurring within the last 10 years: Yes If all of the above answers are "NO", then may proceed with Cephalosporin use.     LABORATORY DATA:  I have reviewed the labs as listed.  CBC    Component Value Date/Time   WBC 4.6 02/24/2024 0808   RBC 4.63 02/24/2024 0808   HGB 14.2 02/24/2024 0808   HGB 14.0 04/01/2023 1041   HCT 41.7 02/24/2024 0808   HCT 41.5 04/01/2023 1041   PLT 130 (L) 02/24/2024 0808   PLT 137 (L) 04/01/2023 1041   MCV 90.1 02/24/2024 0808   MCV 89 04/01/2023 1041   MCH 30.7 02/24/2024 0808   MCHC 34.1 02/24/2024 0808   RDW 13.7 02/24/2024 0808   RDW 13.1 04/01/2023 1041   LYMPHSABS 1.8 02/24/2024 0808   LYMPHSABS 1.7 04/01/2023 1041   MONOABS 0.4 02/24/2024  0808   EOSABS 0.2 02/24/2024 0808   EOSABS 0.2 04/01/2023 1041   BASOSABS 0.0 02/24/2024 0808   BASOSABS 0.1 04/01/2023 1041      Latest Ref Rng & Units 02/24/2024  8:08 AM 05/13/2023   12:43 PM 04/01/2023   10:41 AM  CMP  Glucose 70 - 99 mg/dL 93  97  99   BUN 6 - 20 mg/dL 12  18  13    Creatinine 0.44 - 1.00 mg/dL 1.61  0.96  0.45   Sodium 135 - 145 mmol/L 141  143  144   Potassium 3.5 - 5.1 mmol/L 3.9  4.1  4.8   Chloride 98 - 111 mmol/L 105  107  104   CO2 22 - 32 mmol/L 27  20  23    Calcium 8.9 - 10.3 mg/dL 9.2  9.1  9.8   Total Protein 6.5 - 8.1 g/dL 7.1   6.8   Total Bilirubin 0.0 - 1.2 mg/dL 0.6   0.3   Alkaline Phos 38 - 126 U/L 79   91   AST 15 - 41 U/L 36   18   ALT 0 - 44 U/L 66   18     DIAGNOSTIC IMAGING:  I have independently reviewed the relevant imaging and discussed with the patient.   WRAP UP:  All questions were answered. The patient knows to call the clinic with any problems, questions or concerns.  Medical decision making: Low  Time spent on visit: I spent 15 minutes counseling the patient face to face. The total time spent in the appointment was 22 minutes and more than 50% was on counseling.  Carnella Guadalajara, PA-C  02/24/24 9:31 AM

## 2024-02-24 ENCOUNTER — Inpatient Hospital Stay: Payer: Medicare Other | Attending: Oncology

## 2024-02-24 ENCOUNTER — Inpatient Hospital Stay (HOSPITAL_BASED_OUTPATIENT_CLINIC_OR_DEPARTMENT_OTHER): Payer: Medicare Other | Admitting: Physician Assistant

## 2024-02-24 VITALS — BP 128/84 | HR 70 | Temp 97.7°F | Resp 16 | Wt 249.6 lb

## 2024-02-24 DIAGNOSIS — D696 Thrombocytopenia, unspecified: Secondary | ICD-10-CM | POA: Diagnosis not present

## 2024-02-24 DIAGNOSIS — Z8042 Family history of malignant neoplasm of prostate: Secondary | ICD-10-CM | POA: Diagnosis not present

## 2024-02-24 DIAGNOSIS — Z803 Family history of malignant neoplasm of breast: Secondary | ICD-10-CM | POA: Insufficient documentation

## 2024-02-24 DIAGNOSIS — Z800 Family history of malignant neoplasm of digestive organs: Secondary | ICD-10-CM | POA: Insufficient documentation

## 2024-02-24 DIAGNOSIS — Z806 Family history of leukemia: Secondary | ICD-10-CM | POA: Diagnosis not present

## 2024-02-24 LAB — COMPREHENSIVE METABOLIC PANEL
ALT: 66 U/L — ABNORMAL HIGH (ref 0–44)
AST: 36 U/L (ref 15–41)
Albumin: 3.6 g/dL (ref 3.5–5.0)
Alkaline Phosphatase: 79 U/L (ref 38–126)
Anion gap: 9 (ref 5–15)
BUN: 12 mg/dL (ref 6–20)
CO2: 27 mmol/L (ref 22–32)
Calcium: 9.2 mg/dL (ref 8.9–10.3)
Chloride: 105 mmol/L (ref 98–111)
Creatinine, Ser: 0.79 mg/dL (ref 0.44–1.00)
GFR, Estimated: >60 mL/min (ref >60)
Glucose, Bld: 93 mg/dL (ref 70–99)
Potassium: 3.9 mmol/L (ref 3.5–5.1)
Sodium: 141 mmol/L (ref 135–145)
Total Bilirubin: 0.6 mg/dL (ref 0.0–1.2)
Total Protein: 7.1 g/dL (ref 6.5–8.1)

## 2024-02-24 LAB — CBC WITH DIFFERENTIAL/PLATELET
Abs Immature Granulocytes: 0.01 10*3/uL (ref 0.00–0.07)
Basophils Absolute: 0 10*3/uL (ref 0.0–0.1)
Basophils Relative: 0 %
Eosinophils Absolute: 0.2 10*3/uL (ref 0.0–0.5)
Eosinophils Relative: 4 %
HCT: 41.7 % (ref 36.0–46.0)
Hemoglobin: 14.2 g/dL (ref 12.0–15.0)
Immature Granulocytes: 0 %
Lymphocytes Relative: 40 %
Lymphs Abs: 1.8 10*3/uL (ref 0.7–4.0)
MCH: 30.7 pg (ref 26.0–34.0)
MCHC: 34.1 g/dL (ref 30.0–36.0)
MCV: 90.1 fL (ref 80.0–100.0)
Monocytes Absolute: 0.4 10*3/uL (ref 0.1–1.0)
Monocytes Relative: 10 %
Neutro Abs: 2.1 10*3/uL (ref 1.7–7.7)
Neutrophils Relative %: 46 %
Platelets: 130 10*3/uL — ABNORMAL LOW (ref 150–400)
RBC: 4.63 MIL/uL (ref 3.87–5.11)
RDW: 13.7 % (ref 11.5–15.5)
WBC: 4.6 10*3/uL (ref 4.0–10.5)
nRBC: 0 % (ref 0.0–0.2)

## 2024-02-24 LAB — LACTATE DEHYDROGENASE: LDH: 171 U/L (ref 98–192)

## 2024-02-24 NOTE — Patient Instructions (Signed)
[facility] [place] **   You were seen today by Tarri Abernethy PA-C for your low platelets.    LOW PLATELETS Your low platelets are most likely related to underlying "fatty liver disease." You may also have some mild immune system dysfunction, meaning that your body will attack some of your platelets and cause them to be low. At this time, your platelets are only mildly low and do not require any specific treatment. We will continue to monitor your low platelets with repeat labs in 6 months and an office visit in 1 year.  FATTY LIVER DISEASE: This was seen on abdominal ultrasound and is when fatty tissue can infiltrate the liver.  This may be what is causing your platelets to be low.  The following instructions can help to decrease your fatty liver disease. Recommend 1-2# weight loss per week until ideal body weight through exercise & diet. Low fat/cholesterol diet.   Avoid sweets, sodas, fruit juices, sweetened beverages like tea, etc. Gradually increase exercise from 15 min daily up to 1 hr per day 5 days/week. Limit alcohol use.   FOLLOW-UP APPOINTMENT: Labs only in 6 months.  Same-day labs and office visit in 1 year.  ** Thank you for trusting me with your healthcare!  I strive to provide all of my patients with quality care at each visit.  If you receive a survey for this visit, I would be so grateful to you for taking the time to provide feedback.  Thank you in advance!  ~ Anorah Trias                   Dr. Derek Jack   &   Tarri Abernethy, PA-C   - - - - - - - - - - - - - - - - - -    Thank you for choosing [facility] to provide your oncology and hematology care.  To afford each patient quality time with our provider, please arrive at least 15 minutes before your scheduled appointment time.   If you have a lab appointment with the [facility] please come in thru  the Main Entrance and check in at the main information desk.  You need to re-schedule your appointment should you arrive 10 or more minutes late.  We strive to give you quality time with our providers, and arriving late affects you and other patients whose appointments are after yours.  Also, if you no show three or more times for appointments you may be dismissed from the clinic at the providers discretion.     Again, thank you for choosing [facility].  Our hope is that these requests will decrease the amount of time that you wait before being seen by our physicians.       _____________________________________________________________  Should you have questions after your visit to [facility], please contact our office at [phone] and follow the prompts.  Our office hours are 8:00 a.m. and 4:30 p.m. Monday - Friday.  Please note that voicemails left after 4:00 p.m. may not be returned until the following business day.  We are closed weekends and major holidays.  You do have access to a nurse 24-7, just call the main number to the clinic [phone] and do not press any options, hold on the line and a nurse will answer the phone.    For prescription refill requests,  have your pharmacy contact our office and allow 72 hours.

## 2024-02-25 ENCOUNTER — Encounter: Payer: Self-pay | Admitting: Family Medicine

## 2024-02-25 ENCOUNTER — Ambulatory Visit (INDEPENDENT_AMBULATORY_CARE_PROVIDER_SITE_OTHER): Admitting: Family Medicine

## 2024-02-25 VITALS — BP 141/91 | HR 69 | Temp 97.7°F | Ht 65.0 in | Wt 249.4 lb

## 2024-02-25 DIAGNOSIS — I152 Hypertension secondary to endocrine disorders: Secondary | ICD-10-CM

## 2024-02-25 DIAGNOSIS — F4329 Adjustment disorder with other symptoms: Secondary | ICD-10-CM | POA: Diagnosis not present

## 2024-02-25 DIAGNOSIS — K529 Noninfective gastroenteritis and colitis, unspecified: Secondary | ICD-10-CM

## 2024-02-25 DIAGNOSIS — E1169 Type 2 diabetes mellitus with other specified complication: Secondary | ICD-10-CM | POA: Diagnosis not present

## 2024-02-25 DIAGNOSIS — E785 Hyperlipidemia, unspecified: Secondary | ICD-10-CM | POA: Diagnosis not present

## 2024-02-25 DIAGNOSIS — F321 Major depressive disorder, single episode, moderate: Secondary | ICD-10-CM | POA: Diagnosis not present

## 2024-02-25 DIAGNOSIS — E1159 Type 2 diabetes mellitus with other circulatory complications: Secondary | ICD-10-CM | POA: Diagnosis not present

## 2024-02-25 DIAGNOSIS — Z6841 Body Mass Index (BMI) 40.0 and over, adult: Secondary | ICD-10-CM | POA: Diagnosis not present

## 2024-02-25 LAB — BAYER DCA HB A1C WAIVED: HB A1C (BAYER DCA - WAIVED): 5 % (ref 4.8–5.6)

## 2024-02-25 MED ORDER — ONDANSETRON HCL 4 MG PO TABS: 4.0000 mg | ORAL_TABLET | Freq: Three times a day (TID) | ORAL | 0 refills | Status: DC | PRN

## 2024-02-25 NOTE — Progress Notes (Signed)
 Subjective:  Patient ID: Paula Butler, female    DOB: 10-11-1971, 53 y.o.   MRN: [id number]  Patient Care Team: Sonny Masters, FNP as PCP - General (Family Medicine) Lanelle Bal, DO as Consulting Physician (Gastroenterology)   Chief Complaint:  Medical Management of Chronic Issues (3 month follow up of chronic conditions ) and Nausea (Started this morning )   HPI: Paula Butler is a 53 y.o. female presenting on 02/25/2024 for Medical Management of Chronic Issues (3 month follow up of chronic conditions ) and Nausea (Started this morning )   Discussed the use of AI scribe software for clinical note transcription with the patient, who gave verbal consent to proceed.  History of Present Illness   The patient presents for a follow-up on diabetes management.  Her blood sugar levels have been stable, ranging between 117 and 120 mg/dL. She is currently on Trulicity 3 mg with no significant side effects. Her A1c is 5.0, indicating excellent glycemic control.  She began experiencing nausea this morning around 8:00 AM, which has occurred three times today. She attributes this to a possible viral illness, as her husband is currently experiencing symptoms of a gastrointestinal virus, including nausea, diarrhea, and vomiting. She has not vomited and is trying to avoid close contact with her husband to prevent illness.  She recently traveled to [place] for her oldest daughter's wedding and experienced vomiting and diarrhea for two days upon return, which led her to visit the ER. She isolated during this time and has since recovered from those symptoms.  Her hypertension is managed with lisinopril 20 mg. No headaches, leg swelling, visual changes, or confusion, indicating stable blood pressure control.  She is on atorvastatin for cholesterol management and reports occasional muscle aches. She has been advised to adjust the frequency of the medication to every other night to alleviate  these symptoms.  She is on Zoloft for depression and anxiety, which she feels is effective, as she reports improved mood and self-care activities such as dressing up, applying makeup, and doing her nails. She mentions feeling like a 'different person' and notes weight loss, with her weight now at 248-249 pounds.  Her husband recently had a heart attack but is currently at home recovering. She describes an episode where he experienced excessive sweating and back pain, leading to an ambulance call and subsequent diagnosis of a heart attack.         02/25/2024   11:40 AM 11/18/2023   10:37 AM 11/04/2023    3:37 PM 08/13/2023   11:22 AM 07/29/2023   11:31 AM  Depression screen PHQ 2/9  Decreased Interest 0 1 0 1 0  Down, Depressed, Hopeless 1 2 0 0 0  PHQ - 2 Score 1 3 0 1 0  Altered sleeping 2 1  1 1   Tired, decreased energy 1 1  1 1   Change in appetite 1 1  0 0  Feeling bad or failure about yourself  0 0  0 0  Trouble concentrating 1 1  0 0  Moving slowly or fidgety/restless 0 0  0 0  Suicidal thoughts 0 0  0 0  PHQ-9 Score 6 7  3 2   Difficult doing work/chores Not difficult at all Somewhat difficult  Not difficult at all       02/25/2024   11:40 AM 11/18/2023   10:37 AM 08/13/2023   11:23 AM 07/29/2023   11:31 AM  GAD 7 : Generalized Anxiety  Score  Nervous, Anxious, on Edge 1  1 1   Control/stop worrying 1 1 1  0  Worry too much - different things 0 1 0 1  Trouble relaxing 1 1 0 1  Restless 0 1 0 0  Easily annoyed or irritable 2 2 2 1   Afraid - awful might happen 1 1 0 0  Total GAD 7 Score 6  4 4   Anxiety Difficulty Not difficult at all Somewhat difficult Not difficult at all         Relevant past medical, surgical, family, and social history reviewed and updated as indicated.  Allergies and medications reviewed and updated. Data reviewed: Chart in Epic.   Past Medical History:  Diagnosis Date   Diabetes mellitus without complication (HCC)    Hyperlipidemia     Hypertension     Past Surgical History:  Procedure Laterality Date   CHOLECYSTECTOMY     ESOPHAGOGASTRODUODENOSCOPY  06/14/2009   Dr. Otho Najjar; mild esophagitis biopsied, small gastric ulcer-healing s/p biopsied, gastritis biopsied, mild duodenitis biopsied.  Esophageal biopsy unremarkable, gastric and duodenal biopsy with no significant histopathologic features.   EXCISION OF BREAST BIOPSY Left 05/29/2018   fibrocystic changes retroareolar excisional bx   TUBAL LIGATION      Social History   Socioeconomic History   Marital status: Married    Spouse name: Jose   Number of children: 3   Years of education: Not on file   Highest education level: Not on file  Occupational History   Occupation: disability  Tobacco Use   Smoking status: Never   Smokeless tobacco: Never  Vaping Use   Vaping status: Never Used  Substance and Sexual Activity   Alcohol use: No   Drug use: No   Sexual activity: Not Currently    Partners: Male    Birth control/protection: Surgical    Comment: tubal  Other Topics Concern   Not on file  Social History Narrative   Married for 10 years, 3 children, all grown/teenagers.    2 daughters, 1 son - children live with her   Attends church.    Walks for exercise.   Enjoys singing.   Eats all food groups.   Wears seatbelt.   Born in [place], grew up in [place]. Father in [employer]. Grandmother raised her.    Moved from [place] in 2018.    Does not work out side of home.    Social Drivers of Corporate investment banker Strain: Low Risk  (11/04/2023)   Overall Financial Resource Strain (CARDIA)    Difficulty of Paying Living Expenses: Not hard at all  Food Insecurity: No Food Insecurity (11/04/2023)   Hunger Vital Sign    Worried About Running Out of Food in the Last Year: Never true    Ran Out of Food in the Last Year: Never true  Transportation Needs: No Transportation Needs (11/04/2023)   PRAPARE - Administrator, Civil Service (Medical):  No    Lack of Transportation (Non-Medical): No  Physical Activity: Sufficiently Active (11/04/2023)   Exercise Vital Sign    Days of Exercise per Week: 4 days    Minutes of Exercise per Session: 40 min  Stress: No Stress Concern Present (11/04/2023)   [employer] of Occupational Health - Occupational Stress Questionnaire    Feeling of Stress : Not at all  Social Connections: Moderately Isolated (11/04/2023)   Social Connection and Isolation Panel [NHANES]    Frequency of Communication with Friends and Family: More than  three times a week    Frequency of Social Gatherings with Friends and Family: More than three times a week    Attends Religious Services: Never    Database administrator or Organizations: No    Attends Banker Meetings: Never    Marital Status: Married  Catering manager Violence: Not At Risk (11/04/2023)   Humiliation, Afraid, Rape, and Kick questionnaire    Fear of Current or Ex-Partner: No    Emotionally Abused: No    Physically Abused: No    Sexually Abused: No    Outpatient Encounter Medications as of 02/25/2024  Medication Sig   atorvastatin (LIPITOR) 20 MG tablet Take 1 tablet (20 mg total) by mouth daily.   Blood Glucose Monitoring Suppl DEVI 1 each by Does not apply route in the morning, at noon, and at bedtime. May substitute to any manufacturer covered by patient's insurance.   Dulaglutide (TRULICITY) 3 MG/0.5ML SOAJ INJECT 3 MG UNDER THE SKIN ONCE EVERY WEEK AS DIRECTED   lisinopril (ZESTRIL) 20 MG tablet Take 1 tablet (20 mg total) by mouth daily.   ondansetron (ZOFRAN) 4 MG tablet Take 1 tablet (4 mg total) by mouth every 8 (eight) hours as needed for nausea or vomiting.   sertraline (ZOLOFT) 50 MG tablet Take 2 tablets (100 mg total) by mouth daily.   No facility-administered encounter medications on file as of 02/25/2024.    Allergies  Allergen Reactions   Fish Allergy Anaphylaxis, Shortness Of Breath and Rash   Shrimp [Shellfish  Allergy] Anaphylaxis, Shortness Of Breath and Rash   Asa [Aspirin] Other (See Comments)    Abdominal swelling    Morpholine Salicylate     Other Reaction(s): Other (See Comments)  "Abdominal bloating, extremely painful"   Penicillins Hives, Itching and Rash    Has patient had a PCN reaction causing immediate rash, facial/tongue/throat swelling, SOB or lightheadedness with hypotension: Yes Has patient had a PCN reaction causing severe rash involving mucus membranes or skin necrosis: No Has patient had a PCN reaction that required hospitalization: Yes Has patient had a PCN reaction occurring within the last 10 years: Yes If all of the above answers are "NO", then may proceed with Cephalosporin use.     Pertinent ROS per HPI, otherwise unremarkable      Objective:  BP (!) 141/91   Pulse 69   Temp 97.7 F (36.5 C)   Ht 5\' 5"  (1.651 m)   Wt 249 lb 6.4 oz (113.1 kg)   SpO2 97%   BMI 41.50 kg/m    Wt Readings from Last 3 Encounters:  02/25/24 249 lb 6.4 oz (113.1 kg)  02/24/24 249 lb 9 oz (113.2 kg)  11/18/23 257 lb 12.8 oz (116.9 kg)    Physical Exam Vitals and nursing note reviewed.  Constitutional:      General: She is not in acute distress.    Appearance: Normal appearance. She is well-developed and well-groomed. She is morbidly obese. She is not ill-appearing, toxic-appearing or diaphoretic.  HENT:     Head: Normocephalic and atraumatic.     Jaw: There is normal jaw occlusion.     Right Ear: Hearing normal.     Left Ear: Hearing normal.     Nose: Nose normal.     Mouth/Throat:     Lips: Pink.     Mouth: Mucous membranes are moist.     Pharynx: Oropharynx is clear. Uvula midline.  Eyes:     General: Lids are normal.  Extraocular Movements: Extraocular movements intact.     Conjunctiva/sclera: Conjunctivae normal.     Pupils: Pupils are equal, round, and reactive to light.  Neck:     Trachea: Trachea and phonation normal.  Cardiovascular:     Rate and  Rhythm: Normal rate and regular rhythm.     Chest Wall: PMI is not displaced.     Pulses: Normal pulses.          Dorsalis pedis pulses are 2+ on the right side and 2+ on the left side.       Posterior tibial pulses are 2+ on the right side and 2+ on the left side.     Heart sounds: Normal heart sounds. No murmur heard.    No friction rub. No gallop.  Pulmonary:     Effort: Pulmonary effort is normal. No respiratory distress.     Breath sounds: Normal breath sounds. No wheezing.  Abdominal:     General: Bowel sounds are normal.     Palpations: Abdomen is soft.  Musculoskeletal:        General: Normal range of motion.     Cervical back: Normal range of motion and neck supple.     Right lower leg: No edema.     Left lower leg: No edema.  Feet:     Right foot:     Protective Sensation: 10 sites tested.  10 sites sensed.     Skin integrity: Skin integrity normal.     Left foot:     Protective Sensation: 10 sites tested.  10 sites sensed.     Skin integrity: Skin integrity normal.  Skin:    General: Skin is warm and dry.     Capillary Refill: Capillary refill takes less than 2 seconds.     Coloration: Skin is not cyanotic, jaundiced or pale.     Findings: No rash.  Neurological:     General: No focal deficit present.     Mental Status: She is alert and oriented to person, place, and time.     Sensory: Sensation is intact.     Motor: Motor function is intact.     Coordination: Coordination is intact.     Gait: Gait is intact.     Deep Tendon Reflexes: Reflexes are normal and symmetric.  Psychiatric:        Attention and Perception: Attention and perception normal.        Mood and Affect: Mood and affect normal.        Speech: Speech normal.        Behavior: Behavior normal. Behavior is cooperative.        Thought Content: Thought content normal.        Cognition and Memory: Cognition and memory normal.        Judgment: Judgment normal.      Results for orders placed or  performed in visit on 02/24/24  Lactate dehydrogenase   Collection Time: 02/24/24  8:08 AM  Result Value Ref Range   LDH 171 98 - 192 U/L  Comprehensive metabolic panel   Collection Time: 02/24/24  8:08 AM  Result Value Ref Range   Sodium 141 135 - 145 mmol/L   Potassium 3.9 3.5 - 5.1 mmol/L   Chloride 105 98 - 111 mmol/L   CO2 27 22 - 32 mmol/L   Glucose, Bld 93 70 - 99 mg/dL   BUN 12 6 - 20 mg/dL   Creatinine, Ser 1.61 0.44 - 1.00 mg/dL  Calcium 9.2 8.9 - 10.3 mg/dL   Total Protein 7.1 6.5 - 8.1 g/dL   Albumin 3.6 3.5 - 5.0 g/dL   AST 36 15 - 41 U/L   ALT 66 (H) 0 - 44 U/L   Alkaline Phosphatase 79 38 - 126 U/L   Total Bilirubin 0.6 0.0 - 1.2 mg/dL   GFR, Estimated >45 >40 mL/min   Anion gap 9 5 - 15  CBC with Differential   Collection Time: 02/24/24  8:08 AM  Result Value Ref Range   WBC 4.6 4.0 - 10.5 K/uL   RBC 4.63 3.87 - 5.11 MIL/uL   Hemoglobin 14.2 12.0 - 15.0 g/dL   HCT 98.1 19.1 - 47.8 %   MCV 90.1 80.0 - 100.0 fL   MCH 30.7 26.0 - 34.0 pg   MCHC 34.1 30.0 - 36.0 g/dL   RDW 29.5 62.1 - 30.8 %   Platelets 130 (L) 150 - 400 K/uL   nRBC 0.0 0.0 - 0.2 %   Neutrophils Relative % 46 %   Neutro Abs 2.1 1.7 - 7.7 K/uL   Lymphocytes Relative 40 %   Lymphs Abs 1.8 0.7 - 4.0 K/uL   Monocytes Relative 10 %   Monocytes Absolute 0.4 0.1 - 1.0 K/uL   Eosinophils Relative 4 %   Eosinophils Absolute 0.2 0.0 - 0.5 K/uL   Basophils Relative 0 %   Basophils Absolute 0.0 0.0 - 0.1 K/uL   Immature Granulocytes 0 %   Abs Immature Granulocytes 0.01 0.00 - 0.07 K/uL       Pertinent labs & imaging results that were available during my care of the patient were reviewed by me and considered in my medical decision making.  Assessment & Plan:  Paula Butler was seen today for medical management of chronic issues and nausea.  Diagnoses and all orders for this visit:  Type 2 diabetes mellitus with other specified complication, without long-term current use of insulin (HCC) -      Lipid panel -     Thyroid Panel With TSH -     Bayer DCA Hb A1c Waived  Hyperlipidemia associated with type 2 diabetes mellitus (HCC) -     Lipid panel  Hypertension associated with diabetes (HCC) -     Thyroid Panel With TSH  Depression, major, single episode, moderate (HCC) -     Thyroid Panel With TSH  Grief reaction with prolonged bereavement -     Thyroid Panel With TSH  Morbid obesity with BMI of 40.0-44.9, adult (HCC) -     Lipid panel -     Thyroid Panel With TSH -     Bayer DCA Hb A1c Waived  Gastroenteritis -     ondansetron (ZOFRAN) 4 MG tablet; Take 1 tablet (4 mg total) by mouth every 8 (eight) hours as needed for nausea or vomiting.     Assessment and Plan    Nausea Intermittent nausea since this morning, likely related to viral gastroenteritis due to recent exposure to husband's gastrointestinal symptoms. No vomiting reported. - Prescribe Zofran for nausea, to be taken every 8 hours as needed - Advise bland diet and hydration - Advise avoiding contact with sick family members  Type 2 Diabetes Mellitus Well-controlled diabetes with recent A1c of 5.0. Blood glucose levels are stable between 117 and 120 mg/dL. No side effects from Trulicity 3 mg. - Continue current diabetes management with Trulicity 3 mg - Monitor blood glucose levels regularly  Hypertension Controlled hypertension with no symptoms such  as headaches, leg swelling, visual changes, or confusion. Currently on lisinopril 20 mg. - Continue lisinopril 20 mg daily  Hyperlipidemia Experiences occasional muscle aches, likely related to atorvastatin. Currently taking atorvastatin nightly. - Advise taking atorvastatin every other night to reduce muscle aches  Depression and Anxiety Improved mood and self-care with current Zoloft regimen. Reports feeling better and engaging in self-care activities. - Continue Zoloft as prescribed  General Health Maintenance Routine health maintenance discussed.  Recent eye exam completed with some vision changes noted. Plans to purchase glasses from [employer] due to insurance limitations. - Perform foot examination regularly - Encourage regular eye exams and update glasses as needed  Follow-up Routine follow-up and monitoring of chronic conditions. - Schedule follow-up appointment in 3 months - Instruct to contact the provider if new symptoms develop or if nausea worsens          Continue all other maintenance medications.  Follow up plan: Return in about 3 months (around 05/27/2024) for DM.   Continue healthy lifestyle choices, including diet (rich in fruits, vegetables, and lean proteins, and low in salt and simple carbohydrates) and exercise (at least 30 minutes of moderate physical activity daily).  Educational handout given for DM  The above assessment and management plan was discussed with the patient. The patient verbalized understanding of and has agreed to the management plan. Patient is aware to call the clinic if they develop any new symptoms or if symptoms persist or worsen. Patient is aware when to return to the clinic for a follow-up visit. Patient educated on when it is appropriate to go to the emergency department.   Kari Baars, FNP-C [facility] [phone]

## 2024-02-25 NOTE — Patient Instructions (Addendum)

## 2024-02-26 ENCOUNTER — Encounter: Payer: Self-pay | Admitting: Family Medicine

## 2024-02-26 LAB — THYROID PANEL WITH TSH
Free Thyroxine Index: 2 (ref 1.2–4.9)
T3 Uptake Ratio: 24 % (ref 24–39)
T4, Total: 8.4 ug/dL (ref 4.5–12.0)
TSH: 1.85 u[IU]/mL (ref 0.450–4.500)

## 2024-02-26 LAB — LIPID PANEL
Chol/HDL Ratio: 5.4 ratio — ABNORMAL HIGH (ref 0.0–4.4)
Cholesterol, Total: 177 mg/dL (ref 100–199)
HDL: 33 mg/dL — ABNORMAL LOW (ref >39)
LDL Chol Calc (NIH): 107 mg/dL — ABNORMAL HIGH (ref 0–99)
Triglycerides: 210 mg/dL — ABNORMAL HIGH (ref 0–149)
VLDL Cholesterol Cal: 37 mg/dL (ref 5–40)

## 2024-03-03 ENCOUNTER — Other Ambulatory Visit: Payer: Self-pay | Admitting: Family Medicine

## 2024-03-03 DIAGNOSIS — K529 Noninfective gastroenteritis and colitis, unspecified: Secondary | ICD-10-CM

## 2024-03-09 ENCOUNTER — Other Ambulatory Visit: Payer: Self-pay | Admitting: Family Medicine

## 2024-03-09 DIAGNOSIS — E1159 Type 2 diabetes mellitus with other circulatory complications: Secondary | ICD-10-CM

## 2024-04-08 DIAGNOSIS — Z79899 Other long term (current) drug therapy: Secondary | ICD-10-CM | POA: Diagnosis not present

## 2024-04-08 DIAGNOSIS — I10 Essential (primary) hypertension: Secondary | ICD-10-CM | POA: Diagnosis not present

## 2024-04-08 DIAGNOSIS — H6092 Unspecified otitis externa, left ear: Secondary | ICD-10-CM | POA: Diagnosis not present

## 2024-04-08 DIAGNOSIS — Z7984 Long term (current) use of oral hypoglycemic drugs: Secondary | ICD-10-CM | POA: Diagnosis not present

## 2024-04-08 DIAGNOSIS — E119 Type 2 diabetes mellitus without complications: Secondary | ICD-10-CM | POA: Diagnosis not present

## 2024-04-08 DIAGNOSIS — Z885 Allergy status to narcotic agent status: Secondary | ICD-10-CM | POA: Diagnosis not present

## 2024-04-08 DIAGNOSIS — Z880 Allergy status to penicillin: Secondary | ICD-10-CM | POA: Diagnosis not present

## 2024-04-08 DIAGNOSIS — H6090 Unspecified otitis externa, unspecified ear: Secondary | ICD-10-CM | POA: Diagnosis not present

## 2024-04-08 DIAGNOSIS — Z886 Allergy status to analgesic agent status: Secondary | ICD-10-CM | POA: Diagnosis not present

## 2024-04-08 DIAGNOSIS — Z7985 Long-term (current) use of injectable non-insulin antidiabetic drugs: Secondary | ICD-10-CM | POA: Diagnosis not present

## 2024-04-20 ENCOUNTER — Ambulatory Visit (INDEPENDENT_AMBULATORY_CARE_PROVIDER_SITE_OTHER)

## 2024-04-20 ENCOUNTER — Ambulatory Visit: Admitting: Family Medicine

## 2024-04-20 ENCOUNTER — Encounter: Payer: Self-pay | Admitting: Family Medicine

## 2024-04-20 VITALS — BP 149/90 | HR 72 | Temp 97.3°F | Ht 65.0 in | Wt 254.6 lb

## 2024-04-20 DIAGNOSIS — H6592 Unspecified nonsuppurative otitis media, left ear: Secondary | ICD-10-CM

## 2024-04-20 DIAGNOSIS — M25562 Pain in left knee: Secondary | ICD-10-CM | POA: Diagnosis not present

## 2024-04-20 DIAGNOSIS — M7989 Other specified soft tissue disorders: Secondary | ICD-10-CM | POA: Diagnosis not present

## 2024-04-20 MED ORDER — KETOROLAC TROMETHAMINE 30 MG/ML IJ SOLN
30.0000 mg | Freq: Once | INTRAMUSCULAR | Status: AC
  Administered 2024-04-20: 30 mg via INTRAMUSCULAR

## 2024-04-20 NOTE — Progress Notes (Signed)
 Subjective:  Patient ID: Paula Butler, female    DOB: Apr 20, 1971, 53 y.o.   MRN: [id number]  Patient Care Team: Galvin Jules, FNP as PCP - General (Family Medicine) Vinetta Greening, DO as Consulting Physician (Gastroenterology)   Chief Complaint:  Knee Pain (Left knee pain x 1 week ago) and Ear Pain (Left- patient went to ER and has finished all abx but would like her ear re checked. )   HPI: Paula Butler is a 53 y.o. female presenting on 04/20/2024 for Knee Pain (Left knee pain x 1 week ago) and Ear Pain (Left- patient went to ER and has finished all abx but would like her ear re checked. )   Paula Butler is a 53 year old female who presents with left knee pain.  She has been experiencing severe pain in her left knee for about a week. The pain is described as sharp and radiates down the front and back of her leg, occurring all around the knee and 'shooting down the leg'. There is associated swelling in the knee.  She has been taking ibuprofen  and applying ice, which provide minimal relief. She has also tried using baking soda and alcohol  without effect. She elevates the knee when sitting and wraps it, but these measures do not help much. No previous knee injuries reported.   She recently had an ear infection in her left ear, treated last week. She would like this reevaluated today.    Relevant past medical, surgical, family, and social history reviewed and updated as indicated.  Allergies and medications reviewed and updated. Data reviewed: Chart in Epic.   Past Medical History:  Diagnosis Date   Diabetes mellitus without complication (HCC)    Hyperlipidemia    Hypertension     Past Surgical History:  Procedure Laterality Date   CHOLECYSTECTOMY     ESOPHAGOGASTRODUODENOSCOPY  06/14/2009   Dr. Tor Freed; mild esophagitis biopsied, small gastric ulcer-healing s/p biopsied, gastritis biopsied, mild duodenitis biopsied.  Esophageal biopsy unremarkable, gastric  and duodenal biopsy with no significant histopathologic features.   EXCISION OF BREAST BIOPSY Left 05/29/2018   fibrocystic changes retroareolar excisional bx   TUBAL LIGATION      Social History   Socioeconomic History   Marital status: Married    Spouse name: Jose   Number of children: 3   Years of education: Not on file   Highest education level: Not on file  Occupational History   Occupation: disability  Tobacco Use   Smoking status: Never   Smokeless tobacco: Never  Vaping Use   Vaping status: Never Used  Substance and Sexual Activity   Alcohol  use: No   Drug use: No   Sexual activity: Not Currently    Partners: Male    Birth control/protection: Surgical    Comment: tubal  Other Topics Concern   Not on file  Social History Narrative   Married for 10 years, 3 children, all grown/teenagers.    2 daughters, 1 son - children live with her   Attends church.    Walks for exercise.   Enjoys singing.   Eats all food groups.   Wears seatbelt.   Born in [place], grew up in [place] . Father in [employer]. Grandmother raised her.    Moved from [place]  in 2018.    Does not work out side of home.    Social Drivers of Corporate investment banker Strain: Low Risk  (11/04/2023)   Overall Physicist, medical  Strain (CARDIA)    Difficulty of Paying Living Expenses: Not hard at all  Food Insecurity: No Food Insecurity (11/04/2023)   Hunger Vital Sign    Worried About Running Out of Food in the Last Year: Never true    Ran Out of Food in the Last Year: Never true  Transportation Needs: No Transportation Needs (11/04/2023)   PRAPARE - Administrator, Civil Service (Medical): No    Lack of Transportation (Non-Medical): No  Physical Activity: Sufficiently Active (11/04/2023)   Exercise Vital Sign    Days of Exercise per Week: 4 days    Minutes of Exercise per Session: 40 min  Stress: No Stress Concern Present (11/04/2023)   [employer] of Occupational Health -  Occupational Stress Questionnaire    Feeling of Stress : Not at all  Social Connections: Moderately Isolated (11/04/2023)   Social Connection and Isolation Panel [NHANES]    Frequency of Communication with Friends and Family: More than three times a week    Frequency of Social Gatherings with Friends and Family: More than three times a week    Attends Religious Services: Never    Database administrator or Organizations: No    Attends Banker Meetings: Never    Marital Status: Married  Catering manager Violence: Not At Risk (11/04/2023)   Humiliation, Afraid, Rape, and Kick questionnaire    Fear of Current or Ex-Partner: No    Emotionally Abused: No    Physically Abused: No    Sexually Abused: No    Outpatient Encounter Medications as of 04/20/2024  Medication Sig   atorvastatin  (LIPITOR) 20 MG tablet Take 1 tablet (20 mg total) by mouth daily.   Blood Glucose Monitoring Suppl DEVI 1 each by Does not apply route in the morning, at noon, and at bedtime. May substitute to any manufacturer covered by patient's insurance.   Dulaglutide  (TRULICITY ) 3 MG/0.5ML SOAJ INJECT 3 MG UNDER THE SKIN ONCE EVERY WEEK AS DIRECTED   lisinopril  (ZESTRIL ) 20 MG tablet TAKE 1 TABLET BY MOUTH DAILY   sertraline  (ZOLOFT ) 50 MG tablet Take 2 tablets (100 mg total) by mouth daily.   [DISCONTINUED] ondansetron  (ZOFRAN ) 4 MG tablet TAKE 1 TABLET BY MOUTH EVERY 8 HOURS AS NEEDED FOR NAUSE OR VOMITING.   No facility-administered encounter medications on file as of 04/20/2024.    Allergies  Allergen Reactions   Fish Allergy Anaphylaxis, Shortness Of Breath and Rash   Shrimp [Shellfish Allergy] Anaphylaxis, Shortness Of Breath and Rash   Asa [Aspirin] Other (See Comments)    Abdominal swelling    Morpholine Salicylate     Other Reaction(s): Other (See Comments)  "Abdominal bloating, extremely painful"   Penicillins Hives, Itching and Rash    Has patient had a PCN reaction causing immediate rash,  facial/tongue/throat swelling, SOB or lightheadedness with hypotension: Yes Has patient had a PCN reaction causing severe rash involving mucus membranes or skin necrosis: No Has patient had a PCN reaction that required hospitalization: Yes Has patient had a PCN reaction occurring within the last 10 years: Yes If all of the above answers are "NO", then may proceed with Cephalosporin use.     Pertinent ROS per HPI, otherwise unremarkable      Objective:  BP (!) 149/90   Pulse 72   Temp (!) 97.3 F (36.3 C)   Ht 5\' 5"  (1.651 m)   Wt 254 lb 9.6 oz (115.5 kg)   SpO2 99%   BMI 42.37 kg/m  Wt Readings from Last 3 Encounters:  04/20/24 254 lb 9.6 oz (115.5 kg)  02/25/24 249 lb 6.4 oz (113.1 kg)  02/24/24 249 lb 9 oz (113.2 kg)    Physical Exam Vitals and nursing note reviewed.  Constitutional:      Appearance: Normal appearance. She is well-developed and well-groomed. She is morbidly obese.  HENT:     Head: Normocephalic and atraumatic.     Left Ear: A middle ear effusion is present. Tympanic membrane is injected.     Nose: Nose normal.     Mouth/Throat:     Mouth: Mucous membranes are moist.  Eyes:     Conjunctiva/sclera: Conjunctivae normal.     Pupils: Pupils are equal, round, and reactive to light.  Cardiovascular:     Rate and Rhythm: Normal rate and regular rhythm.     Pulses: Normal pulses.     Heart sounds: Normal heart sounds.  Pulmonary:     Effort: Pulmonary effort is normal.     Breath sounds: Normal breath sounds.  Musculoskeletal:     Right upper leg: Normal.     Left upper leg: Normal.     Right knee: Normal.     Left knee: Swelling present. No deformity, effusion, erythema, ecchymosis, lacerations, bony tenderness or crepitus. Normal range of motion. No tenderness. No LCL laxity, MCL laxity, ACL laxity or PCL laxity.Normal alignment, normal meniscus and normal patellar mobility. Normal pulse.     Instability Tests: Anterior drawer test negative.  Posterior drawer test negative. Anterior Lachman test negative. Medial McMurray test negative and lateral McMurray test negative.     Right lower leg: Normal.     Left lower leg: Normal.  Neurological:     General: No focal deficit present.     Mental Status: She is alert and oriented to person, place, and time.  Psychiatric:        Behavior: Behavior is cooperative.     Results for orders placed or performed in visit on 02/25/24  Bayer DCA Hb A1c Waived   Collection Time: 02/25/24 11:46 AM  Result Value Ref Range   HB A1C (BAYER DCA - WAIVED) 5.0 4.8 - 5.6 %  Lipid panel   Collection Time: 02/25/24 11:47 AM  Result Value Ref Range   Cholesterol, Total 177 100 - 199 mg/dL   Triglycerides 161 (H) 0 - 149 mg/dL   HDL 33 (L) >09 mg/dL   VLDL Cholesterol Cal 37 5 - 40 mg/dL   LDL Chol Calc (NIH) 604 (H) 0 - 99 mg/dL   Chol/HDL Ratio 5.4 (H) 0.0 - 4.4 ratio  Thyroid  Panel With TSH   Collection Time: 02/25/24 11:47 AM  Result Value Ref Range   TSH 1.850 0.450 - 4.500 uIU/mL   T4, Total 8.4 4.5 - 12.0 ug/dL   T3 Uptake Ratio 24 24 - 39 %   Free Thyroxine Index 2.0 1.2 - 4.9     X-Ray: left knee: medial joint space narrowing with degenerative changes. No acute findings. Preliminary x-ray reading by Kattie Parrot, FNP-C, [facility].   Pertinent labs & imaging results that were available during my care of the patient were reviewed by me and considered in my medical decision making.  Assessment & Plan:  Adiline was seen today for knee pain and ear pain.  Diagnoses and all orders for this visit:  Acute pain of left knee -     DG Knee 1-2 Views Left -     Ambulatory referral to Physical  Therapy  Left otitis media with effusion Flonase use encouraged.       Left knee pain due to degenerative changes Left knee pain with sharp, radiating pain and slight swelling. X-ray reveals joint space narrowing on the medial aspect, indicating degenerative changes likely due to arthritis. No  previous injuries reported. Ibuprofen  and home remedies have been ineffective. Steroids are avoided due to diabetes. Topical treatments and physical therapy are preferred. Toradol  injection and Tylenol  Arthritis are recommended for pain management. - Administer Toradol  injection for pain relief - Prescribe Tylenol  Arthritis twice daily, less than 1500 mg daily - Apply Ace wrap to left knee - Refer to physical therapy for knee rehabilitation - Consider referral to orthopedic surgeon if no improvement  Diabetes mellitus Diabetes mellitus is a consideration in the management of left knee pain due to degenerative changes. Steroid use is avoided due to diabetes.  Left ear infection Recent left ear infection treated last week. Ear appears slightly red but improving. No further medication needed. Flonase recommended to manage post-infection fluid buildup. - Recommend Flonase daily for a few days to manage post-infection fluid buildup          Continue all other maintenance medications.  Follow up plan: Return if symptoms worsen or fail to improve.   Continue healthy lifestyle choices, including diet (rich in fruits, vegetables, and lean proteins, and low in salt and simple carbohydrates) and exercise (at least 30 minutes of moderate physical activity daily).  Educational handout given for knee pain  The above assessment and management plan was discussed with the patient. The patient verbalized understanding of and has agreed to the management plan. Patient is aware to call the clinic if they develop any new symptoms or if symptoms persist or worsen. Patient is aware when to return to the clinic for a follow-up visit. Patient educated on when it is appropriate to go to the emergency department.   Kattie Parrot, FNP-C [facility] [phone]

## 2024-04-20 NOTE — Addendum Note (Signed)
 Addended by: Rudolfo Cosier C on: 04/20/2024 11:17 AM   Modules accepted: Orders

## 2024-04-24 DIAGNOSIS — I10 Essential (primary) hypertension: Secondary | ICD-10-CM | POA: Diagnosis not present

## 2024-04-24 DIAGNOSIS — E119 Type 2 diabetes mellitus without complications: Secondary | ICD-10-CM | POA: Diagnosis not present

## 2024-04-24 DIAGNOSIS — Z7985 Long-term (current) use of injectable non-insulin antidiabetic drugs: Secondary | ICD-10-CM | POA: Diagnosis not present

## 2024-04-24 DIAGNOSIS — Z880 Allergy status to penicillin: Secondary | ICD-10-CM | POA: Diagnosis not present

## 2024-04-24 DIAGNOSIS — Z886 Allergy status to analgesic agent status: Secondary | ICD-10-CM | POA: Diagnosis not present

## 2024-04-24 DIAGNOSIS — Z91013 Allergy to seafood: Secondary | ICD-10-CM | POA: Diagnosis not present

## 2024-04-24 DIAGNOSIS — E785 Hyperlipidemia, unspecified: Secondary | ICD-10-CM | POA: Diagnosis not present

## 2024-04-24 DIAGNOSIS — Z79899 Other long term (current) drug therapy: Secondary | ICD-10-CM | POA: Diagnosis not present

## 2024-04-24 DIAGNOSIS — M25562 Pain in left knee: Secondary | ICD-10-CM | POA: Diagnosis not present

## 2024-04-24 DIAGNOSIS — Z7984 Long term (current) use of oral hypoglycemic drugs: Secondary | ICD-10-CM | POA: Diagnosis not present

## 2024-04-27 ENCOUNTER — Other Ambulatory Visit: Payer: Self-pay

## 2024-04-27 ENCOUNTER — Ambulatory Visit: Payer: Self-pay | Admitting: *Deleted

## 2024-04-27 ENCOUNTER — Ambulatory Visit: Attending: Family Medicine | Admitting: Physical Therapy

## 2024-04-27 DIAGNOSIS — M25562 Pain in left knee: Secondary | ICD-10-CM

## 2024-04-27 DIAGNOSIS — M25662 Stiffness of left knee, not elsewhere classified: Secondary | ICD-10-CM

## 2024-04-27 NOTE — Therapy (Signed)
 OUTPATIENT PHYSICAL THERAPY LOWER EXTREMITY EVALUATION   Patient Name: Paula Butler MRN: [id number] DOB:03/06/1971, 53 y.o., female Today's Date: 04/27/2024  END OF SESSION:  PT End of Session - 04/27/24 1224     Visit Number 1    Number of Visits 8    Date for PT Re-Evaluation 05/25/24    PT Start Time 1109    PT Stop Time 1156    PT Time Calculation (min) 47 min    Activity Tolerance Patient tolerated treatment well    Behavior During Therapy WFL for tasks assessed/performed             Past Medical History:  Diagnosis Date   Diabetes mellitus without complication (HCC)    Hyperlipidemia    Hypertension    Past Surgical History:  Procedure Laterality Date   CHOLECYSTECTOMY     ESOPHAGOGASTRODUODENOSCOPY  06/14/2009   Dr. Tor Freed; mild esophagitis biopsied, small gastric ulcer-healing s/p biopsied, gastritis biopsied, mild duodenitis biopsied.  Esophageal biopsy unremarkable, gastric and duodenal biopsy with no significant histopathologic features.   EXCISION OF BREAST BIOPSY Left 05/29/2018   fibrocystic changes retroareolar excisional bx   TUBAL LIGATION     Patient Active Problem List   Diagnosis Date Noted   Depression, major, single episode, moderate (HCC) 12/27/2022   Grief reaction with prolonged bereavement 12/27/2022   Vitamin D deficiency 07/23/2022   Thrombocytopenia (HCC) 07/09/2022   Diabetes mellitus (HCC) 06/04/2022   Hyperlipidemia associated with type 2 diabetes mellitus (HCC) 06/04/2022   Hypertension associated with diabetes (HCC) 06/04/2022   Morbid obesity with BMI of 40.0-44.9, adult (HCC) 06/04/2022   Hepatic steatosis 04/07/2018   REFERRING PROVIDER: Evalyn Hillier  REFERRING DIAG: Acute pain of left knee.  THERAPY DIAG:  Acute pain of left knee  Stiffness of left knee, not elsewhere classified  Rationale for Evaluation and Treatment: Rehabilitation  ONSET DATE: ~2 weeks ago.  SUBJECTIVE:   SUBJECTIVE STATEMENT: The  patient presents to the clinic with c/o left knee pain that she states came on about two weeks ago when she got up to use the restroom.  Her pain is rated at 8/10.  Walking increases her pain and she avoid stairs.  Not moving her knee helps decrease her pain.    PERTINENT HISTORY: See above.   PAIN:  Are you having pain? Yes: NPRS scale: 8/10. Pain location: Left knee Pain description: Sharp and shooting. Aggravating factors: See above. Relieving factors: See above.  PRECAUTIONS: Other: No pain increase therex.    WEIGHT BEARING RESTRICTIONS: No  FALLS:  Has patient fallen in last 6 months? No  LIVING ENVIRONMENT: Lives with: lives with their family Lives in: House/apartment Has following equipment at home: None   PLOF: Independent  PATIENT GOALS: Walk without left knee pain.    OBJECTIVE:  Note: Objective measures were completed at Evaluation unless otherwise noted.  DIAGNOSTIC FINDINGS:1.  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 2. Mild to moderate right medial compartment osteoarthritis.  PATIENT SURVEYS:  LEFS 23/80.  EDEMA:  Circumferential: Left knee 2 cms > right.    PALPATION: C/o pain in the popliteal fossa and c/o pain over the medial and lateral joint lines.  Pain reported into the tib ant region which appears to be referred in nature.    LOWER EXTREMITY ROM:  -10 degrees of left knee extension and flexion to 85 degrees.  Right knee is 0 to 120 degrees.    LOWER EXTREMITY MMT:  Patient able to perform  a left antigravity SAQ and SLR.    LOWER EXTREMITY SPECIAL TESTS: Normal left knee stability. Mild pain with McMurray test.      GAIT: Antalgic gait pattern with "stiff-legged" gait pattern.                                                                                                                                 TREATMENT DATE: LE elavation and vasopneumatic on low x 20 minutes.      PATIENT EDUCATION:   Education details: See below. Person educated: Patient Education method: Programmer, multimedia, Demonstration, Verbal cues, and Handouts Education comprehension: verbalized understanding and returned demonstration  HOME EXERCISE PROGRAM: HOME EXERCISE PROGRAM [[id number]]  QUAD SETS - ISOMETRIC QUADS -  Repeat 10 Repetitions, Hold 10 Seconds, Complete 2 Sets, Perform 3 Times a Day  STRAIGHT LEG RAISE - SLR -  Repeat 20 Repetitions, Hold 1 Second(s), Complete 2 Sets, Perform 3 Times a Day  HEEL SLIDES - SUPINE -  Repeat 15 Repetitions, Hold 5 Seconds, Complete 2 Sets, Perform 3 Times a Day  ASSESSMENT:  CLINICAL IMPRESSION: The patient presents to OPPT with c/o left knee pain over the last two weeks.  Her gait is antalgic in nature and she walks stiff-legged.  She has limitation of range per contralateral comparison.  Her knee is stable  She is able to perform an antigravity SLR and SAQ. She has some minimal edema.  Her LEFS score is 23/80.  Patient will benefit from skilled physical therapy intervention to address pain and deficits.   OBJECTIVE IMPAIRMENTS: Abnormal gait, decreased activity tolerance, decreased ROM, and pain.   ACTIVITY LIMITATIONS: carrying, lifting, bending, standing, stairs, and locomotion level  PARTICIPATION LIMITATIONS: meal prep, cleaning, laundry, shopping, community activity, and yard work   [facility] POTENTIAL: Good  CLINICAL DECISION MAKING: Evolving/moderate complexity  EVALUATION COMPLEXITY: Low   GOALS:  LONG TERM GOALS: Target date: 05/25/24.  Ind with a HEP.  Goal status: INITIAL  2.  Full active left knee extension.  Goal status: INITIAL  3.  Active left knee flexion to 115 degrees.  Goal status: INITIAL  4.  Perform ADL's with left knee pain not > 3/10.  Goal status: INITIAL  5.  Patient able to perform a reciprocating stair gait with one railing.   Baseline:  Goal status: INITIAL   PLAN:  PT FREQUENCY: 2x/week  PT DURATION: 4  weeks  PLANNED INTERVENTIONS: 97110-Therapeutic exercises, 97530- Therapeutic activity, [id number]- Neuromuscular re-education, 97535- Self Care, [id number]- Manual therapy, [id number]- Electrical stimulation (unattended), 97016- Vasopneumatic device, 97035- Ultrasound, Patient/Family education, Stair training, Cryotherapy, and Moist heat  PLAN FOR NEXT SESSION: Nustep, ROM, no pain increase therex.  Modalities as needed.   Taniela Feltus, [place], PT 04/27/2024, 12:58 PM

## 2024-05-03 ENCOUNTER — Encounter: Admitting: Physical Therapy

## 2024-05-05 ENCOUNTER — Encounter: Payer: Self-pay | Admitting: *Deleted

## 2024-05-05 ENCOUNTER — Encounter

## 2024-05-07 ENCOUNTER — Other Ambulatory Visit: Payer: Self-pay | Admitting: Family Medicine

## 2024-05-07 DIAGNOSIS — E1169 Type 2 diabetes mellitus with other specified complication: Secondary | ICD-10-CM

## 2024-05-13 DIAGNOSIS — M2392 Unspecified internal derangement of left knee: Secondary | ICD-10-CM | POA: Diagnosis not present

## 2024-05-13 DIAGNOSIS — M25562 Pain in left knee: Secondary | ICD-10-CM | POA: Diagnosis not present

## 2024-05-13 DIAGNOSIS — G8929 Other chronic pain: Secondary | ICD-10-CM | POA: Diagnosis not present

## 2024-05-13 DIAGNOSIS — M1712 Unilateral primary osteoarthritis, left knee: Secondary | ICD-10-CM | POA: Diagnosis not present

## 2024-05-27 ENCOUNTER — Ambulatory Visit: Admitting: Family Medicine

## 2024-06-01 ENCOUNTER — Encounter: Payer: Self-pay | Admitting: Family Medicine

## 2024-06-01 ENCOUNTER — Ambulatory Visit: Admitting: Family Medicine

## 2024-06-01 VITALS — BP 122/86 | HR 73 | Temp 97.9°F | Ht 65.0 in | Wt 257.8 lb

## 2024-06-01 DIAGNOSIS — Z6841 Body Mass Index (BMI) 40.0 and over, adult: Secondary | ICD-10-CM

## 2024-06-01 DIAGNOSIS — Z23 Encounter for immunization: Secondary | ICD-10-CM | POA: Diagnosis not present

## 2024-06-01 DIAGNOSIS — F321 Major depressive disorder, single episode, moderate: Secondary | ICD-10-CM

## 2024-06-01 DIAGNOSIS — I152 Hypertension secondary to endocrine disorders: Secondary | ICD-10-CM | POA: Diagnosis not present

## 2024-06-01 DIAGNOSIS — F4329 Adjustment disorder with other symptoms: Secondary | ICD-10-CM | POA: Diagnosis not present

## 2024-06-01 DIAGNOSIS — E1159 Type 2 diabetes mellitus with other circulatory complications: Secondary | ICD-10-CM | POA: Diagnosis not present

## 2024-06-01 DIAGNOSIS — F411 Generalized anxiety disorder: Secondary | ICD-10-CM

## 2024-06-01 DIAGNOSIS — E785 Hyperlipidemia, unspecified: Secondary | ICD-10-CM | POA: Diagnosis not present

## 2024-06-01 DIAGNOSIS — E1169 Type 2 diabetes mellitus with other specified complication: Secondary | ICD-10-CM | POA: Diagnosis not present

## 2024-06-01 LAB — BAYER DCA HB A1C WAIVED: HB A1C (BAYER DCA - WAIVED): 5.4 % (ref 4.8–5.6)

## 2024-06-01 MED ORDER — TRULICITY 4.5 MG/0.5ML ~~LOC~~ SOAJ: 4.5000 mg | SUBCUTANEOUS | 3 refills | Status: DC

## 2024-06-01 MED ORDER — PRAVASTATIN SODIUM 10 MG PO TABS: 10.0000 mg | ORAL_TABLET | Freq: Every day | ORAL | 1 refills | Status: DC

## 2024-06-01 MED ORDER — SERTRALINE HCL 50 MG PO TABS
100.0000 mg | ORAL_TABLET | Freq: Every day | ORAL | 1 refills | Status: DC
Start: 2024-06-01 — End: 2024-09-02

## 2024-06-01 MED ORDER — LISINOPRIL 20 MG PO TABS: 20.0000 mg | ORAL_TABLET | Freq: Every day | ORAL | 1 refills | Status: DC

## 2024-06-01 NOTE — Progress Notes (Signed)
 Subjective:  Patient ID: Paula Butler, female    DOB: [birth year], 53 y.o.   MRN: [id number]  Patient Care Team: Galvin Jules, FNP as PCP - General (Family Medicine) Vinetta Greening, DO as Consulting Physician (Gastroenterology)   Chief Complaint:  Diabetes (3 month follow up )   HPI: Paula Butler is a 53 y.o. female presenting on 06/01/2024 for Diabetes (3 month follow up )  Paula Butler is a 53 year old female with depression and hypertension who presents for follow-up.  She experiences intermittent episodes of depression, which she manages by engaging in activities such as going out, taking showers, and making herself presentable. These activities are helpful in managing her mood. She continues to take her antidepressant medication at a dose of 100 mg, two tablets, without any issues.  Her sleep pattern is variable, with occasional difficulty sleeping at night, which she compensates for by sleeping during the day. Overall, she feels her sleep quality is more good than bad.  There has been an increase in her weight from 249 lbs to 257 lbs, attributed to reduced physical activity due to knee pain. No changes have been made to her diet.  She experiences knee pain, which limits her ability to walk. Engaging in water exercises with a friend who is a therapist has improved her mobility and reduced pain.  She is on 3 mg of Trulicity  for diabetes management, with her last blood sugar reading at 127 mg/dL. No symptoms of increased hunger, thirst, or urination, though she reports some urination issues.  No issues with her blood pressure medication. No chest pain, leg swelling, shortness of breath, dizziness, or syncope.          Relevant past medical, surgical, family, and social history reviewed and updated as indicated.  Allergies and medications reviewed and updated. Data reviewed: Chart in Epic.   Past Medical History:  Diagnosis Date   Diabetes mellitus  without complication (HCC)    Hyperlipidemia    Hypertension     Past Surgical History:  Procedure Laterality Date   CHOLECYSTECTOMY     ESOPHAGOGASTRODUODENOSCOPY  06/14/2009   Dr. Tor Freed; mild esophagitis biopsied, small gastric ulcer-healing s/p biopsied, gastritis biopsied, mild duodenitis biopsied.  Esophageal biopsy unremarkable, gastric and duodenal biopsy with no significant histopathologic features.   EXCISION OF BREAST BIOPSY Left 05/29/2018   fibrocystic changes retroareolar excisional bx   TUBAL LIGATION      Social History   Socioeconomic History   Marital status: Married    Spouse name: Jose   Number of children: 3   Years of education: Not on file   Highest education level: Not on file  Occupational History   Occupation: disability  Tobacco Use   Smoking status: Never   Smokeless tobacco: Never  Vaping Use   Vaping status: Never Used  Substance and Sexual Activity   Alcohol  use: No   Drug use: No   Sexual activity: Not Currently    Partners: Male    Birth control/protection: Surgical    Comment: tubal  Other Topics Concern   Not on file  Social History Narrative   Married for 10 years, 3 children, all grown/teenagers.    2 daughters, 1 son - children live with her   Attends church.    Walks for exercise.   Enjoys singing.   Eats all food groups.   Wears seatbelt.   Born in [place], grew up in [place] . Father in [employer]. Grandmother  raised her.    Moved from [place]  in 2018.    Does not work out side of home.    Social Drivers of Corporate investment banker Strain: Low Risk  (11/04/2023)   Overall Financial Resource Strain (CARDIA)    Difficulty of Paying Living Expenses: Not hard at all  Food Insecurity: No Food Insecurity (11/04/2023)   Hunger Vital Sign    Worried About Running Out of Food in the Last Year: Never true    Ran Out of Food in the Last Year: Never true  Transportation Needs: No Transportation Needs (11/04/2023)   PRAPARE -  Administrator, Civil Service (Medical): No    Lack of Transportation (Non-Medical): No  Physical Activity: Sufficiently Active (11/04/2023)   Exercise Vital Sign    Days of Exercise per Week: 4 days    Minutes of Exercise per Session: 40 min  Stress: No Stress Concern Present (11/04/2023)   [employer] of Occupational Health - Occupational Stress Questionnaire    Feeling of Stress : Not at all  Social Connections: Moderately Isolated (11/04/2023)   Social Connection and Isolation Panel    Frequency of Communication with Friends and Family: More than three times a week    Frequency of Social Gatherings with Friends and Family: More than three times a week    Attends Religious Services: Never    Database administrator or Organizations: No    Attends Banker Meetings: Never    Marital Status: Married  Catering manager Violence: Not At Risk (11/04/2023)   Humiliation, Afraid, Rape, and Kick questionnaire    Fear of Current or Ex-Partner: No    Emotionally Abused: No    Physically Abused: No    Sexually Abused: No    Outpatient Encounter Medications as of 06/01/2024  Medication Sig   Blood Glucose Monitoring Suppl DEVI 1 each by Does not apply route in the morning, at noon, and at bedtime. May substitute to any manufacturer covered by patient's insurance.   Dulaglutide  (TRULICITY ) 4.5 MG/0.5ML SOAJ Inject 4.5 mg as directed once a week.   pravastatin (PRAVACHOL) 10 MG tablet Take 1 tablet (10 mg total) by mouth daily.   [DISCONTINUED] lisinopril  (ZESTRIL ) 20 MG tablet TAKE 1 TABLET BY MOUTH DAILY   [DISCONTINUED] sertraline  (ZOLOFT ) 50 MG tablet Take 2 tablets (100 mg total) by mouth daily.   [DISCONTINUED] TRULICITY  3 MG/0.5ML SOAJ INJECT 3 MG UNDER THE SKIN ONCE EVERY WEEK AS DIRECTED   lisinopril  (ZESTRIL ) 20 MG tablet Take 1 tablet (20 mg total) by mouth daily.   sertraline  (ZOLOFT ) 50 MG tablet Take 2 tablets (100 mg total) by mouth daily.    [DISCONTINUED] atorvastatin  (LIPITOR) 20 MG tablet Take 1 tablet (20 mg total) by mouth daily. (Patient not taking: Reported on 06/01/2024)   [DISCONTINUED] Naproxen  Sodium (FLANAX PO) Take by mouth.   [DISCONTINUED] PREDNISONE  PO Take by mouth.   No facility-administered encounter medications on file as of 06/01/2024.    Allergies  Allergen Reactions   Fish Allergy Anaphylaxis, Shortness Of Breath and Rash   Shrimp [Shellfish Allergy] Anaphylaxis, Shortness Of Breath and Rash   Asa [Aspirin] Other (See Comments)    Abdominal swelling    Morpholine Salicylate     Other Reaction(s): Other (See Comments)  Abdominal bloating, extremely painful  Other Reaction(s): Other (See Comments)  Abdominal bloating, extremely painful   Penicillins Hives, Itching and Rash    Has patient had a PCN reaction causing immediate  rash, facial/tongue/throat swelling, SOB or lightheadedness with hypotension: Yes Has patient had a PCN reaction causing severe rash involving mucus membranes or skin necrosis: No Has patient had a PCN reaction that required hospitalization: Yes Has patient had a PCN reaction occurring within the last 10 years: Yes If all of the above answers are NO, then may proceed with Cephalosporin use.     Pertinent ROS per HPI, otherwise unremarkable      Objective:  BP 122/86   Pulse 73   Temp 97.9 F (36.6 C)   Ht 5' 5 (1.651 m)   Wt 257 lb 12.8 oz (116.9 kg)   SpO2 97%   BMI 42.90 kg/m    Wt Readings from Last 3 Encounters:  06/01/24 257 lb 12.8 oz (116.9 kg)  04/20/24 254 lb 9.6 oz (115.5 kg)  02/25/24 249 lb 6.4 oz (113.1 kg)    Physical Exam Vitals and nursing note reviewed.  Constitutional:      General: She is not in acute distress.    Appearance: Normal appearance. She is well-developed and well-groomed. She is morbidly obese. She is not ill-appearing, toxic-appearing or diaphoretic.  HENT:     Head: Normocephalic and atraumatic.     Nose: Nose normal.      Mouth/Throat:     Mouth: Mucous membranes are moist.     Pharynx: Oropharynx is clear.   Eyes:     Conjunctiva/sclera: Conjunctivae normal.     Pupils: Pupils are equal, round, and reactive to light.    Cardiovascular:     Rate and Rhythm: Normal rate and regular rhythm.     Heart sounds: Normal heart sounds.  Pulmonary:     Effort: Pulmonary effort is normal.     Breath sounds: Normal breath sounds.   Musculoskeletal:     Right lower leg: No edema.     Left lower leg: No edema.   Skin:    General: Skin is warm and dry.     Capillary Refill: Capillary refill takes less than 2 seconds.   Neurological:     General: No focal deficit present.     Mental Status: She is alert and oriented to person, place, and time.   Psychiatric:        Mood and Affect: Mood normal.        Behavior: Behavior normal. Behavior is cooperative.        Thought Content: Thought content normal.        Judgment: Judgment normal.     Results for orders placed or performed in visit on 02/25/24  Bayer DCA Hb A1c Waived   Collection Time: 02/25/24 11:46 AM  Result Value Ref Range   HB A1C (BAYER DCA - WAIVED) 5.0 4.8 - 5.6 %  Lipid panel   Collection Time: 02/25/24 11:47 AM  Result Value Ref Range   Cholesterol, Total 177 100 - 199 mg/dL   Triglycerides 161 (H) 0 - 149 mg/dL   HDL 33 (L) >09 mg/dL   VLDL Cholesterol Cal 37 5 - 40 mg/dL   LDL Chol Calc (NIH) 604 (H) 0 - 99 mg/dL   Chol/HDL Ratio 5.4 (H) 0.0 - 4.4 ratio  Thyroid  Panel With TSH   Collection Time: 02/25/24 11:47 AM  Result Value Ref Range   TSH 1.850 0.450 - 4.500 uIU/mL   T4, Total 8.4 4.5 - 12.0 ug/dL   T3 Uptake Ratio 24 24 - 39 %   Free Thyroxine Index 2.0 1.2 - 4.9  Pertinent labs & imaging results that were available during my care of the patient were reviewed by me and considered in my medical decision making.  Assessment & Plan:  Paula Butler was seen today for diabetes.  Diagnoses and all orders for this  visit:  Type 2 diabetes mellitus with other specified complication, without long-term current use of insulin (HCC) -     Bayer DCA Hb A1c Waived -     Dulaglutide  (TRULICITY ) 4.5 MG/0.5ML SOAJ; Inject 4.5 mg as directed once a week. -     pravastatin (PRAVACHOL) 10 MG tablet; Take 1 tablet (10 mg total) by mouth daily.  GAD (generalized anxiety disorder) -     sertraline  (ZOLOFT ) 50 MG tablet; Take 2 tablets (100 mg total) by mouth daily.  Depression, major, single episode, moderate (HCC) -     sertraline  (ZOLOFT ) 50 MG tablet; Take 2 tablets (100 mg total) by mouth daily.  Grief reaction with prolonged bereavement -     sertraline  (ZOLOFT ) 50 MG tablet; Take 2 tablets (100 mg total) by mouth daily.  Hypertension associated with diabetes (HCC) -     lisinopril  (ZESTRIL ) 20 MG tablet; Take 1 tablet (20 mg total) by mouth daily. -     Dulaglutide  (TRULICITY ) 4.5 MG/0.5ML SOAJ; Inject 4.5 mg as directed once a week.  Hyperlipidemia associated with type 2 diabetes mellitus (HCC) -     Lipid panel -     Dulaglutide  (TRULICITY ) 4.5 MG/0.5ML SOAJ; Inject 4.5 mg as directed once a week. -     pravastatin (PRAVACHOL) 10 MG tablet; Take 1 tablet (10 mg total) by mouth daily.  Morbid obesity with BMI of 40.0-44.9, adult (HCC) -     Dulaglutide  (TRULICITY ) 4.5 MG/0.5ML SOAJ; Inject 4.5 mg as directed once a week.  Need for vaccination -     Zoster Recombinant (Shingrix )      Obesity Weight increased from 249 lbs to 257 lbs, likely due to decreased physical activity related to knee pain. - Increase Trulicity  to 4.5 mg to aid in weight reduction and improve A1c. - Encourage participation in water aerobics for low-impact exercise. - Consider gym membership for additional physical activity.  Knee Pain Knee pain limits physical activity, contributing to weight gain. Water exercises have improved mobility and reduced pain. - Encourage continuation of water exercises to improve knee mobility  and reduce pain.  Type 2 Diabetes Mellitus Blood sugar levels are well controlled with a recent reading of 127 mg/dL. A1c expected to be below 7%. No symptoms of hyperglycemia reported. - Monitor blood sugar levels regularly. - Increase Trulicity  to 4.5 mg weekly for better weight reduction.   Hypertension Blood pressure is well controlled with current medication regimen. - Continue current antihypertensive medication.  Hyperlipidemia Not taking current cholesterol medication due to side effects. Plan to initiate pravastatin three times a week to minimize side effects while providing lipid reduction. - Start pravastatin three times a week.  Depression Reports intermittent episodes of severe depression but manages with activities such as going out and self-care. Currently on 100 mg of medication, taking two tablets. - Continue current antidepressant regimen at 100 mg, two tablets.  General Health Maintenance Due for shingles vaccination. - Administer first dose of shingles vaccine. - Schedule follow-up for the second dose of shingles vaccine.          Continue all other maintenance medications.  Follow up plan: Return in about 3 months (around 09/01/2024) for chronic follow up, all labs, DM.  Continue healthy lifestyle choices, including diet (rich in fruits, vegetables, and lean proteins, and low in salt and simple carbohydrates) and exercise (at least 30 minutes of moderate physical activity daily).  Educational handout given for DM  The above assessment and management plan was discussed with the patient. The patient verbalized understanding of and has agreed to the management plan. Patient is aware to call the clinic if they develop any new symptoms or if symptoms persist or worsen. Patient is aware when to return to the clinic for a follow-up visit. Patient educated on when it is appropriate to go to the emergency department.   Kattie Parrot, FNP-C [facility] [phone]

## 2024-06-01 NOTE — Patient Instructions (Signed)

## 2024-06-02 ENCOUNTER — Ambulatory Visit: Payer: Self-pay | Admitting: Family Medicine

## 2024-06-02 LAB — LIPID PANEL
Chol/HDL Ratio: 5.5 ratio — ABNORMAL HIGH (ref 0.0–4.4)
Cholesterol, Total: 241 mg/dL — ABNORMAL HIGH (ref 100–199)
HDL: 44 mg/dL (ref >39)
LDL Chol Calc (NIH): 172 mg/dL — ABNORMAL HIGH (ref 0–99)
Triglycerides: 135 mg/dL (ref 0–149)
VLDL Cholesterol Cal: 25 mg/dL (ref 5–40)

## 2024-06-24 DIAGNOSIS — G8929 Other chronic pain: Secondary | ICD-10-CM | POA: Diagnosis not present

## 2024-06-24 DIAGNOSIS — M25562 Pain in left knee: Secondary | ICD-10-CM | POA: Diagnosis not present

## 2024-06-24 DIAGNOSIS — M1712 Unilateral primary osteoarthritis, left knee: Secondary | ICD-10-CM | POA: Diagnosis not present

## 2024-06-24 DIAGNOSIS — M2392 Unspecified internal derangement of left knee: Secondary | ICD-10-CM | POA: Diagnosis not present

## 2024-07-07 ENCOUNTER — Other Ambulatory Visit: Payer: Self-pay | Admitting: Orthopedic Surgery

## 2024-07-07 DIAGNOSIS — M1712 Unilateral primary osteoarthritis, left knee: Secondary | ICD-10-CM

## 2024-07-07 DIAGNOSIS — M2392 Unspecified internal derangement of left knee: Secondary | ICD-10-CM

## 2024-07-07 DIAGNOSIS — G8929 Other chronic pain: Secondary | ICD-10-CM

## 2024-07-10 ENCOUNTER — Ambulatory Visit
Admission: RE | Admit: 2024-07-10 | Discharge: 2024-07-10 | Disposition: A | Discharge status: Home / Self Care | Source: Ambulatory Visit | Attending: Orthopedic Surgery | Admitting: Orthopedic Surgery

## 2024-07-10 DIAGNOSIS — G8929 Other chronic pain: Secondary | ICD-10-CM

## 2024-07-10 DIAGNOSIS — M25562 Pain in left knee: Secondary | ICD-10-CM | POA: Diagnosis not present

## 2024-07-10 DIAGNOSIS — M2392 Unspecified internal derangement of left knee: Secondary | ICD-10-CM

## 2024-07-10 DIAGNOSIS — M1712 Unilateral primary osteoarthritis, left knee: Secondary | ICD-10-CM

## 2024-07-12 DIAGNOSIS — E66813 Obesity, class 3: Secondary | ICD-10-CM | POA: Diagnosis not present

## 2024-07-12 DIAGNOSIS — Z6841 Body Mass Index (BMI) 40.0 and over, adult: Secondary | ICD-10-CM | POA: Diagnosis not present

## 2024-07-12 DIAGNOSIS — M1712 Unilateral primary osteoarthritis, left knee: Secondary | ICD-10-CM | POA: Diagnosis not present

## 2024-07-12 DIAGNOSIS — Z4822 Encounter for aftercare following kidney transplant: Secondary | ICD-10-CM | POA: Diagnosis not present

## 2024-07-12 DIAGNOSIS — M25562 Pain in left knee: Secondary | ICD-10-CM | POA: Diagnosis not present

## 2024-07-12 DIAGNOSIS — G8929 Other chronic pain: Secondary | ICD-10-CM | POA: Diagnosis not present

## 2024-07-21 ENCOUNTER — Ambulatory Visit: Admitting: Family Medicine

## 2024-07-21 ENCOUNTER — Encounter: Payer: Self-pay | Admitting: Family Medicine

## 2024-07-21 VITALS — BP 135/82 | HR 66 | Temp 97.7°F | Ht 65.0 in | Wt 248.0 lb

## 2024-07-21 DIAGNOSIS — Z7985 Long-term (current) use of injectable non-insulin antidiabetic drugs: Secondary | ICD-10-CM

## 2024-07-21 DIAGNOSIS — Z6841 Body Mass Index (BMI) 40.0 and over, adult: Secondary | ICD-10-CM

## 2024-07-21 DIAGNOSIS — M1712 Unilateral primary osteoarthritis, left knee: Secondary | ICD-10-CM | POA: Diagnosis not present

## 2024-07-21 DIAGNOSIS — E1159 Type 2 diabetes mellitus with other circulatory complications: Secondary | ICD-10-CM | POA: Diagnosis not present

## 2024-07-21 DIAGNOSIS — E1169 Type 2 diabetes mellitus with other specified complication: Secondary | ICD-10-CM

## 2024-07-21 DIAGNOSIS — I152 Hypertension secondary to endocrine disorders: Secondary | ICD-10-CM | POA: Diagnosis not present

## 2024-07-21 NOTE — Progress Notes (Signed)
 Office: [phone]  /  Fax: [phone]   Initial Visit  Paula Butler was seen in clinic today to evaluate for obesity. She is interested in losing weight to improve overall health and reduce the risk of weight related complications. She presents today to review program treatment options, initial physical assessment, and evaluation.     She was referred by: Specialist  When asked what else they would like to accomplish? She states: Adopt a healthier eating pattern and lifestyle, Improve energy levels and physical activity, Improve existing medical conditions, Reduce number of medications, and Improve quality of life  Weight history:  weight started to go up with depression.  She would like to get to 160 lb.  She is up to 285 lb a year.  She has cut back on carbs and sugar. She lives at home with her daughter and her husband (he is the source of her stress)  When asked how has your weight affected you? She states: Contributed to medical problems and Contributed to orthopedic problems or mobility issues  Some associated conditions: Hypertension, Arthritis:L>R knee, MASLD, and Diabetes  Contributing factors: family history of obesity, moderate to high levels of stress, reduced physical activity, and menopause  Weight promoting medications identified: None  Current nutrition plan: None  Current level of physical activity: None  Current or previous pharmacotherapy: GLP-1 and Metformin   Response to medication: Had side effects so it was discontinued Metformin  caused GI upset Trulicity  doing OK with  Past medical history includes:   Past Medical History:  Diagnosis Date   Diabetes mellitus without complication (HCC)    Hyperlipidemia    Hypertension      Objective:   BP 135/82   Pulse 66   Temp 97.7 F (36.5 C)   Ht 5' 5 (1.651 m)   Wt 248 lb (112.5 kg)   LMP  (LMP Unknown)   SpO2 97%   BMI 41.27 kg/m  She was weighed on the bioimpedance scale: Body mass index  is 41.27 kg/m.  Peak Weight:285 , Body Fat%:52, Visceral Fat Rating:16, Weight trend over the last 12 months: Decreasing  General:  Alert, oriented and cooperative. Patient is in no acute distress.  Respiratory: Normal respiratory effort, no problems with respiration noted   Gait: able to ambulate independently  Mental Status: Normal mood and affect. Normal behavior. Normal judgment and thought content.   DIAGNOSTIC DATA REVIEWED:  BMET    Component Value Date/Time   NA 141 02/24/2024 0808   NA 143 05/13/2023 1243   K 3.9 02/24/2024 0808   CL 105 02/24/2024 0808   CO2 27 02/24/2024 0808   GLUCOSE 93 02/24/2024 0808   BUN 12 02/24/2024 0808   BUN 18 05/13/2023 1243   CREATININE 0.79 02/24/2024 0808   CREATININE 0.97 02/27/2018 1219   CALCIUM  9.2 02/24/2024 0808   GFRNONAA >60 02/24/2024 0808   GFRNONAA 71 02/27/2018 1219   GFRAA >60 05/25/2018 0814   GFRAA 82 02/27/2018 1219   Lab Results  Component Value Date   HGBA1C 5.4 06/01/2024   HGBA1C 5.9 (H) 02/27/2018   No results found for: INSULIN CBC    Component Value Date/Time   WBC 4.6 02/24/2024 0808   RBC 4.63 02/24/2024 0808   HGB 14.2 02/24/2024 0808   HGB 14.0 04/01/2023 1041   HCT 41.7 02/24/2024 0808   HCT 41.5 04/01/2023 1041   PLT 130 (L) 02/24/2024 0808   PLT 137 (L) 04/01/2023 1041   MCV 90.1 02/24/2024 0808  MCV 89 04/01/2023 1041   MCH 30.7 02/24/2024 0808   MCHC 34.1 02/24/2024 0808   RDW 13.7 02/24/2024 0808   RDW 13.1 04/01/2023 1041   Iron/TIBC/Ferritin/ %Sat No results found for: IRON, TIBC, FERRITIN, IRONPCTSAT Lipid Panel     Component Value Date/Time   CHOL 241 (H) 06/01/2024 1455   TRIG 135 06/01/2024 1455   HDL 44 06/01/2024 1455   CHOLHDL 5.5 (H) 06/01/2024 1455   CHOLHDL 5.6 (H) 03/24/2018 1240   LDLCALC 172 (H) 06/01/2024 1455   LDLCALC 126 (H) 03/24/2018 1240   Hepatic Function Panel     Component Value Date/Time   PROT 7.1 02/24/2024 0808   PROT 6.8 04/01/2023  1041   ALBUMIN 3.6 02/24/2024 0808   ALBUMIN 4.0 04/01/2023 1041   AST 36 02/24/2024 0808   ALT 66 (H) 02/24/2024 0808   ALKPHOS 79 02/24/2024 0808   BILITOT 0.6 02/24/2024 0808   BILITOT 0.3 04/01/2023 1041   BILIDIR 0.1 03/24/2018 1240   IBILI 0.4 03/24/2018 1240      Component Value Date/Time   TSH 1.850 02/25/2024 1147     Assessment and Plan:   Type 2 diabetes mellitus with other specified complication, without long-term current use of insulin (HCC) She is on Trulicity  4.5 mg weekly and is tolerating this well Hx of GI upset from Metformin  Look for improvements next visit  Morbid obesity with BMI of 40.0-44.9, adult (HCC)  Hypertension associated with diabetes (HCC) BP is fairly well controlled on lisinopril  20 mg daily Look for improvements after weight reduction  Primary osteoarthritis of left knee Referred from ortho re: L knee DJD, needing weight loss to help relieve knee pain Has limited exercise due to pain Consider water exercise, weight training, etc Info given on [facility] (Right Start weight training program)    Obesity Treatment / Action Plan:  Patient will work on garnering support from family and friends to begin weight loss journey. Will work on eliminating or reducing the presence of highly palatable, calorie dense foods in the home. Will complete provided nutritional and psychosocial assessment questionnaire before the next appointment. Will be scheduled for indirect calorimetry to determine resting energy expenditure in a fasting state.  This will allow us  to create a reduced calorie, high-protein meal plan to promote loss of fat mass while preserving muscle mass. Will think about ideas on how to incorporate physical activity into their daily routine. Counseled on the health benefits of losing 5%-15% of total body weight. Was counseled on nutritional approaches to weight loss and benefits of reducing processed foods and consuming  plant-based foods and high quality protein as part of nutritional weight management. Was counseled on pharmacotherapy and role as an adjunct in weight management.   Obesity Education Performed Today:  She was weighed on the bioimpedance scale and results were discussed and documented in the synopsis.  We discussed obesity as a disease and the importance of a more detailed evaluation of all the factors contributing to the disease.  We discussed the importance of long term lifestyle changes which include nutrition, exercise and behavioral modifications as well as the importance of customizing this to her specific health and social needs.  We discussed the benefits of reaching a healthier weight to alleviate the symptoms of existing conditions and reduce the risks of the biomechanical, metabolic and psychological effects of obesity.  Paula Butler appears to be in the action stage of change and states they are ready to start intensive  lifestyle modifications and behavioral modifications.  22 minutes was spent today on this visit including the above counseling, pre-visit chart review, and post-visit documentation.  Reviewed by clinician on day of visit: allergies, medications, problem list, medical history, surgical history, family history, social history, and previous encounter notes pertinent to obesity diagnosis.    Darice Haddock, D.O. DABFM, [facility] [address] [place], [place] [address] [phone]

## 2024-08-10 ENCOUNTER — Encounter: Payer: Self-pay | Admitting: *Deleted

## 2024-08-26 ENCOUNTER — Inpatient Hospital Stay: Attending: Physician Assistant

## 2024-08-26 DIAGNOSIS — D696 Thrombocytopenia, unspecified: Secondary | ICD-10-CM | POA: Insufficient documentation

## 2024-08-26 LAB — CBC
HCT: 43.1 % (ref 36.0–46.0)
Hemoglobin: 14.5 g/dL (ref 12.0–15.0)
MCH: 30.9 pg (ref 26.0–34.0)
MCHC: 33.6 g/dL (ref 30.0–36.0)
MCV: 91.7 fL (ref 80.0–100.0)
Platelets: 133 K/uL — ABNORMAL LOW (ref 150–400)
RBC: 4.7 MIL/uL (ref 3.87–5.11)
RDW: 13.2 % (ref 11.5–15.5)
WBC: 6.5 K/uL (ref 4.0–10.5)
nRBC: 0 % (ref 0.0–0.2)

## 2024-09-01 ENCOUNTER — Ambulatory Visit: Admitting: Family Medicine

## 2024-09-02 ENCOUNTER — Ambulatory Visit (INDEPENDENT_AMBULATORY_CARE_PROVIDER_SITE_OTHER): Admitting: Family Medicine

## 2024-09-02 ENCOUNTER — Encounter: Payer: Self-pay | Admitting: Family Medicine

## 2024-09-02 VITALS — BP 131/82 | HR 79 | Temp 97.7°F | Ht 65.0 in | Wt 245.2 lb

## 2024-09-02 DIAGNOSIS — E785 Hyperlipidemia, unspecified: Secondary | ICD-10-CM

## 2024-09-02 DIAGNOSIS — M1712 Unilateral primary osteoarthritis, left knee: Secondary | ICD-10-CM

## 2024-09-02 DIAGNOSIS — E1169 Type 2 diabetes mellitus with other specified complication: Secondary | ICD-10-CM | POA: Diagnosis not present

## 2024-09-02 DIAGNOSIS — I152 Hypertension secondary to endocrine disorders: Secondary | ICD-10-CM | POA: Diagnosis not present

## 2024-09-02 DIAGNOSIS — F321 Major depressive disorder, single episode, moderate: Secondary | ICD-10-CM | POA: Diagnosis not present

## 2024-09-02 DIAGNOSIS — Z6841 Body Mass Index (BMI) 40.0 and over, adult: Secondary | ICD-10-CM | POA: Diagnosis not present

## 2024-09-02 DIAGNOSIS — K760 Fatty (change of) liver, not elsewhere classified: Secondary | ICD-10-CM

## 2024-09-02 DIAGNOSIS — Z7984 Long term (current) use of oral hypoglycemic drugs: Secondary | ICD-10-CM | POA: Diagnosis not present

## 2024-09-02 DIAGNOSIS — Z1231 Encounter for screening mammogram for malignant neoplasm of breast: Secondary | ICD-10-CM

## 2024-09-02 DIAGNOSIS — E1159 Type 2 diabetes mellitus with other circulatory complications: Secondary | ICD-10-CM

## 2024-09-02 LAB — BAYER DCA HB A1C WAIVED: HB A1C (BAYER DCA - WAIVED): 5 % (ref 4.8–5.6)

## 2024-09-02 MED ORDER — SERTRALINE HCL 100 MG PO TABS: 100.0000 mg | ORAL_TABLET | Freq: Every day | ORAL | 5 refills | Status: AC

## 2024-09-02 MED ORDER — SEMAGLUTIDE (2 MG/DOSE) 8 MG/3ML ~~LOC~~ SOPN: 2.0000 mg | PEN_INJECTOR | SUBCUTANEOUS | 3 refills | Status: DC

## 2024-09-02 NOTE — Progress Notes (Signed)
 Subjective:  Patient ID: Paula Butler, female    DOB: 11-27-1971, 53 y.o.   MRN: [id number]  Patient Care Team: Severa Rock HERO, FNP as PCP - General (Family Medicine) Cindie Carlin POUR, DO as Consulting Physician (Gastroenterology)   Chief Complaint:  Diabetes   HPI: Paula Butler is a 53 y.o. female presenting on 09/02/2024 for Diabetes    Paula Butler is a 53 year old female with diabetes who presents for a follow-up visit.  Blood sugars have been stable with an A1c of 5.0. She is currently on Trulicity  4.5 mg weekly. She previously attempted to switch to Ozempic , but insurance coverage was an issue. She is interested in trying Ozempic  again for potential weight loss benefits. No side effects from Trulicity .  Her BMI is 40.8, and she has been participating in healthy weight management programs. She reports that she has tears in her meniscus based on prior MRI results and experiences significant knee pain, which sometimes affects her sleep. She is currently taking Robaxin , which is not effective, and Tylenol  Arthritis, taking three tablets daily. She has not yet received a response from Dr. Heyward regarding her pain management and has not pursued joint injections.  She is managing hypertension with lisinopril  and hyperlipidemia with pravastatin  without issues. Additionally, she is on Zoloft  for anxiety and depression, taking 100 mg daily, which she has increased from 50 mg due to recent stressors. She takes two 50 mg pills once a day.  She reports knee swelling and significant pain in her left knee, which sometimes disrupts her sleep.         09/02/2024    3:05 PM 06/01/2024    2:49 PM 04/20/2024   10:58 AM 02/25/2024   11:40 AM 11/18/2023   10:37 AM  Depression screen PHQ 2/9  Decreased Interest 0 1 1 0 1  Down, Depressed, Hopeless 1 1 1 1 2   PHQ - 2 Score 1 2 2 1 3   Altered sleeping 2 0 1 2 1   Tired, decreased energy 1 1 1 1 1   Change in appetite 0 0 1 1 1    Feeling bad or failure about yourself  1 1 0 0 0  Trouble concentrating 1 1 2 1 1   Moving slowly or fidgety/restless 0 0 0 0 0  Suicidal thoughts 0 0 0 0 0  PHQ-9 Score 6 5 7 6 7   Difficult doing work/chores Somewhat difficult Somewhat difficult Somewhat difficult Not difficult at all Somewhat difficult      09/02/2024    3:06 PM 06/01/2024    2:49 PM 04/20/2024   10:59 AM 02/25/2024   11:40 AM  GAD 7 : Generalized Anxiety Score  Nervous, Anxious, on Edge 1 1 0 1  Control/stop worrying 1 1 1 1   Worry too much - different things 1 1 1  0  Trouble relaxing 1 1 1 1   Restless 0 0 1 0  Easily annoyed or irritable 2 2 2 2   Afraid - awful might happen 1 0 1 1  Total GAD 7 Score 7 6 7 6   Anxiety Difficulty Somewhat difficult Somewhat difficult Somewhat difficult Not difficult at all        Relevant past medical, surgical, family, and social history reviewed and updated as indicated.  Allergies and medications reviewed and updated. Data reviewed: Chart in Epic.   Past Medical History:  Diagnosis Date   Diabetes mellitus without complication (HCC)    Hyperlipidemia    Hypertension  Past Surgical History:  Procedure Laterality Date   CHOLECYSTECTOMY     ESOPHAGOGASTRODUODENOSCOPY  06/14/2009   Dr. Angelena; mild esophagitis biopsied, small gastric ulcer-healing s/p biopsied, gastritis biopsied, mild duodenitis biopsied.  Esophageal biopsy unremarkable, gastric and duodenal biopsy with no significant histopathologic features.   EXCISION OF BREAST BIOPSY Left 05/29/2018   fibrocystic changes retroareolar excisional bx   TUBAL LIGATION      Social History   Socioeconomic History   Marital status: Married    Spouse name: Jose   Number of children: 3   Years of education: Not on file   Highest education level: Not on file  Occupational History   Occupation: disability  Tobacco Use   Smoking status: Never   Smokeless tobacco: Never  Vaping Use   Vaping status: Never Used   Substance and Sexual Activity   Alcohol  use: No   Drug use: No   Sexual activity: Not Currently    Partners: Male    Birth control/protection: Surgical    Comment: tubal  Other Topics Concern   Not on file  Social History Narrative   Married for 10 years, 3 children, all grown/teenagers.    2 daughters, 1 son - children live with her   Attends church.    Walks for exercise.   Enjoys singing.   Eats all food groups.   Wears seatbelt.   Born in [place], grew up in [place] . Father in [employer]. Grandmother raised her.    Moved from [place]  in 2018.    Does not work out side of home.    Social Drivers of Corporate investment banker Strain: Low Risk  (11/04/2023)   Overall Financial Resource Strain (CARDIA)    Difficulty of Paying Living Expenses: Not hard at all  Food Insecurity: No Food Insecurity (11/04/2023)   Hunger Vital Sign    Worried About Running Out of Food in the Last Year: Never true    Ran Out of Food in the Last Year: Never true  Transportation Needs: No Transportation Needs (11/04/2023)   PRAPARE - Administrator, Civil Service (Medical): No    Lack of Transportation (Non-Medical): No  Physical Activity: Sufficiently Active (11/04/2023)   Exercise Vital Sign    Days of Exercise per Week: 4 days    Minutes of Exercise per Session: 40 min  Stress: No Stress Concern Present (11/04/2023)   [employer] of Occupational Health - Occupational Stress Questionnaire    Feeling of Stress : Not at all  Social Connections: Moderately Isolated (11/04/2023)   Social Connection and Isolation Panel    Frequency of Communication with Friends and Family: More than three times a week    Frequency of Social Gatherings with Friends and Family: More than three times a week    Attends Religious Services: Never    Database administrator or Organizations: No    Attends Banker Meetings: Never    Marital Status: Married  Catering manager Violence: Not At  Risk (11/04/2023)   Humiliation, Afraid, Rape, and Kick questionnaire    Fear of Current or Ex-Partner: No    Emotionally Abused: No    Physically Abused: No    Sexually Abused: No    Outpatient Encounter Medications as of 09/02/2024  Medication Sig   Blood Glucose Monitoring Suppl DEVI 1 each by Does not apply route in the morning, at noon, and at bedtime. May substitute to any manufacturer covered by patient's insurance.   lisinopril  (ZESTRIL )  20 MG tablet Take 1 tablet (20 mg total) by mouth daily.   methocarbamol  (ROBAXIN ) 500 MG tablet Take 1,000 mg by mouth.   pravastatin  (PRAVACHOL ) 10 MG tablet Take 1 tablet (10 mg total) by mouth daily.   Semaglutide , 2 MG/DOSE, 8 MG/3ML SOPN Inject 2 mg as directed once a week.   sertraline  (ZOLOFT ) 100 MG tablet Take 1 tablet (100 mg total) by mouth daily.   [DISCONTINUED] Dulaglutide  (TRULICITY ) 4.5 MG/0.5ML SOAJ Inject 4.5 mg as directed once a week.   [DISCONTINUED] methylPREDNISolone  (MEDROL  DOSEPAK) 4 MG TBPK tablet Take 4 mg by mouth.   [DISCONTINUED] sertraline  (ZOLOFT ) 50 MG tablet Take 2 tablets (100 mg total) by mouth daily.   No facility-administered encounter medications on file as of 09/02/2024.    Allergies  Allergen Reactions   Fish Allergy Anaphylaxis, Shortness Of Breath and Rash   Shrimp [Shellfish Allergy] Anaphylaxis, Shortness Of Breath and Rash   Asa [Aspirin] Other (See Comments)    Abdominal swelling    Morpholine Salicylate     Other Reaction(s): Other (See Comments)  Abdominal bloating, extremely painful  Other Reaction(s): Other (See Comments)  Abdominal bloating, extremely painful   Penicillins Hives, Itching and Rash    Has patient had a PCN reaction causing immediate rash, facial/tongue/throat swelling, SOB or lightheadedness with hypotension: Yes Has patient had a PCN reaction causing severe rash involving mucus membranes or skin necrosis: No Has patient had a PCN reaction that required  hospitalization: Yes Has patient had a PCN reaction occurring within the last 10 years: Yes If all of the above answers are NO, then may proceed with Cephalosporin use.     Pertinent ROS per HPI, otherwise unremarkable      Objective:  BP 131/82   Pulse 79   Temp 97.7 F (36.5 C)   Ht 5' 5 (1.651 m)   Wt 245 lb 3.2 oz (111.2 kg)   LMP  (LMP Unknown)   SpO2 94%   BMI 40.80 kg/m    Wt Readings from Last 3 Encounters:  09/02/24 245 lb 3.2 oz (111.2 kg)  07/21/24 248 lb (112.5 kg)  06/01/24 257 lb 12.8 oz (116.9 kg)    Physical Exam Vitals and nursing note reviewed.  Constitutional:      General: She is not in acute distress.    Appearance: Normal appearance. She is well-developed and well-groomed. She is morbidly obese. She is not ill-appearing, toxic-appearing or diaphoretic.  HENT:     Head: Normocephalic and atraumatic.     Jaw: There is normal jaw occlusion.     Right Ear: Hearing normal.     Left Ear: Hearing normal.     Nose: Nose normal.     Mouth/Throat:     Lips: Pink.     Mouth: Mucous membranes are moist.     Pharynx: Oropharynx is clear. Uvula midline.  Eyes:     General: Lids are normal.     Extraocular Movements: Extraocular movements intact.     Conjunctiva/sclera: Conjunctivae normal.     Pupils: Pupils are equal, round, and reactive to light.  Neck:     Thyroid : No thyroid  mass, thyromegaly or thyroid  tenderness.     Vascular: No carotid bruit or JVD.     Trachea: Trachea and phonation normal.  Cardiovascular:     Rate and Rhythm: Normal rate and regular rhythm.     Chest Wall: PMI is not displaced.     Pulses: Normal pulses.  Heart sounds: Normal heart sounds. No murmur heard.    No friction rub. No gallop.  Pulmonary:     Effort: Pulmonary effort is normal. No respiratory distress.     Breath sounds: Normal breath sounds. No wheezing.  Abdominal:     General: Bowel sounds are normal. There is no distension or abdominal bruit.      Palpations: Abdomen is soft. There is no hepatomegaly or splenomegaly.     Tenderness: There is no abdominal tenderness. There is no right CVA tenderness or left CVA tenderness.     Hernia: No hernia is present.  Musculoskeletal:        General: Normal range of motion.     Cervical back: Normal range of motion and neck supple.     Left upper leg: Normal.     Left knee: Swelling present. Tenderness present.     Right lower leg: No edema.     Left lower leg: Normal. No edema.  Lymphadenopathy:     Cervical: No cervical adenopathy.  Skin:    General: Skin is warm and dry.     Capillary Refill: Capillary refill takes less than 2 seconds.     Coloration: Skin is not cyanotic, jaundiced or pale.     Findings: No rash.  Neurological:     General: No focal deficit present.     Mental Status: She is alert and oriented to person, place, and time.     Sensory: Sensation is intact.     Motor: Motor function is intact.     Coordination: Coordination is intact.     Gait: Gait is intact.     Deep Tendon Reflexes: Reflexes are normal and symmetric.  Psychiatric:        Attention and Perception: Attention and perception normal.        Mood and Affect: Mood and affect normal.        Speech: Speech normal.        Behavior: Behavior normal. Behavior is cooperative.        Thought Content: Thought content normal.        Cognition and Memory: Cognition and memory normal.        Judgment: Judgment normal.      Results for orders placed or performed in visit on 08/26/24  CBC   Collection Time: 08/26/24  2:56 PM  Result Value Ref Range   WBC 6.5 4.0 - 10.5 K/uL   RBC 4.70 3.87 - 5.11 MIL/uL   Hemoglobin 14.5 12.0 - 15.0 g/dL   HCT 56.8 63.9 - 53.9 %   MCV 91.7 80.0 - 100.0 fL   MCH 30.9 26.0 - 34.0 pg   MCHC 33.6 30.0 - 36.0 g/dL   RDW 86.7 88.4 - 84.4 %   Platelets 133 (L) 150 - 400 K/uL   nRBC 0.0 0.0 - 0.2 %       Pertinent labs & imaging results that were available during my care of  the patient were reviewed by me and considered in my medical decision making.  Assessment & Plan:  Mkayla was seen today for diabetes.  Diagnoses and all orders for this visit:  Type 2 diabetes mellitus with other specified complication, without long-term current use of insulin (HCC) -     Bayer DCA Hb A1c Waived -     Thyroid  Panel With TSH -     Semaglutide , 2 MG/DOSE, 8 MG/3ML SOPN; Inject 2 mg as directed once a week.  Hypertension associated  with diabetes (HCC) -     Semaglutide , 2 MG/DOSE, 8 MG/3ML SOPN; Inject 2 mg as directed once a week.  Hyperlipidemia associated with type 2 diabetes mellitus (HCC) -     Lipid panel -     Semaglutide , 2 MG/DOSE, 8 MG/3ML SOPN; Inject 2 mg as directed once a week.  Morbid obesity with BMI of 40.0-44.9, adult (HCC) -     Semaglutide , 2 MG/DOSE, 8 MG/3ML SOPN; Inject 2 mg as directed once a week.  Hepatic steatosis -     Semaglutide , 2 MG/DOSE, 8 MG/3ML SOPN; Inject 2 mg as directed once a week.  Depression, major, single episode, moderate (HCC) -     sertraline  (ZOLOFT ) 100 MG tablet; Take 1 tablet (100 mg total) by mouth daily.  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 Pain is not well-managed with current medication regimen. Surgery is recommended but contingent on weight loss. - Follow up with Dr. Heyward regarding pain management and potential joint injection - Continue Tylenol  Arthritis as needed for pain - Apply ice and wrap the knee for support - Complete current course of prednisone   Obesity Obesity with a BMI of 40.8. Weight loss is recommended to facilitate knee surgery. She is engaged in a weight management program. A BMI under 40 is typically required for joint surgeries. - Continue weight management and exercise  program - Monitor progress towards BMI goal of under 40 for knee surgery eligibility  Type 2 diabetes mellitus Type 2 diabetes mellitus is currently well-controlled with an A1c of 5.0. She is on Trulicity  4.5 mg weekly and is considering switching to Ozempic  for better weight management outcomes. Insurance coverage for Ozempic  has been an issue in the past, but will be attempted again. Both medications are in the same class, minimizing the risk of significant side effects from switching. - Switch from Trulicity  to Ozempic , starting at 2 mg weekly, to be taken at the next scheduled Trulicity  dose - Send prescription for Ozempic  to [facility] pharmacy - Monitor for side effects, particularly constipation, and report any significant issues  Hypertension Hypertension is slightly above target with a recent reading of 131/82 mmHg. She is on lisinopril  and advised to monitor blood pressure closely. Limiting salt intake is recommended to help reduce blood pressure. - Continue lisinopril  - Advise to limit salt intake to help reduce blood pressure - Monitor blood pressure regularly aiming for 130/80 mmHg or below  Hyperlipidemia Hyperlipidemia is currently managed with pravastatin . - Continue pravastatin   Depression and anxiety Depression and anxiety are currently managed with Zoloft . She has increased the dose to 100 mg daily due to increased stress. - Increase Zoloft  prescription to 100 mg daily - Send prescription for 100 mg Zoloft  tablets to pharmacy          Continue all other maintenance medications.  Follow up plan: Return in about 3 months (around 12/02/2024) for DM.   Continue healthy lifestyle choices, including diet (rich in fruits, vegetables, and lean proteins, and low in salt and simple carbohydrates) and exercise (at least 30 minutes of moderate physical activity daily).  Educational handout given for DM, DASH  The above assessment and management plan was discussed with  the patient. The patient verbalized understanding of and has agreed to the  management plan. Patient is aware to call the clinic if they develop any new symptoms or if symptoms persist or worsen. Patient is aware when to return to the clinic for a follow-up visit. Patient educated on when it is appropriate to go to the emergency department.   Rosaline Bruns, FNP-C [facility] [phone]

## 2024-09-02 NOTE — Patient Instructions (Addendum)
 Continue to monitor your blood sugars as we discussed and record them. Bring the log to your next appointment.  Take your medications as directed.    Goal Blood glucose:    Fasting (before meals) = 80 to 130   Within 2 hours of eating = less than 180   Understanding your Hemoglobin A1c:     Diabetes Mellitus and Nutrition    I think that you would greatly benefit from seeing a nutritionist. If this is something you are interested in, please call Dr Wonda at [phone] to schedule an appointment.   When you have diabetes (diabetes mellitus), it is very important to have healthy eating habits because your blood sugar (glucose) levels are greatly affected by what you eat and drink. Eating healthy foods in the appropriate amounts, at about the same times every day, can help you: Control your blood glucose. Lower your risk of heart disease. Improve your blood pressure. Reach or maintain a healthy weight.  Every person with diabetes is different, and each person has different needs for a meal plan. Your health care provider may recommend that you work with a diet and nutrition specialist (dietitian) to make a meal plan that is best for you. Your meal plan may vary depending on factors such as: The calories you need. The medicines you take. Your weight. Your blood glucose, blood pressure, and cholesterol levels. Your activity level. Other health conditions you have, such as heart or kidney disease.  How do carbohydrates affect me? Carbohydrates affect your blood glucose level more than any other type of food. Eating carbohydrates naturally increases the amount of glucose in your blood. Carbohydrate counting is a method for keeping track of how many carbohydrates you eat. Counting carbohydrates is important to keep your blood glucose at a healthy level, especially if you use insulin or take certain oral diabetes medicines. It is important to know how many carbohydrates you can safely  have in each meal. This is different for every person. Your dietitian can help you calculate how many carbohydrates you should have at each meal and for snack. Foods that contain carbohydrates include: Bread, cereal, rice, pasta, and crackers. Potatoes and corn. Peas, beans, and lentils. Milk and yogurt. Fruit and juice. Desserts, such as cakes, cookies, ice cream, and candy.  How does alcohol  affect me? Alcohol  can cause a sudden decrease in blood glucose (hypoglycemia), especially if you use insulin or take certain oral diabetes medicines. Hypoglycemia can be a life-threatening condition. Symptoms of hypoglycemia (sleepiness, dizziness, and confusion) are similar to symptoms of having too much alcohol . If your health care provider says that alcohol  is safe for you, follow these guidelines: Limit alcohol  intake to no more than 1 drink per day for nonpregnant women and 2 drinks per day for men. One drink equals 12 oz of beer, 5 oz of wine, or 1 oz of hard liquor. Do not drink on an empty stomach. Keep yourself hydrated with water, diet soda, or unsweetened iced tea. Keep in mind that regular soda, juice, and other mixers may contain a lot of sugar and must be counted as carbohydrates.  What are tips for following this plan?  Reading food labels Start by checking the serving size on the label. The amount of calories, carbohydrates, fats, and other nutrients listed on the label are based on one serving of the food. Many foods contain more than one serving per package. Check the total grams (g) of carbohydrates in one serving. You can calculate the  number of servings of carbohydrates in one serving by dividing the total carbohydrates by 15. For example, if a food has 30 g of total carbohydrates, it would be equal to 2 servings of carbohydrates. Check the number of grams (g) of saturated and trans fats in one serving. Choose foods that have low or no amount of these fats. Check the number of  milligrams (mg) of sodium in one serving. Most people should limit total sodium intake to less than 2,300 mg per day. Always check the nutrition information of foods labeled as low-fat or nonfat. These foods may be higher in added sugar or refined carbohydrates and should be avoided. Talk to your dietitian to identify your daily goals for nutrients listed on the label.  Shopping Avoid buying canned, premade, or processed foods. These foods tend to be high in fat, sodium, and added sugar. Shop around the outside edge of the grocery store. This includes fresh fruits and vegetables, bulk grains, fresh meats, and fresh dairy.  Cooking Use low-heat cooking methods, such as baking, instead of high-heat cooking methods like deep frying. Cook using healthy oils, such as olive, canola, or sunflower oil. Avoid cooking with butter, cream, or high-fat meats.  Meal planning Eat meals and snacks regularly, preferably at the same times every day. Avoid going long periods of time without eating. Eat foods high in fiber, such as fresh fruits, vegetables, beans, and whole grains. Talk to your dietitian about how many servings of carbohydrates you can eat at each meal. Eat 4-6 ounces of lean protein each day, such as lean meat, chicken, fish, eggs, or tofu. 1 ounce is equal to 1 ounce of meat, chicken, or fish, 1 egg, or 1/4 cup of tofu. Eat some foods each day that contain healthy fats, such as avocado, nuts, seeds, and fish.  Lifestyle  Check your blood glucose regularly. Exercise at least 30 minutes 5 or more days each week, or as told by your health care provider. Take medicines as told by your health care provider. Do not use any products that contain nicotine or tobacco, such as cigarettes and e-cigarettes. If you need help quitting, ask your health care provider. Work with a Veterinary surgeon or diabetes educator to identify strategies to manage stress and any emotional and social challenges.  What are  some questions to ask my health care provider? Do I need to meet with a diabetes educator? Do I need to meet with a dietitian? What number can I call if I have questions? When are the best times to check my blood glucose?  Where to find more information: American Diabetes Association: diabetes.org/food-and-fitness/food Academy of Nutrition and Dietetics: https://www.vargas.com/ [employer] of Diabetes and Digestive and Kidney Diseases (NIH): FindJewelers.cz  Summary A healthy meal plan will help you control your blood glucose and maintain a healthy lifestyle. Working with a diet and nutrition specialist (dietitian) can help you make a meal plan that is best for you. Keep in mind that carbohydrates and alcohol  have immediate effects on your blood glucose levels. It is important to count carbohydrates and to use alcohol  carefully. This information is not intended to replace advice given to you by your health care provider. Make sure you discuss any questions you have with your health care provider. Document Released: 08/29/2005 Document Revised: 01/06/2017 Document Reviewed: 01/06/2017 Elsevier Interactive Patient Education  2018 [employer]vier Inc.   Goal BP:  Less than 130/80  Take your medications faithfully as prescribed. Maintain a healthy weight. Get at least 150  minutes of aerobic exercise per week. Minimize salt intake, less than 2000 mg per day. Minimize alcohol  intake.  DASH Eating Plan DASH stands for Dietary Approaches to Stop Hypertension. The DASH eating plan is a healthy eating plan that has been shown to reduce high blood pressure (hypertension). Additional health benefits may include reducing the risk of type 2 diabetes mellitus, heart disease, and stroke. The DASH eating plan may also help with weight loss.  WHAT DO I NEED TO KNOW ABOUT THE DASH EATING  PLAN? For the DASH eating plan, you will follow these general guidelines: Choose foods with a percent daily value for sodium of less than 5% (as listed on the food label). Use salt-free seasonings or herbs instead of table salt or sea salt. Check with your health care provider or pharmacist before using salt substitutes. Eat lower-sodium products, often labeled as lower sodium or no salt added. Eat fresh foods. Eat more vegetables, fruits, and low-fat dairy products. Choose whole grains. Look for the word whole as the first word in the ingredient list. Choose fish and skinless chicken or [place] more often than red meat. Limit fish, poultry, and meat to 6 oz (170 g) each day. Limit sweets, desserts, sugars, and sugary drinks. Choose heart-healthy fats. Limit cheese to 1 oz (28 g) per day. Eat more home-cooked food and less restaurant, buffet, and fast food. Limit fried foods. Cook foods using methods other than frying. Limit canned vegetables. If you do use them, rinse them well to decrease the sodium. When eating at a restaurant, ask that your food be prepared with less salt, or no salt if possible.  WHAT FOODS CAN I EAT? Seek help from a dietitian for individual calorie needs.  Grains Whole grain or whole wheat bread. Brown rice. Whole grain or whole wheat pasta. Quinoa, bulgur, and whole grain cereals. Low-sodium cereals. Corn or whole wheat flour tortillas. Whole grain cornbread. Whole grain crackers. Low-sodium crackers.  Vegetables Fresh or frozen vegetables (raw, steamed, roasted, or grilled). Low-sodium or reduced-sodium tomato and vegetable juices. Low-sodium or reduced-sodium tomato sauce and paste. Low-sodium or reduced-sodium canned vegetables.   Fruits All fresh, canned (in natural juice), or frozen fruits.  Meat and Other Protein Products Ground beef (85% or leaner), grass-fed beef, or beef trimmed of fat. Skinless chicken or [place]. Ground chicken or [place]. Pork  trimmed of fat. All fish and seafood. Eggs. Dried beans, peas, or lentils. Unsalted nuts and seeds. Unsalted canned beans.  Dairy Low-fat dairy products, such as skim or 1% milk, 2% or reduced-fat cheeses, low-fat ricotta or cottage cheese, or plain low-fat yogurt. Low-sodium or reduced-sodium cheeses.  Fats and Oils Tub margarines without trans fats. Light or reduced-fat mayonnaise and salad dressings (reduced sodium). Avocado. Safflower, olive, or canola oils. Natural peanut or almond butter.  Other Unsalted popcorn and pretzels. The items listed above may not be a complete list of recommended foods or beverages. Contact your dietitian for more options.  WHAT FOODS ARE NOT RECOMMENDED?  Grains White bread. White pasta. White rice. Refined cornbread. Bagels and croissants. Crackers that contain trans fat.  Vegetables Creamed or fried vegetables. Vegetables in a cheese sauce. Regular canned vegetables. Regular canned tomato sauce and paste. Regular tomato and vegetable juices.  Fruits Dried fruits. Canned fruit in light or heavy syrup. Fruit juice.  Meat and Other Protein Products Fatty cuts of meat. Ribs, chicken wings, bacon, sausage, bologna, salami, chitterlings, fatback, hot dogs, bratwurst, and packaged luncheon meats. Salted nuts and seeds. Canned  beans with salt.  Dairy Whole or 2% milk, cream, half-and-half, and cream cheese. Whole-fat or sweetened yogurt. Full-fat cheeses or blue cheese. Nondairy creamers and whipped toppings. Processed cheese, cheese spreads, or cheese curds.  Condiments Onion and garlic salt, seasoned salt, table salt, and sea salt. Canned and packaged gravies. Worcestershire sauce. Tartar sauce. Barbecue sauce. Teriyaki sauce. Soy sauce, including reduced sodium. Steak sauce. Fish sauce. Oyster sauce. Cocktail sauce. Horseradish. Ketchup and mustard. Meat flavorings and tenderizers. Bouillon cubes. Hot sauce. Tabasco sauce. Marinades. Taco seasonings.  Relishes.  Fats and Oils Butter, stick margarine, lard, shortening, ghee, and bacon fat. Coconut, palm kernel, or palm oils. Regular salad dressings.  Other Pickles and olives. Salted popcorn and pretzels.  The items listed above may not be a complete list of foods and beverages to avoid. Contact your dietitian for more information.  WHERE CAN I FIND MORE INFORMATION? National Heart, Lung, and Blood Institute: CablePromo.it Document Released: 11/21/2011 Document Revised: 04/18/2014 Document Reviewed: 10/06/2013 [facility] Patient Information 2015 [place], [place]. This information is not intended to replace advice given to you by your health care provider. Make sure you discuss any questions you have with your health care provider.   I think that you would greatly benefit from seeing a nutritionist.  If you are interested, please call Dr. Wonda at [phone] to schedule an appointment.

## 2024-09-03 ENCOUNTER — Ambulatory Visit: Payer: Self-pay | Admitting: Family Medicine

## 2024-09-03 LAB — THYROID PANEL WITH TSH
Free Thyroxine Index: 2.2 (ref 1.2–4.9)
T3 Uptake Ratio: 27 % (ref 24–39)
T4, Total: 8.3 ug/dL (ref 4.5–12.0)
TSH: 2.11 u[IU]/mL (ref 0.450–4.500)

## 2024-09-03 LAB — LIPID PANEL
Chol/HDL Ratio: 5.4 ratio — ABNORMAL HIGH (ref 0.0–4.4)
Cholesterol, Total: 223 mg/dL — ABNORMAL HIGH (ref 100–199)
HDL: 41 mg/dL (ref >39)
LDL Chol Calc (NIH): 162 mg/dL — ABNORMAL HIGH (ref 0–99)
Triglycerides: 112 mg/dL (ref 0–149)
VLDL Cholesterol Cal: 20 mg/dL (ref 5–40)

## 2024-11-02 DIAGNOSIS — Z23 Encounter for immunization: Secondary | ICD-10-CM | POA: Diagnosis not present

## 2024-11-16 ENCOUNTER — Emergency Department (HOSPITAL_COMMUNITY)
Admission: EM | Admit: 2024-11-16 | Discharge: 2024-11-16 | Disposition: A | Discharge status: Home / Self Care | Attending: Emergency Medicine | Admitting: Emergency Medicine

## 2024-11-16 ENCOUNTER — Other Ambulatory Visit: Payer: Self-pay

## 2024-11-16 ENCOUNTER — Emergency Department (HOSPITAL_COMMUNITY)

## 2024-11-16 DIAGNOSIS — N83201 Unspecified ovarian cyst, right side: Secondary | ICD-10-CM

## 2024-11-16 DIAGNOSIS — R109 Unspecified abdominal pain: Secondary | ICD-10-CM | POA: Diagnosis not present

## 2024-11-16 DIAGNOSIS — R110 Nausea: Secondary | ICD-10-CM

## 2024-11-16 DIAGNOSIS — R1084 Generalized abdominal pain: Secondary | ICD-10-CM | POA: Diagnosis not present

## 2024-11-16 DIAGNOSIS — Z9049 Acquired absence of other specified parts of digestive tract: Secondary | ICD-10-CM | POA: Diagnosis not present

## 2024-11-16 DIAGNOSIS — R112 Nausea with vomiting, unspecified: Secondary | ICD-10-CM | POA: Diagnosis not present

## 2024-11-16 DIAGNOSIS — Z794 Long term (current) use of insulin: Secondary | ICD-10-CM | POA: Diagnosis not present

## 2024-11-16 DIAGNOSIS — R0781 Pleurodynia: Secondary | ICD-10-CM | POA: Diagnosis not present

## 2024-11-16 LAB — COMPREHENSIVE METABOLIC PANEL WITH GFR
ALT: 15 U/L (ref 0–44)
AST: 24 U/L (ref 15–41)
Albumin: 4.4 g/dL (ref 3.5–5.0)
Alkaline Phosphatase: 71 U/L (ref 38–126)
Anion gap: 14 (ref 5–15)
BUN: 17 mg/dL (ref 6–20)
CO2: 24 mmol/L (ref 22–32)
Calcium: 9.4 mg/dL (ref 8.9–10.3)
Chloride: 107 mmol/L (ref 98–111)
Creatinine, Ser: 0.84 mg/dL (ref 0.44–1.00)
GFR, Estimated: >60 mL/min (ref >60)
Glucose, Bld: 91 mg/dL (ref 70–99)
Potassium: 3.8 mmol/L (ref 3.5–5.1)
Sodium: 145 mmol/L (ref 135–145)
Total Bilirubin: 0.7 mg/dL (ref 0.0–1.2)
Total Protein: 7.4 g/dL (ref 6.5–8.1)

## 2024-11-16 LAB — URINALYSIS, ROUTINE W REFLEX MICROSCOPIC
Bilirubin Urine: NEGATIVE
Glucose, UA: NEGATIVE mg/dL
Hgb urine dipstick: NEGATIVE
Ketones, ur: NEGATIVE mg/dL
Leukocytes,Ua: NEGATIVE
Nitrite: NEGATIVE
Protein, ur: NEGATIVE mg/dL
Specific Gravity, Urine: 1.025 (ref 1.005–1.030)
pH: 5 (ref 5.0–8.0)

## 2024-11-16 LAB — CBC WITH DIFFERENTIAL/PLATELET
Abs Immature Granulocytes: 0.02 K/uL (ref 0.00–0.07)
Basophils Absolute: 0 K/uL (ref 0.0–0.1)
Basophils Relative: 1 %
Eosinophils Absolute: 0.1 K/uL (ref 0.0–0.5)
Eosinophils Relative: 2 %
HCT: 42.4 % (ref 36.0–46.0)
Hemoglobin: 14.1 g/dL (ref 12.0–15.0)
Immature Granulocytes: 0 %
Lymphocytes Relative: 22 %
Lymphs Abs: 1.3 K/uL (ref 0.7–4.0)
MCH: 30.9 pg (ref 26.0–34.0)
MCHC: 33.3 g/dL (ref 30.0–36.0)
MCV: 93 fL (ref 80.0–100.0)
Monocytes Absolute: 0.5 K/uL (ref 0.1–1.0)
Monocytes Relative: 9 %
Neutro Abs: 3.9 K/uL (ref 1.7–7.7)
Neutrophils Relative %: 66 %
Platelets: 147 K/uL — ABNORMAL LOW (ref 150–400)
RBC: 4.56 MIL/uL (ref 3.87–5.11)
RDW: 13.8 % (ref 11.5–15.5)
WBC: 5.9 K/uL (ref 4.0–10.5)
nRBC: 0 % (ref 0.0–0.2)

## 2024-11-16 LAB — TROPONIN T, HIGH SENSITIVITY: Troponin T High Sensitivity: <15 ng/L (ref 0–19)

## 2024-11-16 LAB — LIPASE, BLOOD: Lipase: 40 U/L (ref 11–51)

## 2024-11-16 MED ORDER — ONDANSETRON HCL 4 MG/2ML IJ SOLN
4.0000 mg | Freq: Once | INTRAMUSCULAR | Status: AC
  Administered 2024-11-16: 4 mg via INTRAVENOUS
  Filled 2024-11-16: qty 2

## 2024-11-16 MED ORDER — METOCLOPRAMIDE HCL 5 MG/ML IJ SOLN
10.0000 mg | Freq: Once | INTRAMUSCULAR | Status: AC
  Administered 2024-11-16: 10 mg via INTRAVENOUS
  Filled 2024-11-16: qty 2

## 2024-11-16 MED ORDER — ONDANSETRON 4 MG PO TBDP: 4.0000 mg | ORAL_TABLET | Freq: Three times a day (TID) | ORAL | 0 refills | Status: AC | PRN

## 2024-11-16 MED ORDER — MORPHINE SULFATE (PF) 4 MG/ML IV SOLN
4.0000 mg | Freq: Once | INTRAVENOUS | Status: AC
  Administered 2024-11-16: 4 mg via INTRAVENOUS
  Filled 2024-11-16: qty 1

## 2024-11-16 MED ORDER — DICYCLOMINE HCL 20 MG PO TABS: 20.0000 mg | ORAL_TABLET | Freq: Two times a day (BID) | ORAL | 0 refills | Status: AC

## 2024-11-16 MED ORDER — HYDROMORPHONE HCL 1 MG/ML IJ SOLN
1.0000 mg | Freq: Once | INTRAMUSCULAR | Status: AC
  Administered 2024-11-16: 1 mg via INTRAVENOUS
  Filled 2024-11-16: qty 1

## 2024-11-16 MED ORDER — PANTOPRAZOLE SODIUM 40 MG IV SOLR
40.0000 mg | Freq: Once | INTRAVENOUS | Status: AC
  Administered 2024-11-16: 40 mg via INTRAVENOUS
  Filled 2024-11-16: qty 10

## 2024-11-16 MED ORDER — DIPHENHYDRAMINE HCL 50 MG/ML IJ SOLN
25.0000 mg | Freq: Once | INTRAMUSCULAR | Status: AC
  Administered 2024-11-16: 25 mg via INTRAVENOUS
  Filled 2024-11-16: qty 1

## 2024-11-16 MED ORDER — IOHEXOL 300 MG/ML  SOLN
100.0000 mL | Freq: Once | INTRAMUSCULAR | Status: AC | PRN
  Administered 2024-11-16: 100 mL via INTRAVENOUS

## 2024-11-16 MED ORDER — SODIUM CHLORIDE 0.9 % IV BOLUS
1000.0000 mL | Freq: Once | INTRAVENOUS | Status: AC
  Administered 2024-11-16: 1000 mL via INTRAVENOUS

## 2024-11-16 NOTE — ED Provider Notes (Signed)
  EMERGENCY DEPARTMENT AT [facility] Provider Note   CSN: [id number] Arrival date & time: 11/16/24  1125     Patient presents with: Abdominal Pain   Paula Butler is a 53 y.o. female.   Patient is a 53 year old female who presents emerged department the chief complaint of abdominal pain, nausea, vomiting which has been ongoing since early this morning.  Patient notes that she has had no constipation or diarrhea.  She denies any dysuria or hematuria.  She notes that the pain does radiate up to her left shoulder.  She denies any direct chest pain or shortness of breath.  She has had no associated fever or chills.  She does admit to a history of kidney stone years ago.   Abdominal Pain Associated symptoms: nausea and vomiting        Prior to Admission medications   Medication Sig Start Date End Date Taking? Authorizing Provider  Blood Glucose Monitoring Suppl DEVI 1 each by Does not apply route in the morning, at noon, and at bedtime. May substitute to any manufacturer covered by patient's insurance. 11/18/23   Severa Rock HERO, FNP  lisinopril  (ZESTRIL ) 20 MG tablet Take 1 tablet (20 mg total) by mouth daily. 06/01/24   Severa Rock HERO, FNP  methocarbamol  (ROBAXIN ) 500 MG tablet Take 1,000 mg by mouth. 07/12/24   [provider]  pravastatin  (PRAVACHOL ) 10 MG tablet Take 1 tablet (10 mg total) by mouth daily. 06/01/24   Severa Rock HERO, FNP  Semaglutide , 2 MG/DOSE, 8 MG/3ML SOPN Inject 2 mg as directed once a week. 09/02/24   Severa Rock HERO, FNP  sertraline  (ZOLOFT ) 100 MG tablet Take 1 tablet (100 mg total) by mouth daily. 09/02/24   Severa Rock HERO, FNP    Allergies: Fish allergy, Shrimp [shellfish allergy], Asa [aspirin], Morpholine salicylate, and Penicillins    Review of Systems  Gastrointestinal:  Positive for abdominal pain, nausea and vomiting.  All other systems reviewed and are negative.   Updated Vital Signs BP 118/73 (BP Location: Right Arm)    Pulse 73   Temp 97.9 F (36.6 C) (Oral)   Resp 17   Ht 5' 5 (1.651 m)   Wt 104.3 kg   LMP  (LMP Unknown)   SpO2 96%   BMI 38.27 kg/m   Physical Exam Vitals and nursing note reviewed.  Constitutional:      General: She is not in acute distress.    Appearance: Normal appearance. She is not ill-appearing.  HENT:     Head: Normocephalic and atraumatic.     Nose: Nose normal.     Mouth/Throat:     Mouth: Mucous membranes are moist.  Eyes:     Extraocular Movements: Extraocular movements intact.     Conjunctiva/sclera: Conjunctivae normal.     Pupils: Pupils are equal, round, and reactive to light.  Cardiovascular:     Rate and Rhythm: Normal rate and regular rhythm.     Pulses: Normal pulses.     Heart sounds: Normal heart sounds. No murmur heard.    No gallop.  Pulmonary:     Effort: Pulmonary effort is normal. No respiratory distress.     Breath sounds: Normal breath sounds. No stridor. No wheezing, rhonchi or rales.  Abdominal:     General: Abdomen is flat. Bowel sounds are normal. There is no distension.     Palpations: Abdomen is soft.     Tenderness: There is abdominal tenderness in the left upper quadrant  and left lower quadrant. Negative signs include Murphy's sign and McBurney's sign.     Hernia: No hernia is present.  Musculoskeletal:        General: Normal range of motion.     Cervical back: Normal range of motion and neck supple.  Skin:    General: Skin is warm and dry.  Neurological:     General: No focal deficit present.     Mental Status: She is alert and oriented to person, place, and time. Mental status is at baseline.  Psychiatric:        Mood and Affect: Mood normal.        Behavior: Behavior normal.        Thought Content: Thought content normal.        Judgment: Judgment normal.     (all labs ordered are listed, but only abnormal results are displayed) Labs Reviewed  COMPREHENSIVE METABOLIC PANEL WITH GFR  LIPASE, BLOOD  CBC WITH  DIFFERENTIAL/PLATELET  URINALYSIS, ROUTINE W REFLEX MICROSCOPIC  TROPONIN T, HIGH SENSITIVITY    EKG: EKG Interpretation Date/Time:  Tuesday November 16 2024 12:17:39 EST Ventricular Rate:  75 PR Interval:  140 QRS Duration:  74 QT Interval:  398 QTC Calculation: 444 R Axis:   79  Text Interpretation: Normal sinus rhythm Normal ECG When compared with ECG of 03-Oct-2017 13:05, No significant change was found Confirmed by Darra Chew [phone]) on 11/16/2024 12:19:35 PM  Radiology: No results found.   Procedures   Medications Ordered in the ED  morphine  (PF) 4 MG/ML injection 4 mg (has no administration in time range)  ondansetron  (ZOFRAN ) injection 4 mg (has no administration in time range)  sodium chloride  0.9 % bolus 1,000 mL (has no administration in time range)                                    Medical Decision Making Amount and/or Complexity of Data Reviewed Labs: ordered. Radiology: ordered.  Risk Prescription drug management.   This patient presents to the ED for concern of abdominal pain, nausea vomiting differential diagnosis includes gastroenteritis, acute appendicitis, cholecystitis, small bowel obstruction, diverticulitis, ovarian torsion or cyst, PID, tubo-ovarian abscess, pyelonephritis, kidney stone, pancreatitis, mesenteric ischemia    Additional history obtained:  Additional history obtained from none External records from outside source obtained and reviewed including none   Lab Tests:  I Ordered, and personally interpreted labs.  The pertinent results include: No leukocytosis, no anemia, chronic thrombocytopenia, unremarkable electrolytes, normal kidney function, normal liver function, negative lipase, unremarkable urinalysis, negative troponin   Imaging Studies ordered:  I ordered imaging studies including CT scan abdomen and pelvis, pelvic ultrasound I independently visualized and interpreted imaging which showed no acute intra-abdominal  surgical process, pelvic ultrasound with right sided para ovarian cyst, no indication for torsion I agree with the radiologist interpretation   Medicines ordered and prescription drug management:  I ordered medication including morphine , Dilaudid , Zofran , Reglan , Benadryl , Protonix , IV fluids for abdominal pain Reevaluation of the patient after these medicines showed that the patient improved I have reviewed the patients home medicines and have made adjustments as needed   Problem List / ED Course:  Patient is feeling much better at this time and is stable for discharge home.  Discussed with patient that her workup in the emergency department has been unremarkable.  Blood work demonstrates no acute findings at this point.  Urinalysis demonstrates no  indication for urinary tract infection.  CT scan of the abdomen and pelvis demonstrated no acute surgical process.  Pelvic ultrasound was obtained which demonstrated no left-sided ovarian cyst but did demonstrate a possible right sided Abran ovarian cyst and possible fallopian tube dilation.  Patient was made aware of these findings as well as the need for close follow-up with gynecology on an outpatient basis for reevaluation.  Patient has had no vaginal discharge or bleeding and no indication for PID at this point.  Patient's symptoms have greatly improved with treatment in the emergency department and she has been monitored for an extended period of time.  She is tolerating p.o. intake without difficulty at this point.  Will continue symptomatic treatment on outpatient basis.  Close follow-up with PCP was discussed.  Strict turn precautions were discussed for any new or worsening symptoms.  Patient voiced understanding and had no additional questions.   Social Determinants of Health:  None        Final diagnoses:  None    ED Discharge Orders     None          Daralene Lonni JONETTA DEVONNA 11/16/24 1757    Darra Fonda MATSU,  MD 11/25/24 [phone]

## 2024-11-16 NOTE — Discharge Instructions (Signed)
 Please follow-up closely with your primary care doctor on an outpatient basis for any continued abdominal pain.  Please follow-up closely with gynecology on an outpatient basis for continued monitoring of the ovarian cyst noted on your ultrasound today.  Return to emergency department immediately for any new or worsening symptoms.

## 2024-11-16 NOTE — ED Triage Notes (Signed)
 Pt c/o L mid abd pain and vomiting that started at 3 am this morning, states she can feel abd pain radiate to L shoulder where she feels a slight pinch, rates pain 8/10, denies shob, denies CP. Pt states she has had a kidney stone before a long time ago.

## 2024-11-16 NOTE — ED Notes (Signed)
 Lab at bedside

## 2024-12-02 ENCOUNTER — Ambulatory Visit: Payer: Self-pay | Admitting: Family Medicine

## 2024-12-05 ENCOUNTER — Other Ambulatory Visit: Payer: Self-pay | Admitting: Family Medicine

## 2024-12-05 DIAGNOSIS — E1169 Type 2 diabetes mellitus with other specified complication: Secondary | ICD-10-CM

## 2024-12-05 DIAGNOSIS — E785 Hyperlipidemia, unspecified: Secondary | ICD-10-CM

## 2024-12-05 DIAGNOSIS — I152 Hypertension secondary to endocrine disorders: Secondary | ICD-10-CM

## 2024-12-17 ENCOUNTER — Other Ambulatory Visit: Payer: Self-pay | Admitting: Family Medicine

## 2024-12-17 ENCOUNTER — Encounter: Payer: Self-pay | Admitting: Family Medicine

## 2024-12-17 DIAGNOSIS — E1169 Type 2 diabetes mellitus with other specified complication: Secondary | ICD-10-CM

## 2024-12-17 DIAGNOSIS — E1159 Type 2 diabetes mellitus with other circulatory complications: Secondary | ICD-10-CM

## 2024-12-17 DIAGNOSIS — K760 Fatty (change of) liver, not elsewhere classified: Secondary | ICD-10-CM

## 2024-12-24 ENCOUNTER — Telehealth: Payer: Self-pay

## 2024-12-24 DIAGNOSIS — E1169 Type 2 diabetes mellitus with other specified complication: Secondary | ICD-10-CM

## 2024-12-24 DIAGNOSIS — E1159 Type 2 diabetes mellitus with other circulatory complications: Secondary | ICD-10-CM

## 2024-12-24 DIAGNOSIS — K760 Fatty (change of) liver, not elsewhere classified: Secondary | ICD-10-CM

## 2024-12-24 NOTE — Telephone Encounter (Signed)
 Looks like patient should have enough medicine to last her until end of March, on all meds except her Ozempic . Okay to send refill?    Copied from CRM #[id number]. Topic: General - Other >> Dec 24, 2024 12:08 PM Santiya F wrote: Reason for CRM: Patient is calling in because she was trying to schedule an appointment with another provider. Informed patient she was discharged from the practice due to missing 3 appointments. Patient says her car broke down right before her last appointment and she called the office and explained, but was still discharged. Patient wants to know how can she get her medications filled and has some other questions. Please follow up with patient.

## 2024-12-27 MED ORDER — OZEMPIC (2 MG/DOSE) 8 MG/3ML ~~LOC~~ SOPN: 2.0000 mg | PEN_INJECTOR | SUBCUTANEOUS | 0 refills | Status: AC

## 2024-12-27 NOTE — Telephone Encounter (Signed)
Medication sent and patient aware  

## 2024-12-29 ENCOUNTER — Ambulatory Visit

## 2024-12-30 ENCOUNTER — Ambulatory Visit: Admitting: Family Medicine

## 2025-01-18 ENCOUNTER — Other Ambulatory Visit (HOSPITAL_COMMUNITY): Payer: Self-pay

## 2025-01-18 ENCOUNTER — Telehealth: Payer: Self-pay | Admitting: Pharmacy Technician

## 2025-01-18 NOTE — Telephone Encounter (Signed)
 Pharmacy Patient Advocate Encounter  Received notification from [facility] that Prior Authorization for Ozempic  (2 MG/DOSE) 8MG /3ML pen-injectors has been APPROVED from 01/04/2025 to 12/15/2098. Ran test claim, Copay is $12.65. This test claim was processed through [facility]- copay amounts may vary at other pharmacies due to pharmacy/plan contracts, or as the patient moves through the different stages of their insurance plan.   PA #/Case ID/Reference #: [id number]

## 2025-01-18 NOTE — Telephone Encounter (Signed)
 Pharmacy Patient Advocate Encounter   Received notification from [employer] that prior authorization for Ozempic  (2 MG/DOSE) 8MG /3ML pen-injectors is required/requested.   Insurance verification completed.   The patient is insured through [employer].   Per test claim: PA required; PA submitted to above mentioned insurance via Latent Key/confirmation #/EOC [facility] Status is pending

## 2025-02-23 ENCOUNTER — Inpatient Hospital Stay

## 2025-02-23 ENCOUNTER — Inpatient Hospital Stay: Admitting: Physician Assistant
